# Patient Record
Sex: Female | Born: 1935 | Race: White | Hispanic: No | Smoking: Never smoker
Health system: Southern US, Community
[De-identification: ages and names within clinical notes are randomized; demographics above are authoritative.]

## PROBLEM LIST (undated history)

## (undated) DIAGNOSIS — H409 Unspecified glaucoma: Secondary | ICD-10-CM

## (undated) DIAGNOSIS — C569 Malignant neoplasm of unspecified ovary: Secondary | ICD-10-CM

## (undated) DIAGNOSIS — C801 Malignant (primary) neoplasm, unspecified: Secondary | ICD-10-CM

## (undated) DIAGNOSIS — R112 Nausea with vomiting, unspecified: Secondary | ICD-10-CM

## (undated) DIAGNOSIS — K219 Gastro-esophageal reflux disease without esophagitis: Secondary | ICD-10-CM

## (undated) DIAGNOSIS — I10 Essential (primary) hypertension: Secondary | ICD-10-CM

## (undated) DIAGNOSIS — Z9889 Other specified postprocedural states: Secondary | ICD-10-CM

## (undated) DIAGNOSIS — F419 Anxiety disorder, unspecified: Secondary | ICD-10-CM

## (undated) DIAGNOSIS — E7800 Pure hypercholesterolemia, unspecified: Secondary | ICD-10-CM

## (undated) HISTORY — DX: Gastro-esophageal reflux disease without esophagitis: K21.9

## (undated) HISTORY — DX: Unspecified glaucoma: H40.9

## (undated) HISTORY — DX: Malignant neoplasm of unspecified ovary: C56.9

## (undated) HISTORY — DX: Pure hypercholesterolemia, unspecified: E78.00

## (undated) HISTORY — PX: HAND SURGERY: SHX662

## (undated) HISTORY — PX: REPLACEMENT TOTAL KNEE: SUR1224

## (undated) HISTORY — PX: ABDOMINAL HYSTERECTOMY: SHX81

## (undated) HISTORY — PX: BREAST BIOPSY: SHX20

---

## 2004-10-30 ENCOUNTER — Ambulatory Visit: Payer: Self-pay | Admitting: Internal Medicine

## 2005-09-12 ENCOUNTER — Ambulatory Visit: Payer: Self-pay | Admitting: Internal Medicine

## 2005-10-31 ENCOUNTER — Ambulatory Visit: Payer: Self-pay | Admitting: Internal Medicine

## 2006-05-20 ENCOUNTER — Emergency Department: Payer: Self-pay | Admitting: Emergency Medicine

## 2006-05-20 ENCOUNTER — Other Ambulatory Visit: Payer: Self-pay

## 2006-12-23 ENCOUNTER — Ambulatory Visit: Payer: Self-pay | Admitting: Internal Medicine

## 2007-05-15 ENCOUNTER — Ambulatory Visit: Payer: Self-pay | Admitting: Otolaryngology

## 2008-02-09 ENCOUNTER — Ambulatory Visit: Payer: Self-pay | Admitting: Internal Medicine

## 2008-07-29 ENCOUNTER — Ambulatory Visit: Payer: Self-pay | Admitting: Ophthalmology

## 2008-07-29 ENCOUNTER — Other Ambulatory Visit: Payer: Self-pay

## 2008-08-16 ENCOUNTER — Ambulatory Visit: Payer: Self-pay | Admitting: Ophthalmology

## 2008-10-20 ENCOUNTER — Ambulatory Visit: Payer: Self-pay | Admitting: Internal Medicine

## 2008-11-22 ENCOUNTER — Ambulatory Visit: Payer: Self-pay | Admitting: Ophthalmology

## 2009-02-10 ENCOUNTER — Ambulatory Visit: Payer: Self-pay | Admitting: Internal Medicine

## 2010-02-13 ENCOUNTER — Ambulatory Visit: Payer: Self-pay | Admitting: Internal Medicine

## 2010-04-24 ENCOUNTER — Ambulatory Visit: Payer: Self-pay | Admitting: Orthopedic Surgery

## 2010-05-02 ENCOUNTER — Inpatient Hospital Stay: Payer: Self-pay | Admitting: Orthopedic Surgery

## 2010-05-07 ENCOUNTER — Encounter: Payer: Self-pay | Admitting: Internal Medicine

## 2010-05-19 ENCOUNTER — Encounter: Payer: Self-pay | Admitting: Internal Medicine

## 2010-06-12 ENCOUNTER — Emergency Department: Payer: Self-pay | Admitting: Emergency Medicine

## 2011-02-27 ENCOUNTER — Ambulatory Visit: Payer: Self-pay | Admitting: Internal Medicine

## 2012-04-28 ENCOUNTER — Ambulatory Visit: Payer: Self-pay | Admitting: Internal Medicine

## 2012-09-26 ENCOUNTER — Ambulatory Visit: Payer: Self-pay | Admitting: Internal Medicine

## 2013-05-18 ENCOUNTER — Ambulatory Visit: Payer: Self-pay | Admitting: Internal Medicine

## 2014-06-07 ENCOUNTER — Ambulatory Visit: Payer: Self-pay | Admitting: Internal Medicine

## 2014-08-17 DIAGNOSIS — R221 Localized swelling, mass and lump, neck: Secondary | ICD-10-CM

## 2014-08-17 DIAGNOSIS — R220 Localized swelling, mass and lump, head: Secondary | ICD-10-CM | POA: Insufficient documentation

## 2015-06-20 DIAGNOSIS — C569 Malignant neoplasm of unspecified ovary: Secondary | ICD-10-CM

## 2015-06-20 HISTORY — DX: Malignant neoplasm of unspecified ovary: C56.9

## 2015-07-01 ENCOUNTER — Other Ambulatory Visit: Payer: Self-pay | Admitting: Internal Medicine

## 2015-07-01 DIAGNOSIS — R1030 Lower abdominal pain, unspecified: Secondary | ICD-10-CM

## 2015-07-04 ENCOUNTER — Ambulatory Visit
Admission: RE | Admit: 2015-07-04 | Discharge: 2015-07-04 | Disposition: A | Discharge status: Home / Self Care | Payer: Medicare Other | Source: Ambulatory Visit | Attending: Internal Medicine | Admitting: Internal Medicine

## 2015-07-04 DIAGNOSIS — R1030 Lower abdominal pain, unspecified: Secondary | ICD-10-CM

## 2015-07-04 DIAGNOSIS — N8320 Unspecified ovarian cysts: Secondary | ICD-10-CM | POA: Diagnosis not present

## 2015-07-11 ENCOUNTER — Telehealth: Payer: Self-pay | Admitting: *Deleted

## 2015-07-11 NOTE — Telephone Encounter (Signed)
Patient called regarding "what to expect at her appointment with Dr. Theora Gianotti."  Patient reassured. Explained that this appointment, md will perform an internal pelvic exam and consultation." pt states that she is "going out of town on Thursday for two days and was worried that the doctor would be planning surgery that soon." pt reassured that she can still keep her plans for her trip at this time. Pt grateful for the information and reassurance.

## 2015-07-13 ENCOUNTER — Encounter
Admission: RE | Admit: 2015-07-13 | Discharge: 2015-07-13 | Disposition: A | Discharge status: Home / Self Care | Payer: Medicare Other | Source: Ambulatory Visit | Attending: Obstetrics and Gynecology | Admitting: Obstetrics and Gynecology

## 2015-07-13 ENCOUNTER — Encounter: Payer: Self-pay | Admitting: Obstetrics and Gynecology

## 2015-07-13 ENCOUNTER — Other Ambulatory Visit: Payer: Self-pay

## 2015-07-13 ENCOUNTER — Ambulatory Visit
Admission: RE | Admit: 2015-07-13 | Discharge: 2015-07-13 | Disposition: A | Discharge status: Home / Self Care | Payer: Medicare Other | Source: Ambulatory Visit | Attending: Obstetrics and Gynecology | Admitting: Obstetrics and Gynecology

## 2015-07-13 ENCOUNTER — Inpatient Hospital Stay: Payer: Medicare Other | Attending: Obstetrics and Gynecology | Admitting: Obstetrics and Gynecology

## 2015-07-13 VITALS — BP 118/80 | HR 81 | Temp 98.0°F | Ht 59.0 in | Wt 129.7 lb

## 2015-07-13 DIAGNOSIS — Z01810 Encounter for preprocedural cardiovascular examination: Secondary | ICD-10-CM | POA: Insufficient documentation

## 2015-07-13 DIAGNOSIS — R1909 Other intra-abdominal and pelvic swelling, mass and lump: Secondary | ICD-10-CM | POA: Insufficient documentation

## 2015-07-13 DIAGNOSIS — N839 Noninflammatory disorder of ovary, fallopian tube and broad ligament, unspecified: Secondary | ICD-10-CM | POA: Diagnosis not present

## 2015-07-13 DIAGNOSIS — N838 Other noninflammatory disorders of ovary, fallopian tube and broad ligament: Secondary | ICD-10-CM

## 2015-07-13 DIAGNOSIS — Z79899 Other long term (current) drug therapy: Secondary | ICD-10-CM | POA: Insufficient documentation

## 2015-07-13 DIAGNOSIS — Z01818 Encounter for other preprocedural examination: Secondary | ICD-10-CM

## 2015-07-13 DIAGNOSIS — Z90710 Acquired absence of both cervix and uterus: Secondary | ICD-10-CM | POA: Insufficient documentation

## 2015-07-13 HISTORY — DX: Other specified postprocedural states: Z98.890

## 2015-07-13 HISTORY — DX: Nausea with vomiting, unspecified: R11.2

## 2015-07-13 LAB — COMPREHENSIVE METABOLIC PANEL
ALK PHOS: 63 U/L (ref 38–126)
ALT: 13 U/L — AB (ref 14–54)
ANION GAP: 10 (ref 5–15)
AST: 26 U/L (ref 15–41)
Albumin: 4.5 g/dL (ref 3.5–5.0)
BILIRUBIN TOTAL: 0.9 mg/dL (ref 0.3–1.2)
BUN: 16 mg/dL (ref 6–20)
CALCIUM: 9.8 mg/dL (ref 8.9–10.3)
CO2: 28 mmol/L (ref 22–32)
CREATININE: 0.79 mg/dL (ref 0.44–1.00)
Chloride: 100 mmol/L — ABNORMAL LOW (ref 101–111)
Glucose, Bld: 100 mg/dL — ABNORMAL HIGH (ref 65–99)
Potassium: 4 mmol/L (ref 3.5–5.1)
SODIUM: 138 mmol/L (ref 135–145)
TOTAL PROTEIN: 8.6 g/dL — AB (ref 6.5–8.1)

## 2015-07-13 LAB — CBC
HCT: 39.2 % (ref 35.0–47.0)
HEMOGLOBIN: 12.5 g/dL (ref 12.0–16.0)
MCH: 28 pg (ref 26.0–34.0)
MCHC: 31.9 g/dL — AB (ref 32.0–36.0)
MCV: 87.7 fL (ref 80.0–100.0)
Platelets: 256 10*3/uL (ref 150–440)
RBC: 4.48 MIL/uL (ref 3.80–5.20)
RDW: 13.9 % (ref 11.5–14.5)
WBC: 10.1 10*3/uL (ref 3.6–11.0)

## 2015-07-13 LAB — TYPE AND SCREEN
ABO/RH(D): O POS
ANTIBODY SCREEN: NEGATIVE

## 2015-07-13 NOTE — Progress Notes (Signed)
Gynecologic Oncology Consult Visit   Referring Provider: Dr. Hall Busing  Chief Concern: ovarian cyst  Subjective:  Kathryn Chandler is a 81 y.o. female who is seen in consultation from Dr. Hall Busing for ovarian mass. She presented with abdominal pain.    Ultrasound 07/04/2015  FINDINGS: Uterus:Prior hysterectomy. Right ovary: Not visualized. Left ovary: There is a large septated cystic left adnexal mass measuring 12.8 x 9.6 x 13.9 cm. There is a 15 mm mural nodule. There is Doppler flow in 1 of the septations. There is no pelvic free fluid.  Pertinent positive ROS includes change in stool caliber and form since July. She had a colonoscopy years ago which she says was normal. I was not able to locate the report, but it appears that the procedure may have been done 15 years ago.   06/07/2014 Mammogram IMPRESSION: No mammographic evidence of malignancy. A result letter of this screening mammogram will be mailed directly to the patient.   Problem List: Patient Active Problem List   Diagnosis Date Noted  . Ovarian mass, left 07/13/2015    Past Medical History: Past Medical History  Diagnosis Date  . Glaucoma     Past Surgical History: Past Surgical History  Procedure Laterality Date  . Abdominal hysterectomy    . Replacement total knee      Right  . Breast biopsy    . Hand surgery      Right    Past Gynecologic History:  Menopause: 1976    OB History:  OB History  Gravida Para Term Preterm AB SAB TAB Ectopic Multiple Living  3 1   2 2         # Outcome Date GA Lbr Len/2nd Weight Sex Delivery Anes PTL Lv  3 Para           2 SAB           1 SAB               Family History: History reviewed. No pertinent family history.  Social History: Social History   Social History  . Marital Status: Married    Spouse Name: N/A  . Number of Children: N/A  . Years of Education: N/A   Occupational History  .      Retired   Social History Main Topics  . Smoking status: Not on  file  . Smokeless tobacco: Not on file  . Alcohol Use: Not on file  . Drug Use: Not on file  . Sexual Activity: Not on file   Other Topics Concern  . Not on file   Social History Narrative    Allergies: Allergies  Allergen Reactions  . Clindamycin/Lincomycin Diarrhea  . Codeine Anxiety    Current Medications: Current Outpatient Prescriptions  Medication Sig Dispense Refill  . acetaminophen (TYLENOL) 500 MG tablet Take by mouth.    . Ascorbic Acid (VITAMIN C CR) 500 MG CPCR Take by mouth.    . bismuth subsalicylate (PEPTO BISMOL) 262 MG chewable tablet Chew by mouth.    . fluticasone (FLONASE) 50 MCG/ACT nasal spray Place into the nose.    Marland Kitchen LORazepam (ATIVAN) 0.5 MG tablet Take by mouth.    . LUMIGAN 0.01 % SOLN     . meloxicam (MOBIC) 15 MG tablet Take by mouth.    Marland Kitchen omeprazole (PRILOSEC) 20 MG capsule Take by mouth.    . simvastatin (ZOCOR) 20 MG tablet Take by mouth.     No current facility-administered medications for this visit.  Review of Systems General: Weight loss  HEENT: no complaints  Lungs: no complaints  Cardiac: no complaints  GI: no complaints  GU: no complaints  Musculoskeletal: no complaints  Extremities: no complaints  Skin: no complaints  Neuro: no complaints  Endocrine: no complaints  Psych: no complaints      Objective:  Physical Examination:  BP 118/80 mmHg  Pulse 81  Temp(Src) 98 F (36.7 C) (Oral)  Ht 4\' 11"  (1.499 m)  Wt 129 lb 11.9 oz (58.85 kg)  BMI 26.19 kg/m2   ECOG Performance Status: 0 - Asymptomatic  General appearance: alert, cooperative and appears stated age HEENT:PERRLA, extra ocular movement intact and sclera clear, anicteric Lymph node survey: non-palpable, axillary, inguinal, supraclavicular Cardiovascular: regular rate and rhythm Respiratory: normal air entry, lungs clear to auscultation Breast exam: breasts appear normal, no suspicious masses, no skin or nipple changes or axillary nodes. Abdomen: soft,  protuberant, normal bowel sounds, mass located in the LLQ with extension to the right, no hepatosplenomegaly, no hernias and well healed incision Back: inspection of back is normal Extremities: extremities normal, atraumatic, no cyanosis or edema Neurological exam reveals alert, oriented, normal speech, no focal findings or movement disorder noted.  Pelvic: exam chaperoned by nurse;  Vulva: normal appearing vulva with no masses, tenderness or lesions; Vagina: normal vagina; Adnexa: mass present left side, size 12 cm; Uterus/cervix: surgically absent, vaginal cuff well healed; Rectal: confirmatory. Stool present no evidence of bowel involvement with the mass.   Lab Review Labs ordered   Radiologic Imaging: Reviewed films personally    Assessment:  Kathryn Chandler is a 81 y.o. female diagnosed with symptomatic complext adnexal mass concerning for malignancy. Medical co-morbidities complicating care: prior abdominal surgery.  Plan:   Problem List Items Addressed This Visit      Other   Ovarian mass, left - Primary      We discussed options for management including and I recommend definitive surgical evaluation. I will order her CEA, CA125, and HE4 in addition to her other studies (CXR and EKG).   Risks were discussed in detail. These include infection, anesthesia, bleeding, transfusion, wound separation, medical issues (blood clots, stroke, heart attack, fluid in the lungs, pneumonia, abnormal heart rhythm, death), possible exploratory surgery with larger incision, lymphedema, lymphocyst, allergic reaction.  Suggested return to clinic in  4 weeks.    The patient's diagnosis, an outline of the further diagnostic and laboratory studies which will be required, the recommendation, and alternatives were discussed.  All questions were answered to the patient's satisfaction.  A total of 120 minutes combined were spent with the patient/family today; 40% was spent in education, counseling and  coordination of care for complex adnexal mass.    Gillis Ends, MD    CC:  Larey Days, MD  Albina Billet, MD [address]   [place], [place] [address] [phone]

## 2015-07-13 NOTE — Addendum Note (Signed)
Addended by: Gillis Ends on: 07/13/2015 10:48 AM   Modules accepted: Orders

## 2015-07-13 NOTE — H&P (Signed)
Gynecologic Oncology Consult Visit   Referring Provider: Dr. Hall Busing  Chief Concern: ovarian cyst  Subjective:  Kathryn Chandler is a 81 y.o. female who is seen in consultation from Dr. Hall Busing for ovarian mass. She presented with abdominal pain.    Ultrasound 07/04/2015  FINDINGS: Uterus:Prior hysterectomy. Right ovary: Not visualized. Left ovary: There is a large septated cystic left adnexal mass measuring 12.8 x 9.6 x 13.9 cm. There is a 15 mm mural nodule. There is Doppler flow in 1 of the septations. There is no pelvic free fluid.  Pertinent positive ROS includes change in stool caliber and form since July. She had a colonoscopy years ago which she says was normal. I was not able to locate the report, but it appears that the procedure may have been done 15 years ago.   06/07/2014 Mammogram IMPRESSION: No mammographic evidence of malignancy. A result letter of this screening mammogram will be mailed directly to the patient.   Problem List: Patient Active Problem List   Diagnosis Date Noted  . Ovarian mass, left 07/13/2015    Past Medical History: Past Medical History  Diagnosis Date  . Glaucoma     Past Surgical History: Past Surgical History  Procedure Laterality Date  . Abdominal hysterectomy    . Replacement total knee      Right  . Breast biopsy    . Hand surgery      Right    Past Gynecologic History:  Menopause: 1976    OB History:  OB History  Gravida Para Term Preterm AB SAB TAB Ectopic Multiple Living  3 1   2 2         # Outcome Date GA Lbr Len/2nd Weight Sex Delivery Anes PTL Lv  3 Para           2 SAB           1 SAB               Family History: History reviewed. No pertinent family history.  Social History: Social History   Social History  . Marital Status: Married    Spouse Name: N/A  . Number of Children: N/A  .  Years of Education: N/A   Occupational History  .      Retired   Social History Main Topics  . Smoking status: Not on file  . Smokeless tobacco: Not on file  . Alcohol Use: Not on file  . Drug Use: Not on file  . Sexual Activity: Not on file   Other Topics Concern  . Not on file   Social History Narrative    Allergies: Allergies  Allergen Reactions  . Clindamycin/Lincomycin Diarrhea  . Codeine Anxiety    Current Medications: Current Outpatient Prescriptions  Medication Sig Dispense Refill  . acetaminophen (TYLENOL) 500 MG tablet Take by mouth.    . Ascorbic Acid (VITAMIN C CR) 500 MG CPCR Take by mouth.    . bismuth subsalicylate (PEPTO BISMOL) 262 MG chewable tablet Chew by mouth.    . fluticasone (FLONASE) 50 MCG/ACT nasal spray Place into the nose.    Marland Kitchen LORazepam (ATIVAN) 0.5 MG tablet Take by mouth.    . LUMIGAN 0.01 % SOLN     . meloxicam (MOBIC) 15 MG tablet Take by mouth.    Marland Kitchen omeprazole (PRILOSEC) 20 MG capsule Take by mouth.    . simvastatin (ZOCOR) 20 MG tablet Take by mouth.     No current facility-administered medications for this visit.  Review of Systems General: Weight loss  HEENT: no complaints  Lungs: no complaints  Cardiac: no complaints  GI: no complaints  GU: no complaints  Musculoskeletal: no complaints  Extremities: no complaints  Skin: no complaints  Neuro: no complaints  Endocrine: no complaints  Psych: no complaints      Objective:  Physical Examination:  BP 118/80 mmHg  Pulse 81  Temp(Src) 98 F (36.7 C) (Oral)  Ht 4\' 11"  (1.499 m)  Wt 129 lb 11.9 oz (58.85 kg)  BMI 26.19 kg/m2  ECOG Performance Status: 0 - Asymptomatic  General appearance: alert, cooperative and appears stated age HEENT:PERRLA, extra ocular movement intact and sclera clear, anicteric Lymph node survey: non-palpable, axillary,  inguinal, supraclavicular Cardiovascular: regular rate and rhythm Respiratory: normal air entry, lungs clear to auscultation Breast exam: breasts appear normal, no suspicious masses, no skin or nipple changes or axillary nodes. Abdomen: soft, protuberant, normal bowel sounds, mass located in the LLQ with extension to the right, no hepatosplenomegaly, no hernias and well healed incision Back: inspection of back is normal Extremities: extremities normal, atraumatic, no cyanosis or edema Neurological exam reveals alert, oriented, normal speech, no focal findings or movement disorder noted.  Pelvic: exam chaperoned by nurse; Vulva: normal appearing vulva with no masses, tenderness or lesions; Vagina: normal vagina; Adnexa: mass present left side, size 12 cm; Uterus/cervix: surgically absent, vaginal cuff well healed; Rectal: confirmatory. Stool present no evidence of bowel involvement with the mass.   Lab Review Labs ordered   Radiologic Imaging: Reviewed films personally  Assessment:  Kathryn Chandler is a 81 y.o. female diagnosed with symptomatic complext adnexal mass concerning for malignancy. Medical co-morbidities complicating care: prior abdominal surgery.  Plan:   Problem List Items Addressed This Visit      Other   Ovarian mass, left - Primary      We discussed options for management including and I recommend definitive surgical evaluation. I will order her CEA, CA125, and HE4 in addition to her other studies (CXR and EKG).   Risks were discussed in detail. These include infection, anesthesia, bleeding, transfusion, wound separation, medical issues (blood clots, stroke, heart attack, fluid in the lungs, pneumonia, abnormal heart rhythm, death), possible exploratory surgery with larger incision, lymphedema, lymphocyst, allergic reaction.  Suggested return to clinic in 4 weeks.   The patient's diagnosis, an outline of the further diagnostic and laboratory studies  which will be required, the recommendation, and alternatives were discussed. All questions were answered to the patient's satisfaction.  A total of 120 minutes combined were spent with the patient/family today; 40% was spent in education, counseling and coordination of care for complex adnexal mass.   Gillis Ends, MD    CC:  Larey Days, MD  Albina Billet, MD [address] [place], [place] [address] [phone]

## 2015-07-13 NOTE — Patient Instructions (Signed)
  Your procedure is scheduled on: Wednesday 07/20/2015 Report to Day Surgery. 2ND FLOOR MEDICAL MALL ENTRANCE To find out your arrival time please call [phone] between 1PM - 3PM on Tuesday 07/19/2015.  Remember: Instructions that are not followed completely may result in serious medical risk, up to and including death, or upon the discretion of your surgeon and anesthesiologist your surgery may need to be rescheduled.    __X__ 1. Do not eat food or drink liquids after midnight. No gum chewing or hard candies.     __X__ 2. No Alcohol for 24 hours before or after surgery.   ____ 3. Bring all medications with you on the day of surgery if instructed.    __X__ 4. Notify your doctor if there is any change in your medical condition     (cold, fever, infections).     Do not wear jewelry, make-up, hairpins, clips or nail polish.  Do not wear lotions, powders, or perfumes.   Do not shave 48 hours prior to surgery. Men may shave face and neck.  Do not bring valuables to the hospital.    [facility] is not responsible for any belongings or valuables.               Contacts, dentures or bridgework may not be worn into surgery.  Leave your suitcase in the car. After surgery it may be brought to your room.  For patients admitted to the hospital, discharge time is determined by your                treatment team.   Patients discharged the day of surgery will not be allowed to drive home.   Please read over the following fact sheets that you were given:   Surgical Site Infection Prevention   ____ Take these medicines the morning of surgery with A SIP OF WATER:    1. LORAZEPAM  2. OMEPRAZOLE  3.   4.  5.  6.  ____ Fleet Enema (as directed)   __X__ Use CHG Soap as directed  ____ Use inhalers on the day of surgery  ____ Stop metformin 2 days prior to surgery    ____ Take 1/2 of usual insulin dose the night before surgery and none on the morning of surgery.   ____ Stop  Coumadin/Plavix/aspirin on   __X__ Stop Anti-inflammatories on 07/14/2015   __X__ Stop supplements until after surgery.  VITAMIN C, NO PEPTO BISMOL  ____ Bring C-Pap to the hospital.

## 2015-07-14 ENCOUNTER — Ambulatory Visit: Payer: Medicare (Managed Care) | Admitting: Oncology

## 2015-07-14 ENCOUNTER — Telehealth: Payer: Self-pay | Admitting: *Deleted

## 2015-07-14 LAB — ABO/RH: ABO/RH(D): O POS

## 2015-07-14 NOTE — Telephone Encounter (Signed)
On chart review, RN noticed that patient did not get tumor markers drawn yesterday at preadmit testing per md order. I have left a msg with the patient's son-Kathryn Chandler to seek an alternative phone number for patient. Pt is out of town as she is going to [place], [place] for vacation. Ideally, would like the patient to come back to today for this lab draw, but that is not possible as patient is traveling. At the latest, the patient needs to come on Monday for lab draw.   Dr. Theora Gianotti notified that labs were not drawn as scheduled.

## 2015-07-18 ENCOUNTER — Inpatient Hospital Stay: Payer: Medicare Other

## 2015-07-18 ENCOUNTER — Inpatient Hospital Stay: Payer: Medicare Other | Admitting: Oncology

## 2015-07-18 DIAGNOSIS — N838 Other noninflammatory disorders of ovary, fallopian tube and broad ligament: Secondary | ICD-10-CM

## 2015-07-19 LAB — CA 125: CA 125: 74.2 U/mL — ABNORMAL HIGH (ref 0.0–38.1)

## 2015-07-19 LAB — CEA: CEA: 2.7 ng/mL (ref 0.0–4.7)

## 2015-07-20 ENCOUNTER — Encounter
Admission: AD | Disposition: A | Discharge status: Skilled Nursing Facility | Payer: Self-pay | Source: Ambulatory Visit | Attending: Obstetrics & Gynecology

## 2015-07-20 ENCOUNTER — Ambulatory Visit: Payer: Medicare Other | Admitting: Anesthesiology

## 2015-07-20 ENCOUNTER — Encounter: Payer: Self-pay | Admitting: *Deleted

## 2015-07-20 ENCOUNTER — Inpatient Hospital Stay
Admission: AD | Admit: 2015-07-20 | Discharge: 2015-07-24 | DRG: 738 | Disposition: A | Discharge status: Skilled Nursing Facility | Payer: Medicare Other | Source: Ambulatory Visit | Attending: Obstetrics & Gynecology | Admitting: Obstetrics & Gynecology

## 2015-07-20 DIAGNOSIS — R001 Bradycardia, unspecified: Secondary | ICD-10-CM | POA: Diagnosis not present

## 2015-07-20 DIAGNOSIS — E780 Pure hypercholesterolemia: Secondary | ICD-10-CM | POA: Diagnosis present

## 2015-07-20 DIAGNOSIS — H409 Unspecified glaucoma: Secondary | ICD-10-CM | POA: Diagnosis present

## 2015-07-20 DIAGNOSIS — C562 Malignant neoplasm of left ovary: Principal | ICD-10-CM

## 2015-07-20 DIAGNOSIS — K219 Gastro-esophageal reflux disease without esophagitis: Secondary | ICD-10-CM | POA: Diagnosis present

## 2015-07-20 DIAGNOSIS — C569 Malignant neoplasm of unspecified ovary: Secondary | ICD-10-CM | POA: Diagnosis present

## 2015-07-20 DIAGNOSIS — N736 Female pelvic peritoneal adhesions (postinfective): Secondary | ICD-10-CM | POA: Diagnosis present

## 2015-07-20 DIAGNOSIS — N838 Other noninflammatory disorders of ovary, fallopian tube and broad ligament: Secondary | ICD-10-CM

## 2015-07-20 HISTORY — PX: SALPINGOOPHORECTOMY: SHX82

## 2015-07-20 HISTORY — PX: LAPAROTOMY: SHX154

## 2015-07-20 LAB — CBC
HCT: 35.7 % (ref 35.0–47.0)
HEMOGLOBIN: 11.3 g/dL — AB (ref 12.0–16.0)
MCH: 27.7 pg (ref 26.0–34.0)
MCHC: 31.6 g/dL — ABNORMAL LOW (ref 32.0–36.0)
MCV: 87.8 fL (ref 80.0–100.0)
Platelets: 214 10*3/uL (ref 150–440)
RBC: 4.06 MIL/uL (ref 3.80–5.20)
RDW: 14 % (ref 11.5–14.5)
WBC: 13.9 10*3/uL — ABNORMAL HIGH (ref 3.6–11.0)

## 2015-07-20 LAB — CREATININE, SERUM: CREATININE: 0.69 mg/dL (ref 0.44–1.00)

## 2015-07-20 SURGERY — LAPAROTOMY
Anesthesia: General | Site: Abdomen | Laterality: Right | Wound class: Clean

## 2015-07-20 MED ORDER — CEFAZOLIN SODIUM-DEXTROSE 2-3 GM-% IV SOLR
2.0000 g | INTRAVENOUS | Status: AC
  Administered 2015-07-20: 2 g via INTRAVENOUS

## 2015-07-20 MED ORDER — PANTOPRAZOLE SODIUM 40 MG PO TBEC
40.0000 mg | DELAYED_RELEASE_TABLET | Freq: Every day | ORAL | Status: DC
  Administered 2015-07-21 – 2015-07-23 (×3): 40 mg via ORAL
  Filled 2015-07-20 (×3): qty 1

## 2015-07-20 MED ORDER — NEOSTIGMINE METHYLSULFATE 10 MG/10ML IV SOLN
INTRAVENOUS | Status: DC | PRN
  Administered 2015-07-20: 2.5 mg via INTRAVENOUS

## 2015-07-20 MED ORDER — EPHEDRINE SULFATE 50 MG/ML IJ SOLN
INTRAMUSCULAR | Status: DC | PRN
  Administered 2015-07-20: 10 mg via INTRAVENOUS

## 2015-07-20 MED ORDER — OXYCODONE HCL 5 MG PO TABS: 10.0000 mg | ORAL_TABLET | ORAL | Status: DC | PRN

## 2015-07-20 MED ORDER — ENOXAPARIN SODIUM 40 MG/0.4ML ~~LOC~~ SOLN
40.0000 mg | SUBCUTANEOUS | Status: AC
  Administered 2015-07-20: 40 mg via SUBCUTANEOUS
  Filled 2015-07-20: qty 0.4

## 2015-07-20 MED ORDER — ONDANSETRON HCL 4 MG PO TABS: 4.0000 mg | ORAL_TABLET | Freq: Four times a day (QID) | ORAL | Status: DC | PRN

## 2015-07-20 MED ORDER — LORAZEPAM 0.5 MG PO TABS
0.5000 mg | ORAL_TABLET | Freq: Two times a day (BID) | ORAL | Status: DC
  Administered 2015-07-20 – 2015-07-24 (×8): 0.5 mg via ORAL
  Filled 2015-07-20 (×8): qty 1

## 2015-07-20 MED ORDER — METOCLOPRAMIDE HCL 5 MG/ML IJ SOLN: 10.0000 mg | Freq: Once | INTRAMUSCULAR | Status: DC | PRN

## 2015-07-20 MED ORDER — ROCURONIUM BROMIDE 100 MG/10ML IV SOLN
INTRAVENOUS | Status: DC | PRN
  Administered 2015-07-20: 40 mg via INTRAVENOUS
  Administered 2015-07-20: 10 mg via INTRAVENOUS

## 2015-07-20 MED ORDER — GLYCOPYRROLATE 0.2 MG/ML IJ SOLN
INTRAMUSCULAR | Status: DC | PRN
  Administered 2015-07-20: 0.4 mg via INTRAVENOUS
  Administered 2015-07-20: .4 mg via INTRAVENOUS

## 2015-07-20 MED ORDER — ACETAMINOPHEN 10 MG/ML IV SOLN
INTRAVENOUS | Status: DC | PRN
  Administered 2015-07-20: 1000 mg via INTRAVENOUS

## 2015-07-20 MED ORDER — BUPIVACAINE HCL (PF) 0.5 % IJ SOLN
INTRAMUSCULAR | Status: AC
  Filled 2015-07-20: qty 30

## 2015-07-20 MED ORDER — LACTATED RINGERS IV SOLN
INTRAVENOUS | Status: DC
  Administered 2015-07-20 (×3): via INTRAVENOUS

## 2015-07-20 MED ORDER — METHYLENE BLUE 1 % INJ SOLN
INTRAMUSCULAR | Status: AC
  Filled 2015-07-20: qty 10

## 2015-07-20 MED ORDER — FENTANYL CITRATE (PF) 100 MCG/2ML IJ SOLN
25.0000 ug | INTRAMUSCULAR | Status: DC | PRN
  Administered 2015-07-20 (×4): 25 ug via INTRAVENOUS

## 2015-07-20 MED ORDER — SIMETHICONE 80 MG PO CHEW: 80.0000 mg | CHEWABLE_TABLET | Freq: Four times a day (QID) | ORAL | Status: DC | PRN

## 2015-07-20 MED ORDER — SCOPOLAMINE 1 MG/3DAYS TD PT72: 1.0000 | MEDICATED_PATCH | TRANSDERMAL | Status: DC | PRN

## 2015-07-20 MED ORDER — IBUPROFEN 600 MG PO TABS
600.0000 mg | ORAL_TABLET | Freq: Four times a day (QID) | ORAL | Status: DC
Start: 2015-07-20 — End: 2015-07-24
  Administered 2015-07-20 – 2015-07-24 (×14): 600 mg via ORAL
  Filled 2015-07-20 (×15): qty 1

## 2015-07-20 MED ORDER — HYDROMORPHONE HCL 1 MG/ML IJ SOLN
INTRAMUSCULAR | Status: AC
  Filled 2015-07-20: qty 1

## 2015-07-20 MED ORDER — PROPOFOL 10 MG/ML IV BOLUS
INTRAVENOUS | Status: DC | PRN
  Administered 2015-07-20: 80 mg via INTRAVENOUS

## 2015-07-20 MED ORDER — FENTANYL CITRATE (PF) 100 MCG/2ML IJ SOLN
INTRAMUSCULAR | Status: AC
  Filled 2015-07-20: qty 2

## 2015-07-20 MED ORDER — SODIUM CHLORIDE 0.9 % IV SOLN
INTRAVENOUS | Status: DC
  Administered 2015-07-20: 20:00:00 via INTRAVENOUS

## 2015-07-20 MED ORDER — ACETAMINOPHEN 325 MG PO TABS
650.0000 mg | ORAL_TABLET | Freq: Four times a day (QID) | ORAL | Status: DC
  Administered 2015-07-20 – 2015-07-24 (×11): 650 mg via ORAL
  Filled 2015-07-20 (×10): qty 2

## 2015-07-20 MED ORDER — PHENYLEPHRINE HCL 10 MG/ML IJ SOLN
INTRAMUSCULAR | Status: DC | PRN
  Administered 2015-07-20: 100 ug via INTRAVENOUS

## 2015-07-20 MED ORDER — LIDOCAINE HCL (CARDIAC) 20 MG/ML IV SOLN
INTRAVENOUS | Status: DC | PRN
  Administered 2015-07-20: 60 mg via INTRAVENOUS

## 2015-07-20 MED ORDER — CEFAZOLIN SODIUM-DEXTROSE 2-3 GM-% IV SOLR
INTRAVENOUS | Status: AC
  Filled 2015-07-20: qty 50

## 2015-07-20 MED ORDER — OXYCODONE HCL 5 MG PO TABS: 5.0000 mg | ORAL_TABLET | ORAL | Status: DC | PRN

## 2015-07-20 MED ORDER — TRAMADOL HCL 50 MG PO TABS
50.0000 mg | ORAL_TABLET | Freq: Four times a day (QID) | ORAL | Status: DC
  Administered 2015-07-20 – 2015-07-24 (×15): 50 mg via ORAL
  Filled 2015-07-20 (×15): qty 1

## 2015-07-20 MED ORDER — DEXAMETHASONE SODIUM PHOSPHATE 4 MG/ML IJ SOLN
INTRAMUSCULAR | Status: DC | PRN
  Administered 2015-07-20: 10 mg via INTRAVENOUS

## 2015-07-20 MED ORDER — MENTHOL 3 MG MT LOZG
1.0000 | LOZENGE | OROMUCOSAL | Status: DC | PRN
  Filled 2015-07-20: qty 9

## 2015-07-20 MED ORDER — FENTANYL CITRATE (PF) 100 MCG/2ML IJ SOLN
INTRAMUSCULAR | Status: DC | PRN
  Administered 2015-07-20 (×3): 50 ug via INTRAVENOUS
  Administered 2015-07-20 (×2): 25 ug via INTRAVENOUS

## 2015-07-20 MED ORDER — ACETAMINOPHEN 10 MG/ML IV SOLN
INTRAVENOUS | Status: AC
Start: 2015-07-20 — End: 2015-07-20
  Filled 2015-07-20: qty 100

## 2015-07-20 MED ORDER — ONDANSETRON HCL 4 MG/2ML IJ SOLN
INTRAMUSCULAR | Status: DC | PRN
  Administered 2015-07-20: 4 mg via INTRAVENOUS

## 2015-07-20 MED ORDER — KETAMINE HCL 10 MG/ML IJ SOLN
INTRAMUSCULAR | Status: DC | PRN
  Administered 2015-07-20: 15 mg via INTRAVENOUS
  Administered 2015-07-20: 30 mg via INTRAVENOUS

## 2015-07-20 MED ORDER — HYDROMORPHONE HCL 1 MG/ML IJ SOLN
0.5000 mg | INTRAMUSCULAR | Status: DC | PRN
  Administered 2015-07-20 (×2): 0.5 mg via INTRAVENOUS

## 2015-07-20 MED ORDER — ONDANSETRON HCL 4 MG/2ML IJ SOLN
4.0000 mg | Freq: Four times a day (QID) | INTRAMUSCULAR | Status: DC | PRN
  Administered 2015-07-20: 4 mg via INTRAVENOUS
  Filled 2015-07-20: qty 2

## 2015-07-20 MED ORDER — LATANOPROST 0.005 % OP SOLN
1.0000 [drp] | Freq: Every day | OPHTHALMIC | Status: DC
  Administered 2015-07-20 – 2015-07-23 (×4): 1 [drp] via OPHTHALMIC
  Filled 2015-07-20: qty 2.5

## 2015-07-20 MED ORDER — HYDROMORPHONE HCL 1 MG/ML IJ SOLN: 0.5000 mg | INTRAMUSCULAR | Status: DC | PRN

## 2015-07-20 MED ORDER — ENOXAPARIN SODIUM 40 MG/0.4ML ~~LOC~~ SOLN
40.0000 mg | SUBCUTANEOUS | Status: DC
Start: 2015-07-21 — End: 2015-07-24
  Administered 2015-07-21 – 2015-07-23 (×3): 40 mg via SUBCUTANEOUS
  Filled 2015-07-20 (×3): qty 0.4

## 2015-07-20 SURGICAL SUPPLY — 84 items
BAG URO DRAIN 2000ML W/SPOUT (MISCELLANEOUS) ×6 IMPLANT
BARRIER ADH SEPRAFILM 4 3X5 63 (MISCELLANEOUS) ×6 IMPLANT
BLADE SURG SZ11 CARB STEEL (BLADE) ×6 IMPLANT
BLADE SURG SZ12 CARB STEEL (BLADE) ×12 IMPLANT
CANISTER SUCT 1200ML W/VALVE (MISCELLANEOUS) ×12 IMPLANT
CATH FOLEY 2WAY  5CC 16FR (CATHETERS) ×4
CATH ROBINSON RED A/P 16FR (CATHETERS) IMPLANT
CATH TRAY 16F METER LATEX (MISCELLANEOUS) IMPLANT
CATH URTH 16FR FL 2W BLN LF (CATHETERS) ×8 IMPLANT
CHLORAPREP W/TINT 26ML (MISCELLANEOUS) ×12 IMPLANT
CLEANER CAUTERY TIP 5X5 PAD (MISCELLANEOUS) ×4 IMPLANT
CNTNR SPEC 2.5X3XGRAD LEK (MISCELLANEOUS) ×4
CONT SPEC 4OZ STER OR WHT (MISCELLANEOUS) ×2
CONTAINER SPEC 2.5X3XGRAD LEK (MISCELLANEOUS) ×4 IMPLANT
DRAPE LAPAROTOMY 100X77 ABD (DRAPES) IMPLANT
DRAPE LEGGINS SURG 28X43 STRL (DRAPES) ×6 IMPLANT
DRAPE UNDER BUTTOCK W/FLU (DRAPES) ×12 IMPLANT
DRESSING SURGICEL FIBRLLR 1X2 (HEMOSTASIS) ×4 IMPLANT
DRESSING TELFA 4X3 1S ST N-ADH (GAUZE/BANDAGES/DRESSINGS) IMPLANT
DRSG OPSITE POSTOP 4X14 (GAUZE/BANDAGES/DRESSINGS) ×6 IMPLANT
DRSG SURGICEL FIBRILLAR 1X2 (HEMOSTASIS) ×6
DRSG TEGADERM 2-3/8X2-3/4 SM (GAUZE/BANDAGES/DRESSINGS) IMPLANT
DRSG TELFA 3X8 NADH (GAUZE/BANDAGES/DRESSINGS) ×6 IMPLANT
ELECT BLADE 6 FLAT ULTRCLN (ELECTRODE) ×6 IMPLANT
ELECT BLADE 6.5 EXT (BLADE) ×6 IMPLANT
ELECT CAUTERY BLADE 6.4 (BLADE) ×6 IMPLANT
ENDOPOUCH RETRIEVER 10 (MISCELLANEOUS) IMPLANT
GAUZE SPONGE 4X4 12PLY STRL (GAUZE/BANDAGES/DRESSINGS) ×6 IMPLANT
GAUZE SPONGE NON-WVN 2X2 STRL (MISCELLANEOUS) IMPLANT
GLOVE BIO SURGEON STRL SZ 6.5 (GLOVE) ×10 IMPLANT
GLOVE BIO SURGEONS STRL SZ 6.5 (GLOVE) ×2
GLOVE BIOGEL PI IND STRL 6.5 (GLOVE) ×16 IMPLANT
GLOVE BIOGEL PI INDICATOR 6.5 (GLOVE) ×8
GLOVE INDICATOR 7.0 STRL GRN (GLOVE) ×12 IMPLANT
GLOVE SURG SYN 6.5 ES PF (GLOVE) ×24 IMPLANT
GOWN STRL REUS W/ TWL LRG LVL3 (GOWN DISPOSABLE) ×28 IMPLANT
GOWN STRL REUS W/ TWL XL LVL3 (GOWN DISPOSABLE) ×4 IMPLANT
GOWN STRL REUS W/TWL LRG LVL3 (GOWN DISPOSABLE) ×14
GOWN STRL REUS W/TWL XL LVL3 (GOWN DISPOSABLE) ×2
GRASPER SUT TROCAR 14GX15 (MISCELLANEOUS) IMPLANT
HANDLE YANKAUER SUCT BULB TIP (MISCELLANEOUS) ×6 IMPLANT
IRRIGATION STRYKERFLOW (MISCELLANEOUS) IMPLANT
IRRIGATOR STRYKERFLOW (MISCELLANEOUS)
IV LACTATED RINGERS 1000ML (IV SOLUTION) ×6 IMPLANT
KIT RM TURNOVER CYSTO AR (KITS) ×12 IMPLANT
LABEL OR SOLS (LABEL) ×6 IMPLANT
LIGASURE 5MM LAPAROSCOPIC (INSTRUMENTS) IMPLANT
LIGASURE IMPACT 36 18CM CVD LR (INSTRUMENTS) ×6 IMPLANT
LIQUID BAND (GAUZE/BANDAGES/DRESSINGS) IMPLANT
MANIPULATOR UTERINE ZUMI 4.5 (MISCELLANEOUS) IMPLANT
NEEDLE VERESS 14GA 120MM (NEEDLE) IMPLANT
NS IRRIG 1000ML POUR BTL (IV SOLUTION) ×6 IMPLANT
NS IRRIG 500ML POUR BTL (IV SOLUTION) ×6 IMPLANT
PACK BASIN MAJOR ARMC (MISCELLANEOUS) IMPLANT
PACK GYN LAPAROSCOPIC (MISCELLANEOUS) ×6 IMPLANT
PACK LAP CHOLECYSTECTOMY (MISCELLANEOUS) IMPLANT
PAD CLEANER CAUTERY TIP 5X5 (MISCELLANEOUS) ×2
PAD GROUND ADULT SPLIT (MISCELLANEOUS) ×6 IMPLANT
PAD OB MATERNITY 4.3X12.25 (PERSONAL CARE ITEMS) ×12 IMPLANT
PAD PREP 24X41 OB/GYN DISP (PERSONAL CARE ITEMS) ×6 IMPLANT
PAD TRENDELENBURG OR TABLE (MISCELLANEOUS) ×6 IMPLANT
PENCIL ELECTRO HAND CTR (MISCELLANEOUS) ×6 IMPLANT
SCISSORS METZENBAUM CVD 33 (INSTRUMENTS) IMPLANT
SHEARS HARMONIC ACE PLUS 36CM (ENDOMECHANICALS) IMPLANT
SLEEVE ENDOPATH XCEL 5M (ENDOMECHANICALS) IMPLANT
SPONGE LAP 18X18 5 PK (GAUZE/BANDAGES/DRESSINGS) ×24 IMPLANT
SPONGE VERSALON 2X2 STRL (MISCELLANEOUS)
STAPLER SKIN PROX 35W (STAPLE) IMPLANT
SUT MAXON ABS #0 GS21 30IN (SUTURE) IMPLANT
SUT MNCRL AB 4-0 PS2 18 (SUTURE) IMPLANT
SUT PDS AB 1 TP1 96 (SUTURE) ×6 IMPLANT
SUT VIC AB 0 CT1 27 (SUTURE)
SUT VIC AB 0 CT1 27XCR 8 STRN (SUTURE) IMPLANT
SUT VIC AB 0 CT1 36 (SUTURE) IMPLANT
SUT VIC AB 2-0 UR6 27 (SUTURE) IMPLANT
SUT VIC AB 4-0 PS2 18 (SUTURE) IMPLANT
SUT VICRYL AB 3-0 FS1 BRD 27IN (SUTURE) IMPLANT
SYR BULB IRRIG 60ML STRL (SYRINGE) ×6 IMPLANT
SYRINGE 10CC LL (SYRINGE) ×12 IMPLANT
TROCAR ENDO BLADELESS 11MM (ENDOMECHANICALS) IMPLANT
TROCAR XCEL NON-BLD 5MMX100MML (ENDOMECHANICALS) IMPLANT
TUBING CONNECTING 10 (TUBING) ×5 IMPLANT
TUBING CONNECTING 10' (TUBING) ×1
TUBING INSUFFLATOR HI FLOW (MISCELLANEOUS) ×6 IMPLANT

## 2015-07-20 NOTE — Anesthesia Preprocedure Evaluation (Signed)
Anesthesia Evaluation  Patient identified by MRN, date of birth, ID band Patient awake    Reviewed: Allergy & Precautions, NPO status , Patient's Chart, lab work & pertinent test results  History of Anesthesia Complications (+) PONV  Airway Mallampati: II  TM Distance: >3 FB Neck ROM: Limited    Dental  (+) Teeth Intact   Pulmonary    Pulmonary exam normal       Cardiovascular negative cardio ROS Normal cardiovascular examRhythm:Regular Rate:Normal     Neuro/Psych    GI/Hepatic GERD-  Medicated and Controlled,  Endo/Other    Renal/GU      Musculoskeletal   Abdominal   Peds  Hematology   Anesthesia Other Findings   Reproductive/Obstetrics                             Anesthesia Physical Anesthesia Plan  ASA: III  Anesthesia Plan: General   Post-op Pain Management:    Induction: Intravenous  Airway Management Planned: Oral ETT  Additional Equipment:   Intra-op Plan:   Post-operative Plan: Extubation in OR  Informed Consent: I have reviewed the patients History and Physical, chart, labs and discussed the procedure including the risks, benefits and alternatives for the proposed anesthesia with the patient or authorized representative who has indicated his/her understanding and acceptance.     Plan Discussed with: CRNA  Anesthesia Plan Comments:         Anesthesia Quick Evaluation

## 2015-07-20 NOTE — Anesthesia Postprocedure Evaluation (Signed)
  Anesthesia Post-op Note  Patient: Kathryn Chandler  Procedure(s) Performed: Procedure(s): LAPAROTOMY (N/A) SALPINGO OOPHORECTOMY (Right) LAPAROTOMY with Left Salpingooophrectomy (Left)  Anesthesia type:General  Patient location: PACU  Post pain: Pain level controlled  Post assessment: Post-op Vital signs reviewed, Patient's Cardiovascular Status Stable, Respiratory Function Stable, Patent Airway and No signs of Nausea or vomiting  Post vital signs: Reviewed and stable  Last Vitals:  Filed Vitals:   07/20/15 1215  BP: 109/47  Pulse: 53  Temp: 35.9 C  Resp: 18    Level of consciousness: awake, alert  and patient cooperative  Complications: No apparent anesthesia complications

## 2015-07-20 NOTE — Op Note (Signed)
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   SURGEON: Surgeon(s) and Role: Panel 1:    *  Chelsea C Ward, MD- Primary   ASSISTANT: Deshante Cassell Gaetana Michaelis, MD  ANESTHESIA: General  PROCEDURE: Procedure(s): Exploratory LAPAROTOMY; left SALPINGO OOPHORECTOMY; lysis of pelvic adhesions; and left pelvic sidewall biopsy  ESTIMATED BLOOD LOSS: less than 100 mL  DRAINS: Foley   TOTAL IV FLUIDS: Per anesthesia  SPECIMENS:  Left fallopian tube and ovary, left pelvic sidewall biopsy  COMPLICATIONS: None  DISPOSITION: PACU - hemodynamically stable.  CONDITION: stable  INDICATIONS: The patient is pleasant 81 year old with symptomatic left adnexal mass and elevated CA-125.  FINDINGS: The exam under anesthesia revealed a large 14-16 cm mass that was fixed to the left sidewall and lower left pelvis. There did not seem to be rectal involvement. Decision is made to proceed with laparotomy. The findings at laparotomy included extensive disease with widespread involvement of bilateral diaphragmatic surfaces; an omental cake that extended to the spleen and close to the gastric wall; multiple implants involving a small large bowel and large bowel (she would required 3 bowel resections for optimal disease resection); and the large left ovarian cystic mass involving the rectosigmoid and the mesentery of the bowel as well as adhesions to the superior bladder. In addition, a loop of small bowel was adherent to the right lower quadrant and the right ovary and tube were not visualized. At the termination procedure all the disease is remaining except for at the left ovary and tube as well as the pelvic sidewall biopsy. Attempt at debulking was not made due to the extensive nature of her disease and Fagotti score greater than 8. The left salpingo-oophorectomy  was performed for palliative given the symptomatic large mass and for diagnosis.  Fagotti Score: Massive peritoneal involvement and/or a miliary pattern of distribution forperitoneal carcinomatosis(score 2); wide spread infiltrating carcinomatosis,and/or confluent nodules to the most part of thediaphragmatic surface(score 2); large infiltrating nodules and/or an involvement of the root of themesenterysupposed on the basis of limited movements of the various intestinal segments (score 2); tumor diffusion along theomentumup to the large stomach curvature (score 0);possible large/smallbowelresection (excluding recto-sigmoid\resection) and/or extended carcinomatosis on the ansae (score 2);obvious neoplastic involvement of thegastricwall (score 0); and liversurface lesions larger than 2 cm (score 0). Total score = 8 .    PROCEDURE IN DETAIL: After informed consent was obtained, the patient was taken to the operating room where general endotracheal anesthesia was performed without difficulty. The patient was positioned in the dorsal lithotomy position in [place] and the usual precautions taken with her arms in arm boards bilaterally. She was prepped and draped in normal sterile fashion. Time-out was performed and a Foley catheter was placed into the bladder.  A vertical paramedian incision was made, removing the prior paramedian scar, and carried down to the underlying fascia. The fascia was incised in the midline, and the peritoneum was then entered. After the incision was extended the operative findings noted above were identified and washings performed. The bowel was freed up and placed up into the upper abdomen, packed away and a Bookwalter abdominal retractor was then placed. The ovarian mass was densely adherent to the rectosigmoid and its mesentery. In fact the mass eroded through the most inferior posterior portion of the mesentery. The mass was also adherent to the superior aspect of  the bladder. These adhesions were lysed with careful dissection and required greater than 45 minutes. The left pelvic peritoneum was dissected with electrocautery and the retroperitoneal space was opened providing adequate exposure. The left ureter were identified and preserved. The infundibulopelvic ligaments were skeletonized, divided, then ligated with the Ligasure. Adhesions involving the mass to the left sidewall, bladder, rectosigmoid, the mesentery and pelvic floor were lysed both with cautery as well as blunt dissection with care to avoid the bladder, bowel, left iliac vessels, and ureter. The mass was removed and submitted for intraoperative evaluation. The findings returned consistent with a high-grade serous ovarian cancer.  After the abdomen was irrigated and all pedicles felt to be hemostatic. Fibrillar was placed on the pelvic floor. The laparotomy packs were removed. Seprafilm was placed along the left pelvic sidewall and below the midline portion of the incision. The fascia was then reapproximated in a running mass abdominal wall closure using 1 loop Maxon. During closure of the fascial wall the patient had a bradycardiac event with pulse to 24 (please see anesthesia note). This was felt to be a vasovagal reaction. She responded to the anesthesia team therapy and her pulse increased appropriately.  The subcutaneous space was then irrigated, and the skin was closed with 4-0 Vicryl. Other than the bradycardic event, the patient tolerated the procedure well. Sponge, lap, needle and instrument counts were correct x2 at the end of the procedure.   Antibiotics: Given 1st or 2nd generation cephalosporin, Antibiotics given within 1 hour of the start of the procedure, Antibiotics ordered to be discontinued within 24 hours post procedure   VTE prophylaxis: was ordered perioperatively.  Wound: Clean   I assisted Dr. Leonides Schanz with this procedure.  Gillis Ends, MD

## 2015-07-20 NOTE — Transfer of Care (Signed)
Immediate Anesthesia Transfer of Care Note  Patient: Kathryn Chandler  Procedure(s) Performed: Procedure(s): LAPAROTOMY (N/A) SALPINGO OOPHORECTOMY (Right) LAPAROTOMY with Left Salpingooophrectomy (Left)  Patient Location: PACU  Anesthesia Type:General  Level of Consciousness: awake, alert  and oriented  Airway & Oxygen Therapy: Patient Spontanous Breathing and Patient connected to face mask oxygen  Post-op Assessment: Report given to RN and Post -op Vital signs reviewed and stable  Post vital signs: Reviewed and stable  Last Vitals:  Filed Vitals:   07/20/15 0627  BP: 142/66  Pulse: 81  Temp: 36.8 C  Resp: 18    Complications: No apparent anesthesia complications

## 2015-07-20 NOTE — H&P (Addendum)
H&P Update  Pt was last seen in Dr. Gershon Crane office, and complete history and physical performed.  The surgical history has been reviewed and remains accurate without interval change. The patient was re-examined and patient's physiologic condition has not changed significantly in the last 30 days.  No new pharmacological allergies or types of therapy has been initiated.  Allergies  Allergen Reactions  . Clindamycin/Lincomycin Diarrhea  . Codeine Anxiety   @CURRENTMEDS @ Past Medical History  Diagnosis Date  . Glaucoma   . GERD (gastroesophageal reflux disease)   . Hypercholesteremia   . PONV (postoperative nausea and vomiting)    Past Surgical History  Procedure Laterality Date  . Abdominal hysterectomy    . Replacement total knee      Right  . Breast biopsy    . Hand surgery      Right    BP 142/66 mmHg  Pulse 81  Temp(Src) 98.2 F (36.8 C) (Oral)  Resp 18  Ht 4\' 11"  (1.499 m)  Wt 58.514 kg (129 lb)  BMI 26.04 kg/m2  SpO2 100%  NAD RRR no murmurs CTAB, no wheezing, resps unlabored +BS, soft, NTTP No c/c/e Pelvic exam deferred  The above history was confirmed with the patient. The condition still exists that makes this procedure necessary. Surgical plan includes laparoscopic oophorectomy, with possible lymph node dissection, ovarian cancer staging, as  confirmed on the consent. The treatment plan remains the same, without new options for care.  The patient understands the potential benefits and risks and the consents have been signed and placed on the chart.     Larey Days, MD Attending Obstetrician Gynecologist [facility]

## 2015-07-21 LAB — HUMAN EPIDIDYMIS PROT 4,SERIAL: HE4: 118 pmol/L (ref 0–150)

## 2015-07-21 LAB — SURGICAL PATHOLOGY

## 2015-07-22 LAB — CBC
HCT: 31.4 % — ABNORMAL LOW (ref 35.0–47.0)
Hemoglobin: 10.3 g/dL — ABNORMAL LOW (ref 12.0–16.0)
MCH: 28.8 pg (ref 26.0–34.0)
MCHC: 32.6 g/dL (ref 32.0–36.0)
MCV: 88.2 fL (ref 80.0–100.0)
PLATELETS: 215 10*3/uL (ref 150–440)
RBC: 3.56 MIL/uL — ABNORMAL LOW (ref 3.80–5.20)
RDW: 14.5 % (ref 11.5–14.5)
WBC: 12.9 10*3/uL — ABNORMAL HIGH (ref 3.6–11.0)

## 2015-07-22 LAB — BASIC METABOLIC PANEL
Anion gap: 7 (ref 5–15)
BUN: 18 mg/dL (ref 6–20)
CALCIUM: 8.5 mg/dL — AB (ref 8.9–10.3)
CO2: 28 mmol/L (ref 22–32)
CREATININE: 0.94 mg/dL (ref 0.44–1.00)
Chloride: 102 mmol/L (ref 101–111)
GFR calc Af Amer: >60 mL/min (ref >60)
GFR, EST NON AFRICAN AMERICAN: 54 mL/min — AB (ref >60)
GLUCOSE: 127 mg/dL — AB (ref 65–99)
Potassium: 3.8 mmol/L (ref 3.5–5.1)
SODIUM: 137 mmol/L (ref 135–145)

## 2015-07-22 LAB — MAGNESIUM: Magnesium: 1.8 mg/dL (ref 1.7–2.4)

## 2015-07-22 NOTE — Care Management Important Message (Signed)
Important Message  Patient Details  Name: Kathryn Chandler MRN: [id number] Date of Birth: 01-03-1929   Medicare Important Message Given:  Yes-second notification given    [name] 07/22/2015, 9:51 AM

## 2015-07-22 NOTE — Progress Notes (Signed)
Pt got up to the bathroom with assist..after sitting and voiding she got up too fast and had a period of syncope. got her into a w/c and git her back to bed.Marland Kitchen

## 2015-07-22 NOTE — Progress Notes (Signed)
Obstetric and Gynecology  POD 2 HD 3  Subjective  Patient doing well, no complaints, voiding, tolerating PO intake, tolerating pain with PO meds, ambulating minimally with assistance.  Had an episode overnight of trying to make it to the bathrrom with assistance but great difficulty/pain with movement.  Had to be seated in wheelchair to be returned to her bed. Denies CP, SOB, F/C, N/V/D, or leg pain.   Objective  Objective:  BP 124/66 mmHg  Pulse 88  Temp(Src) 98.4 F (36.9 C) (Oral)  Resp 18  SpO2 98%   General: NAD Cardiovascular: RRR, no murmurs Pulmonary: CTAB, normal respiratory effort Abdomen: Benign. Non-tender, +BS, no guarding. Incision: covered by honeycomb bandage with heavy soak of dark blood.  Removed and cleaned.  Midline incision c/d/i and not bleeding, no areas of induration or erythema.  Spots of ecchymosis along its entirety. Extremities: No erythema or cords, no calf tenderness, with normal peripheral pulses.  Labs: pending   Assessment   81 y.o. 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   Plan   1. Continue inpatient managment 2. Dressing changed 3. Labs not drawn yesterday f/u today's labs 4. Continue routine post op management/recovery.  Encourage OOB, Incentive spirometry, eating meals sitting up (reg diet)   5. Continue PO pain meds. 6. GI/DVT ppx 7. Discharge planning:SNF recommended, Case Management consulted to help facilitate transfer/placement.     ----- Larey Days, MD Attending Obstetrician and Gynecologist [facility]

## 2015-07-22 NOTE — Progress Notes (Signed)
Called to see patient due to leakage of blood from lower portion of incision bandage. Removed tegaderm/telfa.  Skin covered with bright red blood.  Soaked steri-strips removed from lower portion of incision (lower 6").  Area cleaned and only mild oozing noted at area about 2.5" from lowest edge.  Gauze placed over entire lower area and foam tape applied for a pressure dressing over lower 6" of incision.  Did not replace the steri-strips at this time.  Upper area of incision re-covered with telfa and tegaderm. Patient tolerated the procedure well with minimal pain. Will make Dr.Ward aware and she will be rounding on the patient again in the AM.

## 2015-07-22 NOTE — Evaluation (Signed)
Physical Therapy Evaluation Patient Details Name: Kathryn Chandler MRN: [id number] DOB: 1929-09-11 Today's Date: 07/22/2015   History of Present Illness  Pt here with ovarian cancer, POD2 lap sx  Clinical Impression  Pt with considerable pain from surgery 2 days ago, though she shows good effort and determination.  She is able to do mobility and ambulation acts w/o heavy assist but is not at all close to her baseline regarding strength and mobility.  She is very guarded/hesitant with all acts and though she has no loses of balance or major safety issues going home at this time would not likely be in her best interest.    Follow Up Recommendations SNF    Equipment Recommendations       Recommendations for Other Services       Precautions / Restrictions Precautions Precautions:  (mod fall) Restrictions Weight Bearing Restrictions: No      Mobility  Bed Mobility Overal bed mobility: Needs Assistance Bed Mobility: Supine to Sit;Sit to Supine     Supine to sit: Min guard;Min assist Sit to supine: Min guard;Min assist   General bed mobility comments: Pt is slow and hesitant with bed mobility secondary to abdominal soreness/pain, but does show good effort and needs little assist  Transfers Overall transfer level: Modified independent Equipment used: None             General transfer comment: Pt needing some minimal HHA on intially rising up, but after getting herself sorted out she is able to stand w/o assist and with good confidence  Ambulation/Gait Ambulation/Gait assistance: Min assist;Min guard Ambulation Distance (Feet): 60 Feet Assistive device: None       General Gait Details: Pt with very slow, guarded ambulation.  She at times reaches for PT's hand for min assist, but ultimately has no major losses of balance, but is not near her baseline and feels off.   Stairs            Wheelchair Mobility    Modified Rankin (Stroke Patients Only)        Balance                                             Pertinent Vitals/Pain Pain Assessment:  (pt with signficant abdominal pain, not rated)    Home Living Family/patient expects to be discharged to:: Skilled nursing facility Living Arrangements: Alone               Additional Comments: Pt could possibly go to son's home, does not yet feel ready, safe    Prior Function Level of Independence: Independent         Comments: Pt very active and able to do all she needs, driving, running errands, etc     Hand Dominance        Extremity/Trunk Assessment   Upper Extremity Assessment: Overall WFL for tasks assessed (some abdominal pain with resisting exercises)           Lower Extremity Assessment: Overall WFL for tasks assessed (again some minimal pain with resisted acts)         Communication   Communication: No difficulties  Cognition Arousal/Alertness: Awake/alert Behavior During Therapy: WFL for tasks assessed/performed Overall Cognitive Status: Within Functional Limits for tasks assessed  General Comments      Exercises        Assessment/Plan    PT Assessment    PT Diagnosis Generalized weakness;Difficulty walking   PT Problem List    PT Treatment Interventions     PT Goals (Current goals can be found in the Care Plan section) Acute Rehab PT Goals Patient Stated Goal: "I want to get back to my normal  PT Goal Formulation: With patient Time For Goal Achievement: 08/05/15 Potential to Achieve Goals: Good    Frequency     Barriers to discharge        Co-evaluation               End of Session Equipment Utilized During Treatment: Gait belt Activity Tolerance: Patient limited by pain Patient left: with bed alarm set           Time: 6803-2122 PT Time Calculation (min) (ACUTE ONLY): 18 min   Charges:   PT Evaluation $Initial PT Evaluation Tier I: 1 Procedure     PT G Codes:        Wayne Both, PT, DPT [phone]  Kreg Shropshire 07/22/2015, 3:32 PM

## 2015-07-22 NOTE — Discharge Summary (Signed)
Gynecology Physician Postoperative Discharge Summary  Patient ID: Kathryn Chandler MRN: [id number] DOB/AGE: 03-29-1929 81 y.o.  Admit Date: 07/20/2015 Discharge Date: 07/24/2015  Preoperative Diagnoses: Ovarian mass with septations and nodularity  Procedures: Procedure(s) (LRB): LAPAROTOMY (N/A) SALPINGO OOPHORECTOMY (Right) LAPAROTOMY with Left Salpingooophrectomy (Left)  CBC Latest Ref Rng 07/22/2015 07/20/2015 07/13/2015  WBC 3.6 - 11.0 K/uL 12.9(H) 13.9(H) 10.1  Hemoglobin 12.0 - 16.0 g/dL 10.3(L) 11.3(L) 12.5  Hematocrit 35.0 - 47.0 % 31.4(L) 35.7 39.2  Platelets 150 - 440 K/uL 215 214 256    Hospital Course:  Kathryn Chandler is a 81 y.o. G3P0020  admitted for scheduled surgery.  She underwent the procedures as mentioned above, her operation was uncomplicated. For further details about surgery, please refer to the operative report. Patient had a postoperative course complicated by oozing of her incision. By time of discharge on POD#4, her pain was controlled on oral pain medications; she was ambulating with assistance, voiding without difficulty, tolerating regular diet and passing flatus. She was deemed stable for discharge to home.   Discharge Exam: Blood pressure 150/62, pulse 75, temperature 97.6 F (36.4 C), temperature source Oral, resp. rate 18, SpO2 99 %. General appearance: alert and no distress  Resp: clear to auscultation bilaterally, normal respiratory effort Cardio: regular rate and rhythm  GI: soft, non-tender; bowel sounds normal; no masses, no organomegaly.  Incision: C/D/I, no erythema, no drainage noted.  Pressure dressing applied and left intact, no bleeding noted on any dressings.    Extremities: extremities normal, atraumatic, no cyanosis or edema and Homans sign is negative, no sign of DVT  Discharged Condition: Stable  Disposition: To SNF for rehab.      Discharge Instructions    Diet - low sodium heart healthy    Complete by:  As directed      Discharge  patient    Complete by:  As directed   To [facility]     Driving Restrictions    Complete by:  As directed   Do not drive for 2 weeks, or until you are confident you will be able to slam on the breaks Do not drive on narcotics     Increase activity slowly    Complete by:  As directed      Lifting restrictions    Complete by:  As directed   Do not lift anything over 10 pounds     Remove dressing in 24 hours    Complete by:  As directed   Keep steris in place until they start to peel off themselves            Medication List    TAKE these medications        acetaminophen 500 MG tablet  Commonly known as:  TYLENOL  Take 500 mg by mouth every 6 (six) hours as needed.     bismuth subsalicylate 798 MG chewable tablet  Commonly known as:  PEPTO BISMOL  Chew by mouth as needed.     enoxaparin 40 MG/0.4ML injection  Commonly known as:  LOVENOX  Inject 0.4 mLs (40 mg total) into the skin daily.     fluticasone 50 MCG/ACT nasal spray  Commonly known as:  FLONASE  Place 2 sprays into both nostrils daily as needed.     ibuprofen 600 MG tablet  Commonly known as:  ADVIL,MOTRIN  Take 1 tablet (600 mg total) by mouth every 6 (six) hours.     LORazepam 0.5 MG tablet  Commonly known  as:  ATIVAN  Take by mouth 2 (two) times daily.     LUMIGAN 0.01 % Soln  Generic drug:  bimatoprost  Place 1 drop into both eyes at bedtime.     meloxicam 15 MG tablet  Commonly known as:  MOBIC  Take 15 mg by mouth daily.     menthol-cetylpyridinium 3 MG lozenge  Commonly known as:  CEPACOL  Take 1 lozenge (3 mg total) by mouth every 2 (two) hours as needed for sore throat.     omeprazole 20 MG capsule  Commonly known as:  PRILOSEC  Take 20 mg by mouth daily.     ondansetron 4 MG tablet  Commonly known as:  ZOFRAN  Take 1 tablet (4 mg total) by mouth every 6 (six) hours as needed for nausea.     oxyCODONE 5 MG immediate release tablet  Commonly known as:  Oxy IR/ROXICODONE   Take 1 tablet (5 mg total) by mouth every 4 (four) hours as needed for moderate pain.     simvastatin 20 MG tablet  Commonly known as:  ZOCOR  Take 20 mg by mouth at bedtime.     traMADol 50 MG tablet  Commonly known as:  ULTRAM  Take 1 tablet (50 mg total) by mouth every 6 (six) hours.     Vitamin C CR 500 MG Cpcr  Take 1 capsule by mouth daily.       Follow-up Information    Follow up with Gillis Ends, MD In 2 weeks.   Specialty:  Obstetrics and Gynecology   Why:  for post op check   Contact information:   [facility] [place] [address] [phone]       Follow up with Forest Gleason, MD In 2 weeks.   Specialty:  Oncology   Why:  for initial discussion of treatment options   Contact information:   [place] [address] [phone]       Follow up with Zarif Rathje, Honor Loh, MD In 2 weeks.   Specialty:  Obstetrics and Gynecology   Why:  As needed, If symptoms worsen, For wound re-check   Contact information:   [place] [address] [phone]       Signed:  Lilyann Gravelle, Honor Loh Attending [facility]

## 2015-07-22 NOTE — Progress Notes (Signed)
Obstetric and Gynecology  POD 1 HD 2  Subjective  Patient doing well, no complaints, tolerating PO intake, tolerating pain with PO meds, ambulating minimally with assistance.  Foley has been removed but patient has not yet voided.   Denies CP, SOB, F/C, N/V/D, or leg pain.   Objective  Objective:  VS: 98.2 (Tm 98.5) 78  16  127/58  97% RA  General: NAD Cardiovascular: RRR, no murmurs Pulmonary: CTAB, normal respiratory effort Abdomen: Benign. Non-tender, +BS, no guarding. Incision: covered by honeycomb bandage with spots of dark coagulated blood Extremities: No erythema or cords, no calf tenderness, with normal peripheral pulses.  Labs: pending   Assessment   81 y.o. 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   Plan   1. Continue inpatient managment 2.  F/u void 3. F/U labs 4. Continue routine post op management/recovery.  Encourage OOB, Incentive spirometry, eating meals sitting up (reg diet)   5. Continue PO pain meds. 6. D/C IVF 7. Discharge planning: patient is wondering if she needs to go to a SNF for a short time after hospitalization.  Will have PT evaluate and give recs for placement if needed.  Patient prefers [facility] if so.   ----- Larey Days, MD Attending Obstetrician and Gynecologist [facility]

## 2015-07-23 MED ORDER — MAGNESIUM OXIDE 400 (241.3 MG) MG PO TABS
400.0000 mg | ORAL_TABLET | Freq: Every day | ORAL | Status: DC
  Administered 2015-07-23: 400 mg via ORAL
  Filled 2015-07-23 (×2): qty 1

## 2015-07-23 MED ORDER — SILVER NITRATE-POT NITRATE 75-25 % EX MISC
1.0000 "application " | Freq: Once | CUTANEOUS | Status: AC
  Administered 2015-07-23: 1 via TOPICAL
  Filled 2015-07-23: qty 1

## 2015-07-23 NOTE — Care Management Note (Signed)
Case Management Note  Patient Details  Name: Kathryn Chandler MRN: [id number] Date of Birth: February 04, 1929  Subjective/Objective:   Case management consult was put in in error per MD is requesting SNF placement. CSW Blima Rich is aware and is following [name] for SNF placement.                  Action/Plan:   Expected Discharge Date:                  Expected Discharge Plan:     In-House Referral:     Discharge planning Services     Post Acute Care Choice:    Choice offered to:     DME Arranged:    DME Agency:     HH Arranged:    [place] Agency:     Status of Service:     Medicare Important Message Given:  Yes-second notification given Date Medicare IM Given:    Medicare IM give by:    Date Additional Medicare IM Given:    Additional Medicare Important Message give by:     If discussed at [place] of Stay Meetings, dates discussed:    Additional Comments:  Enzo Treu A, RN 07/23/2015, 3:52 PM

## 2015-07-23 NOTE — Plan of Care (Signed)
Problem: Discharge Progression Outcomes Goal: Other Discharge Outcomes/Goals Outcome: Progressing VSS. No bleeding noted from abd incision during the shift. Silver nitrate used and dressing changed by Dr Leonides Schanz. Dressing dry and intact. Mild pain post dressing changed. Pain managed with scheduled pain meds. Pt declined further action. No c/o n/v. Passing flatus. No BM yet.

## 2015-07-23 NOTE — Clinical Social Work Note (Signed)
Clinical Social Work Assessment  Patient Details  Name: Kathryn Chandler MRN: [id number] Date of Birth: 1929-06-10  Date of referral:  07/23/15               Reason for consult:  Facility Placement                Permission sought to share information with:  Chartered certified accountant granted to share information::  Yes, Verbal Permission Granted  Name::      [facility]  Agency::   [facility]   Relationship::     Contact Information:     Housing/Transportation Living arrangements for the past 2 months:  [place] of Information:  Patient Patient Interpreter Needed:  None Criminal Activity/Legal Involvement Pertinent to Current Situation/Hospitalization:  No - Comment as needed Significant Relationships:  Adult Children Lives with:  Self Do you feel safe going back to the place where you live?  Yes Need for family participation in patient care:  Yes (Comment)  Care giving concerns: Patient lives alone in [place].    Social Worker assessment / plan: [place] representative (CSW) received verbal SNF consult from Chief Strategy Officer. MD put in SNF consult under case management. PT has evaluated patient and is recommending SNF. CSW met with patient to discuss D/C plan. Patient was laying in the bed and reported that she was not feeling well. Patient was alert and oriented. Patient reported that she lives alone in [place] and her son Kathryn Chandler [phone] is her primary support. Kathryn Chandler lives in [place]. Per patient after she goes to rehab she is going to stay with her son Kathryn Chandler in [place]. CSW explained rehab process. Patient reported that she has been to rehab before at [facility] after a knee surgery. Patient is agreeable to SNF search in [facility].   CSW presented bed offers. Patient chose [facility]. CSW contacted [facility] and made him aware of above. Per Broadus John they can accept patient on Sunday. CSW contacted patient's son Kathryn Chandler and made  him aware of above. Son is in agreement with plan. Patient will D/C to [facility] tomorrow 07/24/15. MD and RN are aware of above.    Employment status:  Retired Nurse, adult PT Recommendations:  [place] / Referral to community resources:  [place]  Patient/Family's Response to care: Patient and son are agreeable to going to [facility].   Patient/Family's Understanding of and Emotional Response to Diagnosis, Current Treatment, and Prognosis: Patient thanked CSW for visit and assisting with placement.   Emotional Assessment Appearance:  Appears stated age Attitude/Demeanor/Rapport:    Affect (typically observed):  Accepting, Adaptable, Pleasant Orientation:  Oriented to Self, Oriented to Place, Oriented to  Time, Oriented to Situation Alcohol / Substance use:  Not Applicable Psych involvement (Current and /or in the community):  No (Comment)  Discharge Needs  Concerns to be addressed:  Discharge Planning Concerns Readmission within the last 30 days:  No Current discharge risk:  Dependent with Mobility Barriers to Discharge:  Continued Medical Work up   Loralyn Freshwater, LCSW 07/23/2015, 5:07 PM

## 2015-07-23 NOTE — Clinical Social Work Placement (Signed)
   CLINICAL SOCIAL WORK PLACEMENT  NOTE  Date:  07/23/2015  Patient Details  Name: TYSHA GRISMORE MRN: [id number] Date of Birth: 04/01/1929  Clinical Social Work is seeking post-discharge placement for this patient at the [place] level of care (*CSW will initial, date and re-position this form in  chart as items are completed):  Yes   Patient/family provided with [place] Work Department's list of facilities offering this level of care within the geographic area requested by the patient (or if unable, by the patient's family).  Yes   Patient/family informed of their freedom to choose among providers that offer the needed level of care, that participate in Medicare, Medicaid or managed care program needed by the patient, have an available bed and are willing to accept the patient.  Yes   Patient/family informed of [facility]'s ownership interest in [facility] and [facility], as well as of the fact that they are under no obligation to receive care at these facilities.  PASRR submitted to EDS on       PASRR number received on       Existing PASRR number confirmed on 07/23/15     FL2 transmitted to all facilities in geographic area requested by pt/family on 07/23/15     FL2 transmitted to all facilities within larger geographic area on       Patient informed that his/her managed care company has contracts with or will negotiate with certain facilities, including the following:        Yes   Patient/family informed of bed offers received.  Patient chooses bed at  ([facility] )     Physician recommends and patient chooses bed at      Patient to be transferred to   on  .  Patient to be transferred to facility by       Patient family notified on   of transfer.  Name of family member notified:        PHYSICIAN Please sign FL2     Additional Comment:    _______________________________________________ Loralyn Freshwater, LCSW 07/23/2015,  5:07 PM

## 2015-07-23 NOTE — Progress Notes (Signed)
Obstetric and Gynecology  POD 3  Subjective  Patient doing well, no complaints, tolerating PO intake, tolerating pain with PO meds, ambulating with assistance but without difficulty, voiding spontaneously.   Had more bleeding through dressing overnight and s/p pressure dressing application  Denies CP, SOB, F/C, N/V/D, or leg pain.   Objective  Objective:   Filed Vitals:   07/22/15 1954 07/22/15 2333 07/23/15 0343 07/23/15 0726  BP: 134/63 121/63 128/69 131/71  Pulse: 78 92 92 90  Temp: 98.2 F (36.8 C) 98.2 F (36.8 C) 97.3 F (36.3 C) 98.3 F (36.8 C)  TempSrc: Oral Oral Oral Oral  Resp: 18 18 18 18   Height:      Weight:      SpO2: 98% 93% 96% 96%      General: NAD Cardiovascular: RRR, no murmurs Pulmonary: CTAB, normal respiratory effort Abdomen: Benign. Non-tender, +BS, no guarding.  Incision: superior aspect of dressing c/d/i, inferior aspect had been removed and replaced with moderate pressure dressing.  Soaked with blood.  Removed, incision area cleaned.  No evidence of specific site of bleed.  Sterile instruments gathered, incision opened with scalpel and traction, irrigated with sterile saline, small oozy area identified and treated with silver nitrate.  No evidence of further bleeding.  Absorbable suture stapled in areas to reapproximate the skin, steris places for non-sutured areas.  Sterile dressing placed on top and covered in tegaderm; pressure dressing applie Extremities: No erythema or cords, no calf tenderness, with normal peripheral pulses.  Labs: Results for orders placed or performed during the hospital encounter of 07/20/15 (from the past 24 hour(s))  CBC     Status: Abnormal   Collection Time: 07/22/15  8:06 PM  Result Value Ref Range   WBC 12.9 (H) 3.6 - 11.0 K/uL   RBC 3.56 (L) 3.80 - 5.20 MIL/uL   Hemoglobin 10.3 (L) 12.0 - 16.0 g/dL   HCT 31.4 (L) 35.0 - 47.0 %   MCV 88.2 80.0 - 100.0 fL   MCH 28.8 26.0 - 34.0 pg   MCHC 32.6 32.0 - 36.0 g/dL    RDW 14.5 11.5 - 14.5 %   Platelets 215 150 - 440 K/uL  Basic metabolic panel     Status: Abnormal   Collection Time: 07/22/15  8:06 PM  Result Value Ref Range   Sodium 137 135 - 145 mmol/L   Potassium 3.8 3.5 - 5.1 mmol/L   Chloride 102 101 - 111 mmol/L   CO2 28 22 - 32 mmol/L   Glucose, Bld 127 (H) 65 - 99 mg/dL   BUN 18 6 - 20 mg/dL   Creatinine, Ser 0.94 0.44 - 1.00 mg/dL   Calcium 8.5 (L) 8.9 - 10.3 mg/dL   GFR calc non Af Amer 54 (L) >60 mL/min   GFR calc Af Amer >60 >60 mL/min   Anion gap 7 5 - 15  Magnesium     Status: None   Collection Time: 07/22/15  8:06 PM  Result Value Ref Range   Magnesium 1.8 1.7 - 2.4 mg/dL       Assessment   81 y.o. 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  Plan   1. Doing well, continue routine post op cares 2. Incision: maintain pressure dressing and please report any further bleeding to me, will start Bactrim x 5 days for coverage since incision opened. 3. Cancer: will await recovery and then offer her chemotherapy  with Dr. Jeb Levering, to be coordinated with Dr. Theora Gianotti.  Possible neoadjuvant with interim debulking; discussed that many options will be offered, with survival benefit reviewed.  Ultimately to treat or not to treat will be patient's decision. 4. Dispo:  PT agrees with SNF. Had consulted case management for placement however they do not do this, SW does.  Spoke to SW and they will see her today for discharge planning.  Continue inpatient until placement secured.  ----- Larey Days, MD Attending Obstetrician and Gynecologist [facility]

## 2015-07-24 MED ORDER — TRAMADOL HCL 50 MG PO TABS: 50.0000 mg | ORAL_TABLET | Freq: Four times a day (QID) | ORAL | Status: DC

## 2015-07-24 MED ORDER — IBUPROFEN 600 MG PO TABS: 600.0000 mg | ORAL_TABLET | Freq: Four times a day (QID) | ORAL | Status: DC

## 2015-07-24 MED ORDER — OXYCODONE HCL 5 MG PO TABS: 5.0000 mg | ORAL_TABLET | ORAL | Status: DC | PRN

## 2015-07-24 MED ORDER — ENOXAPARIN SODIUM 40 MG/0.4ML ~~LOC~~ SOLN: 40.0000 mg | SUBCUTANEOUS | Status: DC

## 2015-07-24 MED ORDER — MENTHOL 3 MG MT LOZG: 1.0000 | LOZENGE | OROMUCOSAL | Status: DC | PRN

## 2015-07-24 MED ORDER — ONDANSETRON HCL 4 MG PO TABS: 4.0000 mg | ORAL_TABLET | Freq: Four times a day (QID) | ORAL | Status: DC | PRN

## 2015-07-24 NOTE — Progress Notes (Signed)
EMS here for non emergent transport to [facility].  Report given to EMS with packet of paperwork for facility.  Rx included in packet information.  Pt discharged to facility via EMS.  Condition stable.  Abd dressing intact without bleeding noted.

## 2015-07-24 NOTE — Progress Notes (Signed)
Called report to nurse at [facility].

## 2015-07-24 NOTE — Clinical Social Work Placement (Signed)
   CLINICAL SOCIAL WORK PLACEMENT  NOTE  Date:  07/24/2015  Patient Details  Name: Kathryn Chandler MRN: [id number] Date of Birth: Jun 26, 1929  Clinical Social Work is seeking post-discharge placement for this patient at the [place] level of care (*CSW will initial, date and re-position this form in  chart as items are completed):  Yes   Patient/family provided with [place] Work Department's list of facilities offering this level of care within the geographic area requested by the patient (or if unable, by the patient's family).  Yes   Patient/family informed of their freedom to choose among providers that offer the needed level of care, that participate in Medicare, Medicaid or managed care program needed by the patient, have an available bed and are willing to accept the patient.  Yes   Patient/family informed of [facility]'s ownership interest in [facility] and [facility], as well as of the fact that they are under no obligation to receive care at these facilities.  PASRR submitted to EDS on       PASRR number received on       Existing PASRR number confirmed on 07/23/15     FL2 transmitted to all facilities in geographic area requested by pt/family on 07/23/15     FL2 transmitted to all facilities within larger geographic area on       Patient informed that his/her managed care company has contracts with or will negotiate with certain facilities, including the following:        Yes   Patient/family informed of bed offers received.  Patient chooses bed at  ([facility] )     Physician recommends and patient chooses bed at      Patient to be transferred to  ([facility] ) on 07/24/15.  Patient to be transferred to facility by  [facility] )     Patient family notified on 07/24/15 of transfer.  Name of family member notified:   ([name] is aware of above. )     PHYSICIAN       Additional Comment:     _______________________________________________ Loralyn Freshwater, LCSW 07/24/2015, 1:00 PM

## 2015-07-24 NOTE — Progress Notes (Signed)
Patient is medically stable for D/C to [facility] today. Per [facility] liaison patient is going to room 141. RN called report and arrange EMS for transport. Clinical Education officer, museum (CSW) prepared D/C packet and faxed D/C Summary to [facility]. Patient is aware of above. CSW contacted patient's [name] and made him aware of above. Please reconsult if future social work needs arise. CSW signing off.   Blima Rich, [place] [phone]

## 2015-07-24 NOTE — Discharge Instructions (Signed)
You are being discharged in a stable condition.  The incision will heal and bandages will need to be removed tomorrow.  The steri strips will remain in place until they are falling off.  You are being discharged to a [facility] for rehabilitation after your surgery, they will help you regain your strength.

## 2015-08-03 ENCOUNTER — Encounter: Payer: Self-pay | Admitting: Oncology

## 2015-08-03 ENCOUNTER — Ambulatory Visit: Payer: Medicare Other

## 2015-08-03 ENCOUNTER — Encounter: Payer: Self-pay | Admitting: Obstetrics and Gynecology

## 2015-08-03 ENCOUNTER — Inpatient Hospital Stay (HOSPITAL_BASED_OUTPATIENT_CLINIC_OR_DEPARTMENT_OTHER): Payer: Medicare Other | Admitting: Oncology

## 2015-08-03 ENCOUNTER — Inpatient Hospital Stay: Payer: Medicare Other | Attending: Obstetrics and Gynecology | Admitting: Obstetrics and Gynecology

## 2015-08-03 VITALS — BP 137/80 | HR 88 | Temp 98.3°F | Wt 124.0 lb

## 2015-08-03 VITALS — BP 137/80 | HR 88 | Temp 98.3°F | Wt 124.1 lb

## 2015-08-03 DIAGNOSIS — R531 Weakness: Secondary | ICD-10-CM | POA: Insufficient documentation

## 2015-08-03 DIAGNOSIS — C786 Secondary malignant neoplasm of retroperitoneum and peritoneum: Secondary | ICD-10-CM | POA: Insufficient documentation

## 2015-08-03 DIAGNOSIS — R21 Rash and other nonspecific skin eruption: Secondary | ICD-10-CM | POA: Insufficient documentation

## 2015-08-03 DIAGNOSIS — Z79899 Other long term (current) drug therapy: Secondary | ICD-10-CM | POA: Insufficient documentation

## 2015-08-03 DIAGNOSIS — F419 Anxiety disorder, unspecified: Secondary | ICD-10-CM | POA: Insufficient documentation

## 2015-08-03 DIAGNOSIS — R5383 Other fatigue: Secondary | ICD-10-CM | POA: Insufficient documentation

## 2015-08-03 DIAGNOSIS — H269 Unspecified cataract: Secondary | ICD-10-CM | POA: Insufficient documentation

## 2015-08-03 DIAGNOSIS — Z780 Asymptomatic menopausal state: Secondary | ICD-10-CM | POA: Diagnosis not present

## 2015-08-03 DIAGNOSIS — Z90722 Acquired absence of ovaries, bilateral: Secondary | ICD-10-CM | POA: Insufficient documentation

## 2015-08-03 DIAGNOSIS — Z5111 Encounter for antineoplastic chemotherapy: Secondary | ICD-10-CM | POA: Insufficient documentation

## 2015-08-03 DIAGNOSIS — Z791 Long term (current) use of non-steroidal anti-inflammatories (NSAID): Secondary | ICD-10-CM | POA: Insufficient documentation

## 2015-08-03 DIAGNOSIS — E7800 Pure hypercholesterolemia, unspecified: Secondary | ICD-10-CM | POA: Insufficient documentation

## 2015-08-03 DIAGNOSIS — C439 Malignant melanoma of skin, unspecified: Secondary | ICD-10-CM | POA: Insufficient documentation

## 2015-08-03 DIAGNOSIS — B019 Varicella without complication: Secondary | ICD-10-CM | POA: Insufficient documentation

## 2015-08-03 DIAGNOSIS — Y92531 Health care provider office as the place of occurrence of the external cause: Secondary | ICD-10-CM | POA: Insufficient documentation

## 2015-08-03 DIAGNOSIS — Z7902 Long term (current) use of antithrombotics/antiplatelets: Secondary | ICD-10-CM | POA: Insufficient documentation

## 2015-08-03 DIAGNOSIS — C569 Malignant neoplasm of unspecified ovary: Secondary | ICD-10-CM | POA: Diagnosis not present

## 2015-08-03 DIAGNOSIS — I959 Hypotension, unspecified: Secondary | ICD-10-CM | POA: Insufficient documentation

## 2015-08-03 DIAGNOSIS — T7840XA Allergy, unspecified, initial encounter: Secondary | ICD-10-CM | POA: Insufficient documentation

## 2015-08-03 DIAGNOSIS — N63 Unspecified lump in unspecified breast: Secondary | ICD-10-CM | POA: Insufficient documentation

## 2015-08-03 DIAGNOSIS — T451X5A Adverse effect of antineoplastic and immunosuppressive drugs, initial encounter: Secondary | ICD-10-CM | POA: Insufficient documentation

## 2015-08-03 DIAGNOSIS — K219 Gastro-esophageal reflux disease without esophagitis: Secondary | ICD-10-CM

## 2015-08-03 DIAGNOSIS — N926 Irregular menstruation, unspecified: Secondary | ICD-10-CM | POA: Insufficient documentation

## 2015-08-03 DIAGNOSIS — R000 Tachycardia, unspecified: Secondary | ICD-10-CM | POA: Insufficient documentation

## 2015-08-03 DIAGNOSIS — I10 Essential (primary) hypertension: Secondary | ICD-10-CM | POA: Insufficient documentation

## 2015-08-03 DIAGNOSIS — R110 Nausea: Secondary | ICD-10-CM | POA: Insufficient documentation

## 2015-08-03 DIAGNOSIS — R0602 Shortness of breath: Secondary | ICD-10-CM | POA: Insufficient documentation

## 2015-08-03 LAB — CBC WITH DIFFERENTIAL/PLATELET
BASOS PCT: 1 %
Basophils Absolute: 0.1 10*3/uL (ref 0–0.1)
EOS PCT: 1 %
Eosinophils Absolute: 0.1 10*3/uL (ref 0–0.7)
HEMATOCRIT: 35 % (ref 35.0–47.0)
Hemoglobin: 11.3 g/dL — ABNORMAL LOW (ref 12.0–16.0)
LYMPHS PCT: 18 %
Lymphs Abs: 2.2 10*3/uL (ref 1.0–3.6)
MCH: 28.2 pg (ref 26.0–34.0)
MCHC: 32.4 g/dL (ref 32.0–36.0)
MCV: 87.2 fL (ref 80.0–100.0)
MONO ABS: 1 10*3/uL — AB (ref 0.2–0.9)
MONOS PCT: 8 %
NEUTROS ABS: 9.2 10*3/uL — AB (ref 1.4–6.5)
Neutrophils Relative %: 72 %
Platelets: 434 10*3/uL (ref 150–440)
RBC: 4.01 MIL/uL (ref 3.80–5.20)
RDW: 14.4 % (ref 11.5–14.5)
WBC: 12.7 10*3/uL — ABNORMAL HIGH (ref 3.6–11.0)

## 2015-08-03 LAB — COMPREHENSIVE METABOLIC PANEL
ALT: 12 U/L — ABNORMAL LOW (ref 14–54)
ANION GAP: 10 (ref 5–15)
AST: 24 U/L (ref 15–41)
Albumin: 3.7 g/dL (ref 3.5–5.0)
Alkaline Phosphatase: 69 U/L (ref 38–126)
BILIRUBIN TOTAL: 0.8 mg/dL (ref 0.3–1.2)
BUN: 8 mg/dL (ref 6–20)
CO2: 28 mmol/L (ref 22–32)
Calcium: 9 mg/dL (ref 8.9–10.3)
Chloride: 99 mmol/L — ABNORMAL LOW (ref 101–111)
Creatinine, Ser: 0.69 mg/dL (ref 0.44–1.00)
GFR calc Af Amer: >60 mL/min (ref >60)
Glucose, Bld: 105 mg/dL — ABNORMAL HIGH (ref 65–99)
POTASSIUM: 4.2 mmol/L (ref 3.5–5.1)
Sodium: 137 mmol/L (ref 135–145)
TOTAL PROTEIN: 7.4 g/dL (ref 6.5–8.1)

## 2015-08-03 LAB — MAGNESIUM: MAGNESIUM: 1.8 mg/dL (ref 1.7–2.4)

## 2015-08-03 MED ORDER — DEXAMETHASONE 4 MG PO TABS: ORAL_TABLET | ORAL | Status: DC

## 2015-08-03 MED ORDER — ONDANSETRON HCL 4 MG PO TABS: 4.0000 mg | ORAL_TABLET | Freq: Four times a day (QID) | ORAL | Status: DC | PRN

## 2015-08-03 NOTE — Progress Notes (Signed)
Dressing change to lower abdomen surgical incision. Wound bright red with, edges not approximate, scant amount of gray drainage on old dressing. Cleansed wound with normal saline repacked with moist gauze covered with absorbant dressing. Instructed son on wound care.

## 2015-08-03 NOTE — Progress Notes (Signed)
Gynecologic Oncology Interval Visit   Referring Provider: Dr. Hall Busing  Chief Concern: postoperative visit for newly diagnosed stage IIIC ovarian cancer.   Subjective:  Kathryn Chandler is a 81 y.o. female who is seen initially in consultation from Dr. Hall Busing for symptomatic ovarian mass. She underwent laparotomy and LSO with resection of pelvic mass on 07/20/2015 and has stage IIIC ovarian cancer. Her disease was not amendable to debulking to no gross residual. She would have required several bowel resections, diagphragm stripping, and liver mobilization and due to peritoneal carcinomatosis would not have been debulked to R0.   FINAL PATHOLOGY DIAGNOSIS:  A. LEFT OVARY; OOPHORECTOMY:  - HIGH-GRADE SEROUS CARCINOMA.  - FALLOPIAN TUBE NOT IDENTIFIED.   She presents today for her postoperative evaluation. She also saw Dr. Oliva Bustard for consideration of chemotherapy. Postoperatively she was noted to have oozing from the wound. Her Lovenox was discontinued and the wound was opened. She's been taking care of the wound with daily dressing changes. She is scheduled to restart Lovenox today.  Lab Results  Component Value Date   CA125 74.2* 07/18/2015    HE4 = 118  Gynecologic Oncology History   Please see prior notes for complete detail. Ultrasound 07/04/2015  FINDINGS: Uterus:Prior hysterectomy. Right ovary: Not visualized. Left ovary: There is a large septated cystic left adnexal mass measuring 12.8 x 9.6 x 13.9 cm. There is a 15 mm mural nodule. There is Doppler flow in 1 of the septations. There is no pelvic free fluid.  Pertinent positive ROS includes change in stool caliber and form since July. She had a colonoscopy years ago which she says was normal. I was not able to locate the report, but it appears that the procedure may have been done 15 years ago.   06/07/2014 Mammogram IMPRESSION: No mammographic evidence of malignancy. A result letter of this screening mammogram will be mailed directly to  the patient.  Genetic testing has already been ordered by Dr. Oliva Bustard.   Problem List: Patient Active Problem List   Diagnosis Date Noted  . Acid reflux 08/03/2015  . Bilateral cataracts 08/03/2015  . Chicken pox 08/03/2015  . Allergic state 08/03/2015  . Breast lump 08/03/2015  . Hypercholesteremia 08/03/2015  . Malignant melanoma 08/03/2015  . Abnormal menses 08/03/2015  . Ovarian cancer 07/20/2015  . Head or neck swelling, mass, or lump 08/17/2014    Past Medical History: Past Medical History  Diagnosis Date  . Glaucoma   . GERD (gastroesophageal reflux disease)   . Hypercholesteremia   . PONV (postoperative nausea and vomiting)     Past Surgical History: Past Surgical History  Procedure Laterality Date  . Abdominal hysterectomy    . Replacement total knee      Right  . Breast biopsy    . Hand surgery      Right  . Laparotomy Left 07/20/2015    Procedure: LAPAROTOMY with Left Salpingooophrectomy;  Surgeon: Honor Loh Ward, MD;  Location: [facility] ORS;  Service: Gynecology;  Laterality: Left;  . Laparotomy N/A 07/20/2015    Procedure: LAPAROTOMY;  Surgeon: Gillis Ends, MD;  Location: [facility] ORS;  Service: Gynecology;  Laterality: N/A;  . Salpingoophorectomy Right 07/20/2015    Procedure: SALPINGO OOPHORECTOMY;  Surgeon: Gillis Ends, MD;  Location: [facility] ORS;  Service: Gynecology;  Laterality: Right;    Past Gynecologic History:  Menopause: 1976    OB History:  OB History  Gravida Para Term Preterm AB SAB TAB Ectopic Multiple Living  3 1  2 2        # Outcome Date GA Lbr Len/2nd Weight Sex Delivery Anes PTL Lv  3 Para           2 SAB           1 SAB               Family History: No family history on file.  Social History: Social History   Social History  . Marital Status: Married    Spouse Name: N/A  . Number of Children: N/A  . Years of Education: N/A   Occupational History  .      Retired   Social History Main Topics  .  Smoking status: Never Smoker   . Smokeless tobacco: Never Used  . Alcohol Use: No  . Drug Use: No  . Sexual Activity: Not on file   Other Topics Concern  . Not on file   Social History Narrative    Allergies: Allergies  Allergen Reactions  . Clindamycin Hcl Diarrhea    antibiotic colitis  . Clindamycin/Lincomycin Diarrhea  . Codeine Anxiety    Current Medications: Current Outpatient Prescriptions  Medication Sig Dispense Refill  . acetaminophen (TYLENOL) 500 MG tablet Take 500 mg by mouth every 6 (six) hours as needed.     . Ascorbic Acid (VITAMIN C CR) 500 MG CPCR Take 1 capsule by mouth daily.     Marland Kitchen bismuth subsalicylate (PEPTO BISMOL) 262 MG chewable tablet Chew by mouth as needed.     Marland Kitchen dexamethasone (DECADRON) 4 MG tablet Take 1 tab by mouth twice a day the day before chemotherapy. 20 tablet 0  . enoxaparin (LOVENOX) 40 MG/0.4ML injection Inject 0.4 mLs (40 mg total) into the skin daily. 30 Syringe 2  . fluticasone (FLONASE) 50 MCG/ACT nasal spray Place 2 sprays into both nostrils daily as needed.     Marland Kitchen ibuprofen (ADVIL,MOTRIN) 600 MG tablet Take 1 tablet (600 mg total) by mouth every 6 (six) hours. 30 tablet 0  . LORazepam (ATIVAN) 0.5 MG tablet Take by mouth 2 (two) times daily.     Marland Kitchen LUMIGAN 0.01 % SOLN Place 1 drop into both eyes at bedtime.     . meloxicam (MOBIC) 15 MG tablet Take 15 mg by mouth daily.     Marland Kitchen menthol-cetylpyridinium (CEPACOL) 3 MG lozenge Take 1 lozenge (3 mg total) by mouth every 2 (two) hours as needed for sore throat. 100 tablet 12  . omeprazole (PRILOSEC) 20 MG capsule Take 20 mg by mouth daily.     . ondansetron (ZOFRAN) 4 MG tablet Take 1 tablet (4 mg total) by mouth every 6 (six) hours as needed for nausea. 30 tablet 3  . oxyCODONE (OXY IR/ROXICODONE) 5 MG immediate release tablet Take 1 tablet (5 mg total) by mouth every 4 (four) hours as needed for moderate pain. 30 tablet 0  . simvastatin (ZOCOR) 20 MG tablet Take 20 mg by mouth at bedtime.      . traMADol (ULTRAM) 50 MG tablet Take 1 tablet (50 mg total) by mouth every 6 (six) hours. 65 tablet 0   No current facility-administered medications for this visit.    Review of Systems General: Continues to recover from surgery  HEENT: no complaints  Lungs: no complaints  Cardiac: no complaints  GI: able to tolerate oral intake  GU: no complaints  Musculoskeletal: no complaints  Extremities: no complaints  Skin: wound is healing. Wound care daily.   Neuro: She is  feeling depressed and anxious about her diagnosis. She is having difficulty with coping.   Endocrine: no complaints  Psych: no complaints      Objective:  Physical Examination:  There were no vitals taken for this visit.   ECOG Performance Status: 0 - Asymptomatic  General appearance: alert, cooperative and appears stated age HEENT:PERRLA, extra ocular movement intact and sclera clear, anicteric  Abdomen: soft, nontender and nondistended. No palpable mass or ascites, no hernias and well healed incision except lower portion. Healthy granulation tissue present. Wound irrigated NS 200 cc and dressed sterilely.  Neurological exam reveals alert, oriented, normal speech, no focal findings or movement disorder noted.  Pelvic: deferred      Assessment:  Kathryn Chandler is a 81 y.o. 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 Fully attempt at [facility] debulking not performed given extent of disease and peritoneal carcinomatosis. Wound separation, healing well.  Medical co-morbidities complicating care: prior abdominal surgery, coping issues.  Plan:   Problem List Items Addressed This Visit      Genitourinary   Ovarian cancer - Primary      We discussed options for management. I recommended neoadjuvant chemotherapy with consideration of interval debulking. I agree with the use of paclitaxel and carboplatin as per standard of care. She may benefit from the addition of bevacizumab in terms of  reducing tumor burden. If she does get bevacizumab this will have to be delivered on cycle 2 and 3. I would withhold bevacizumab until her wound has completely healed. After cycle 3 of recommended I recommend obtaining a CT scan to assess disease burden and response. If she has evidence of response we can consider surgery after cycle 4. If she has progressive disease then I do not think surgery will be beneficial.  The patient seems fairly depressed regarding her diagnosis. I'm concerned about her coping with her diagnosis. I did offer counseling services but she declined at this time.  Genetic testing has already been ordered by Dr. Oliva Bustard.  I also agree with restarting her Lovenox therapy. She is at high risk for VTE.   We will continue to follow closely. I would like to see her back after cycle 3 of chemotherapy and her CT scan has been obtained.   Gillis Ends, MD    CC:  Larey Days, MD  Albina Billet, MD [address]   [place], [place] [address] [phone]

## 2015-08-03 NOTE — Progress Notes (Signed)
[place] @ [facility] [phone]  Fax:([phone]) [place]: 14-Sep-1929  MR#: [id number]  JYN#:[id number]  Patient Care Team: Albina Billet, MD as PCP - General (Internal Medicine) [facility] Gaetana Michaelis, MD as Referring Physician (Obstetrics and Gynecology) Maceo Pro, MD as Referring Physician (Obstetrics and Gynecology)  CHIEF COMPLAINT: No chief complaint on file.  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 VISIT DIAGNOSIS:  Ovarian cancer   ICD-9-CM ICD-10-CM   1. Ovarian cancer, unspecified laterality 183.0 C56.9 CBC with Differential     Comprehensive metabolic panel     Magnesium     CA 125     ondansetron (ZOFRAN) 4 MG tablet     dexamethasone (DECADRON) 4 MG tablet     DISCONTINUED: ondansetron (ZOFRAN) 4 MG tablet     DISCONTINUED: dexamethasone (DECADRON) 4 MG tablet      No history exists.    Oncology Flowsheet 07/21/2015 07/21/2015 07/22/2015 07/22/2015 07/23/2015 07/23/2015 07/24/2015  dexamethasone (DECADRON) IJ - - - - - - -  enoxaparin (LOVENOX) [place] 40 mg   40 mg   40 mg   -  LORazepam (ATIVAN) PO 0.5 mg 0.5 mg 0.5 mg 0.5 mg 0.5 mg 0.5 mg 0.5 mg  ondansetron (ZOFRAN) IV - - - - - - -    INTERVAL HISTORY: 81 year old lady who had developed abdominal distention had poor appetite loss 15 pounds of weight was evaluated by her primary care physician is CT scan was abnormal patient was referred to GYN oncologist patient underwent explorative laparotomy and extensive disease was found.  Suboptimal debulking was done and patient is here for possibility of adjuvant chemotherapy. Abdominal wound is still healing. Patient was in nursing home getting rehabilitation from which gradually improved Here for further follow-up and treatment consideration  REVIEW OF SYSTEMS:   Gen. status: Patient is feeling weak and tired.  Limited  ambulation.  Performance status is 1 HEENT: No headache no dizziness Lungs: No cough or shortness of breath Cardiac: No chest pain Abdomen: Discomfort in lower abdominal area.  Abdominal wound is still healing particularly lower part is still draining. Appetite is still poor.  No nausea.  No vomiting.  No diarrhea.  No rectal bleeding. GU: No dysuria hematuria Skin: No rash Neurological system no headache no dizziness no other neurological symptoms No significant pain  As per HPI. Otherwise, a complete review of systems is negatve.  PAST MEDICAL HISTORY: Past Medical History  Diagnosis Date  . Glaucoma   . GERD (gastroesophageal reflux disease)   . Hypercholesteremia   . PONV (postoperative nausea and vomiting)     PAST SURGICAL HISTORY: Past Surgical History  Procedure Laterality Date  . Abdominal hysterectomy    . Replacement total knee      Right  . Breast biopsy    . Hand surgery      Right  . Laparotomy Left 07/20/2015    Procedure: LAPAROTOMY with Left Salpingooophrectomy;  Surgeon: Honor Loh Ward, MD;  Location: [facility] ORS;  Service: Gynecology;  Laterality: Left;  . Laparotomy N/A 07/20/2015    Procedure: LAPAROTOMY;  Surgeon: Gillis Ends, MD;  Location: [facility] ORS;  Service: Gynecology;  Laterality: N/A;  . Salpingoophorectomy Right 07/20/2015    Procedure: SALPINGO OOPHORECTOMY;  Surgeon: Gillis Ends, MD;  Location: [facility] ORS;  Service: Gynecology;  Laterality: Right;    FAMILY HISTORY History  reviewed. No pertinent family history. There is no significant family history of breast cancer, ovarian cancer, colon cancer  GYNECOLOGIC HISTORY:  No LMP recorded. Patient has had a hysterectomy.     ADVANCED DIRECTIVES:  Patient does have advance healthcare directive, Patient   does not desire to make any changes  HEALTH MAINTENANCE: Social History  Substance Use Topics  . Smoking status: Never Smoker   . Smokeless tobacco: Never Used  . Alcohol  Use: No      Allergies  Allergen Reactions  . Clindamycin Hcl Diarrhea    antibiotic colitis  . Clindamycin/Lincomycin Diarrhea  . Codeine Anxiety    Current Outpatient Prescriptions  Medication Sig Dispense Refill  . acetaminophen (TYLENOL) 500 MG tablet Take 500 mg by mouth every 6 (six) hours as needed.     . Ascorbic Acid (VITAMIN C CR) 500 MG CPCR Take 1 capsule by mouth daily.     Marland Kitchen bismuth subsalicylate (PEPTO BISMOL) 262 MG chewable tablet Chew by mouth as needed.     . fluticasone (FLONASE) 50 MCG/ACT nasal spray Place 2 sprays into both nostrils daily as needed.     Marland Kitchen ibuprofen (ADVIL,MOTRIN) 600 MG tablet Take 1 tablet (600 mg total) by mouth every 6 (six) hours. 30 tablet 0  . LORazepam (ATIVAN) 0.5 MG tablet Take by mouth 2 (two) times daily.     Marland Kitchen LUMIGAN 0.01 % SOLN Place 1 drop into both eyes at bedtime.     . meloxicam (MOBIC) 15 MG tablet Take 15 mg by mouth daily.     Marland Kitchen menthol-cetylpyridinium (CEPACOL) 3 MG lozenge Take 1 lozenge (3 mg total) by mouth every 2 (two) hours as needed for sore throat. 100 tablet 12  . omeprazole (PRILOSEC) 20 MG capsule Take 20 mg by mouth daily.     . ondansetron (ZOFRAN) 4 MG tablet Take 1 tablet (4 mg total) by mouth every 6 (six) hours as needed for nausea. 30 tablet 3  . oxyCODONE (OXY IR/ROXICODONE) 5 MG immediate release tablet Take 1 tablet (5 mg total) by mouth every 4 (four) hours as needed for moderate pain. 30 tablet 0  . simvastatin (ZOCOR) 20 MG tablet Take 20 mg by mouth at bedtime.     . traMADol (ULTRAM) 50 MG tablet Take 1 tablet (50 mg total) by mouth every 6 (six) hours. 65 tablet 0  . dexamethasone (DECADRON) 4 MG tablet Take 1 tab by mouth twice a day the day before chemotherapy. 20 tablet 0  . enoxaparin (LOVENOX) 40 MG/0.4ML injection Inject 0.4 mLs (40 mg total) into the skin daily. 30 Syringe 2   No current facility-administered medications for this visit.    OBJECTIVE: PHYSICAL EXAM: Gen. status: Patient  is alert oriented not any acute distress.. Lymphatic system: Supraclavicular, cervical, axillary, inguinal lymph nodes are not palpable Cardiac exam revealed the PMI to be normally situated and sized. The rhythm was regular and no extrasystoles were noted during several minutes of auscultation. The first and second heart sounds were normal and physiologic splitting of the second heart sound was noted. There were no murmurs, rubs, clicks, or gallops. Examination of the chest was unremarkable. There were no bony deformities, no asymmetry, and no other abnormalities. Abdomen: Soft no ascites no palpable masses.  Abdominal wound: Lower abdominal wound is still healing. Examination of the skin revealed no evidence of significant rashes, suspicious appearing nevi or other concerning lesions. Neurologically, the patient was awake, alert, and oriented to person, place and time.  There were no obvious focal neurologic abnormalities.  Lower extremity no swelling Musculoskeletal system within normal limit   Filed Vitals:   08/03/15 0949  BP: 137/80  Pulse: 88  Temp: 98.3 F (36.8 C)     Body mass index is 25.06 kg/(m^2).    ECOG FS:1 - Symptomatic but completely ambulatory  LAB RESULTS:  Appointment on 08/03/2015  Component Date Value Ref Range Status  . WBC 08/03/2015 12.7* 3.6 - 11.0 K/uL Final  . RBC 08/03/2015 4.01  3.80 - 5.20 MIL/uL Final  . Hemoglobin 08/03/2015 11.3* 12.0 - 16.0 g/dL Final  . HCT 08/03/2015 35.0  35.0 - 47.0 % Final  . MCV 08/03/2015 87.2  80.0 - 100.0 fL Final  . MCH 08/03/2015 28.2  26.0 - 34.0 pg Final  . MCHC 08/03/2015 32.4  32.0 - 36.0 g/dL Final  . RDW 08/03/2015 14.4  11.5 - 14.5 % Final  . Platelets 08/03/2015 434  150 - 440 K/uL Final  . Neutrophils Relative % 08/03/2015 72   Final  . Neutro Abs 08/03/2015 9.2* 1.4 - 6.5 K/uL Final  . Lymphocytes Relative 08/03/2015 18   Final  . Lymphs Abs 08/03/2015 2.2  1.0 - 3.6 K/uL Final  . Monocytes Relative 08/03/2015  8   Final  . Monocytes Absolute 08/03/2015 1.0* 0.2 - 0.9 K/uL Final  . Eosinophils Relative 08/03/2015 1   Final  . Eosinophils Absolute 08/03/2015 0.1  0 - 0.7 K/uL Final  . Basophils Relative 08/03/2015 1   Final  . Basophils Absolute 08/03/2015 0.1  0 - 0.1 K/uL Final  . Sodium 08/03/2015 137  135 - 145 mmol/L Final  . Potassium 08/03/2015 4.2  3.5 - 5.1 mmol/L Final  . Chloride 08/03/2015 99* 101 - 111 mmol/L Final  . CO2 08/03/2015 28  22 - 32 mmol/L Final  . Glucose, Bld 08/03/2015 105* 65 - 99 mg/dL Final  . BUN 08/03/2015 8  6 - 20 mg/dL Final  . Creatinine, Ser 08/03/2015 0.69  0.44 - 1.00 mg/dL Final  . Calcium 08/03/2015 9.0  8.9 - 10.3 mg/dL Final  . Total Protein 08/03/2015 7.4  6.5 - 8.1 g/dL Final  . Albumin 08/03/2015 3.7  3.5 - 5.0 g/dL Final  . AST 08/03/2015 24  15 - 41 U/L Final  . ALT 08/03/2015 12* 14 - 54 U/L Final  . Alkaline Phosphatase 08/03/2015 69  38 - 126 U/L Final  . Total Bilirubin 08/03/2015 0.8  0.3 - 1.2 mg/dL Final  . GFR calc non Af Amer 08/03/2015 >60  >60 mL/min Final  . GFR calc Af Amer 08/03/2015 >60  >60 mL/min Final   Comment: (NOTE) The eGFR has been calculated using the CKD EPI equation. This calculation has not been validated in all clinical situations. eGFR's persistently <60 mL/min signify possible Chronic Kidney Disease.   . Anion gap 08/03/2015 10  5 - 15 Final  . Magnesium 08/03/2015 1.8  1.7 - 2.4 mg/dL Final     STUDIES: Dg Chest 2 View  07/13/2015   CLINICAL DATA:  Preoperative exam prior to pelvic mass removal, history of hypercholesterolemia.  EXAM: CHEST  2 VIEW  COMPARISON:  PA and lateral chest x-ray of September 26, 2012  FINDINGS: The lungs are well-expanded. There is no focal infiltrate. The interstitial markings are coarse and slightly more conspicuous than on the previous study. There is stable apical pleural thickening bilaterally. The heart and pulmonary vascularity are normal. There is calcification in the wall of  the aortic arch. There is mild tortuosity of the descending thoracic aorta. The bony thorax exhibits no acute abnormality.  IMPRESSION: There is no active cardiopulmonary disease.   Electronically Signed   By: David  Martinique M.D.   On: 07/13/2015 13:44    ASSESSMENT:  Cancer of ovary status post suboptimal debulking surgery because of extensive disease stage III Neoadjuvant chemotherapy with carboplatinum Taxol Patient will receive second cycle second and cycle third a Avastin therapy.  After cycle 4 Patient will get a CT scan and consider exploratory laparotomy for interval  debulking surgery. 2.  Will  have to await for the wound healing 3.  Patient was given Lovenox DVT prophylaxis by Dr. Theora Gianotti for next few days 4.  Proceed with port placement Possible chemotherapy in 10 days if her wound has healed , Patient will attend chemotherapy class Intent of chemotherapy is palliation All the side effects of chemotherapy including myelosuppression, alopecia, nausea vomiting fatigue weakness.  Secondary infection, and   peripheral neuropathy .  Has been discussed in details. Informal consent has been obtained and will be documented by nurses in the chart All lab data has been reviewed Preop CA 125,  Ref Range 2wk ago    CA 125 0.0 - 38.1 U/mL 74.2 (H)          Patient expressed understanding and was in agreement with this plan. She also understands that She can call clinic at any time with any questions, concerns, or complaints.    No matching staging information was found for the patient.  Forest Gleason, MD   08/03/2015 4:57 PM

## 2015-08-03 NOTE — Progress Notes (Signed)
Cecille Rubin, RN assisted provider with wound care.

## 2015-08-04 LAB — CA 125: CA 125: 84.1 U/mL — AB (ref 0.0–38.1)

## 2015-08-08 ENCOUNTER — Other Ambulatory Visit: Payer: Self-pay | Admitting: Vascular Surgery

## 2015-08-09 ENCOUNTER — Other Ambulatory Visit: Payer: Medicare Other

## 2015-08-09 NOTE — Patient Instructions (Signed)
Paclitaxel injection What is this medicine? PACLITAXEL (PAK li TAX el) is a chemotherapy drug. It targets fast dividing cells, like cancer cells, and causes these cells to die. This medicine is used to treat ovarian cancer, breast cancer, and other cancers. This medicine may be used for other purposes; ask your health care provider or pharmacist if you have questions. COMMON BRAND NAME(S): Onxol, Taxol What should I tell my health care provider before I take this medicine? They need to know if you have any of these conditions: -blood disorders -irregular heartbeat -infection (especially a virus infection such as chickenpox, cold sores, or herpes) -liver disease -previous or ongoing radiation therapy -an unusual or allergic reaction to paclitaxel, alcohol, polyoxyethylated castor oil, other chemotherapy agents, other medicines, foods, dyes, or preservatives -pregnant or trying to get pregnant -breast-feeding How should I use this medicine? This drug is given as an infusion into a vein. It is administered in a hospital or clinic by a specially trained health care professional. Talk to your pediatrician regarding the use of this medicine in children. Special care may be needed. Overdosage: If you think you have taken too much of this medicine contact a poison control center or emergency room at once. NOTE: This medicine is only for you. Do not share this medicine with others. What if I miss a dose? It is important not to miss your dose. Call your doctor or health care professional if you are unable to keep an appointment. What may interact with this medicine? Do not take this medicine with any of the following medications: -disulfiram -metronidazole This medicine may also interact with the following medications: -cyclosporine -diazepam -ketoconazole -medicines to increase blood counts like filgrastim, pegfilgrastim, sargramostim -other chemotherapy drugs like cisplatin, doxorubicin,  epirubicin, etoposide, teniposide, vincristine -quinidine -testosterone -vaccines -verapamil Talk to your doctor or health care professional before taking any of these medicines: -acetaminophen -aspirin -ibuprofen -ketoprofen -naproxen This list may not describe all possible interactions. Give your health care provider a list of all the medicines, herbs, non-prescription drugs, or dietary supplements you use. Also tell them if you smoke, drink alcohol, or use illegal drugs. Some items may interact with your medicine. What should I watch for while using this medicine? Your condition will be monitored carefully while you are receiving this medicine. You will need important blood work done while you are taking this medicine. This drug may make you feel generally unwell. This is not uncommon, as chemotherapy can affect healthy cells as well as cancer cells. Report any side effects. Continue your course of treatment even though you feel ill unless your doctor tells you to stop. In some cases, you may be given additional medicines to help with side effects. Follow all directions for their use. Call your doctor or health care professional for advice if you get a fever, chills or sore throat, or other symptoms of a cold or flu. Do not treat yourself. This drug decreases your body's ability to fight infections. Try to avoid being around people who are sick. This medicine may increase your risk to bruise or bleed. Call your doctor or health care professional if you notice any unusual bleeding. Be careful brushing and flossing your teeth or using a toothpick because you may get an infection or bleed more easily. If you have any dental work done, tell your dentist you are receiving this medicine. Avoid taking products that contain aspirin, acetaminophen, ibuprofen, naproxen, or ketoprofen unless instructed by your doctor. These medicines may hide a fever.   Do not become pregnant while taking this medicine.  Women should inform their doctor if they wish to become pregnant or think they might be pregnant. There is a potential for serious side effects to an unborn child. Talk to your health care professional or pharmacist for more information. Do not breast-feed an infant while taking this medicine. Men are advised not to father a child while receiving this medicine. What side effects may I notice from receiving this medicine? Side effects that you should report to your doctor or health care professional as soon as possible: -allergic reactions like skin rash, itching or hives, swelling of the face, lips, or tongue -low blood counts - This drug may decrease the number of white blood cells, red blood cells and platelets. You may be at increased risk for infections and bleeding. -signs of infection - fever or chills, cough, sore throat, pain or difficulty passing urine -signs of decreased platelets or bleeding - bruising, pinpoint red spots on the skin, black, tarry stools, nosebleeds -signs of decreased red blood cells - unusually weak or tired, fainting spells, lightheadedness -breathing problems -chest pain -high or low blood pressure -mouth sores -nausea and vomiting -pain, swelling, redness or irritation at the injection site -pain, tingling, numbness in the hands or feet -slow or irregular heartbeat -swelling of the ankle, feet, hands Side effects that usually do not require medical attention (report to your doctor or health care professional if they continue or are bothersome): -bone pain -complete hair loss including hair on your head, underarms, pubic hair, eyebrows, and eyelashes -changes in the color of fingernails -diarrhea -loosening of the fingernails -loss of appetite -muscle or joint pain -red flush to skin -sweating This list may not describe all possible side effects. Call your doctor for medical advice about side effects. You may report side effects to FDA at  1-800-FDA-1088. Where should I keep my medicine? This drug is given in a hospital or clinic and will not be stored at home. NOTE: This sheet is a summary. It may not cover all possible information. If you have questions about this medicine, talk to your doctor, pharmacist, or health care provider.  2015, Elsevier/Gold Standard. (2012-12-29 16:41:21)  Carboplatin injection What is this medicine? CARBOPLATIN (KAR boe pla tin) is a chemotherapy drug. It targets fast dividing cells, like cancer cells, and causes these cells to die. This medicine is used to treat ovarian cancer and many other cancers. This medicine may be used for other purposes; ask your health care provider or pharmacist if you have questions. COMMON BRAND NAME(S): Paraplatin What should I tell my health care provider before I take this medicine? They need to know if you have any of these conditions: -blood disorders -hearing problems -kidney disease -recent or ongoing radiation therapy -an unusual or allergic reaction to carboplatin, cisplatin, other chemotherapy, other medicines, foods, dyes, or preservatives -pregnant or trying to get pregnant -breast-feeding How should I use this medicine? This drug is usually given as an infusion into a vein. It is administered in a hospital or clinic by a specially trained health care professional. Talk to your pediatrician regarding the use of this medicine in children. Special care may be needed. Overdosage: If you think you have taken too much of this medicine contact a poison control center or emergency room at once. NOTE: This medicine is only for you. Do not share this medicine with others. What if I miss a dose? It is important not to miss a dose. Call your   doctor or health care professional if you are unable to keep an appointment. What may interact with this medicine? -medicines for seizures -medicines to increase blood counts like filgrastim, pegfilgrastim,  sargramostim -some antibiotics like amikacin, gentamicin, neomycin, streptomycin, tobramycin -vaccines Talk to your doctor or health care professional before taking any of these medicines: -acetaminophen -aspirin -ibuprofen -ketoprofen -naproxen This list may not describe all possible interactions. Give your health care provider a list of all the medicines, herbs, non-prescription drugs, or dietary supplements you use. Also tell them if you smoke, drink alcohol, or use illegal drugs. Some items may interact with your medicine. What should I watch for while using this medicine? Your condition will be monitored carefully while you are receiving this medicine. You will need important blood work done while you are taking this medicine. This drug may make you feel generally unwell. This is not uncommon, as chemotherapy can affect healthy cells as well as cancer cells. Report any side effects. Continue your course of treatment even though you feel ill unless your doctor tells you to stop. In some cases, you may be given additional medicines to help with side effects. Follow all directions for their use. Call your doctor or health care professional for advice if you get a fever, chills or sore throat, or other symptoms of a cold or flu. Do not treat yourself. This drug decreases your body's ability to fight infections. Try to avoid being around people who are sick. This medicine may increase your risk to bruise or bleed. Call your doctor or health care professional if you notice any unusual bleeding. Be careful brushing and flossing your teeth or using a toothpick because you may get an infection or bleed more easily. If you have any dental work done, tell your dentist you are receiving this medicine. Avoid taking products that contain aspirin, acetaminophen, ibuprofen, naproxen, or ketoprofen unless instructed by your doctor. These medicines may hide a fever. Do not become pregnant while taking this  medicine. Women should inform their doctor if they wish to become pregnant or think they might be pregnant. There is a potential for serious side effects to an unborn child. Talk to your health care professional or pharmacist for more information. Do not breast-feed an infant while taking this medicine. What side effects may I notice from receiving this medicine? Side effects that you should report to your doctor or health care professional as soon as possible: -allergic reactions like skin rash, itching or hives, swelling of the face, lips, or tongue -signs of infection - fever or chills, cough, sore throat, pain or difficulty passing urine -signs of decreased platelets or bleeding - bruising, pinpoint red spots on the skin, black, tarry stools, nosebleeds -signs of decreased red blood cells - unusually weak or tired, fainting spells, lightheadedness -breathing problems -changes in hearing -changes in vision -chest pain -high blood pressure -low blood counts - This drug may decrease the number of white blood cells, red blood cells and platelets. You may be at increased risk for infections and bleeding. -nausea and vomiting -pain, swelling, redness or irritation at the injection site -pain, tingling, numbness in the hands or feet -problems with balance, talking, walking -trouble passing urine or change in the amount of urine Side effects that usually do not require medical attention (report to your doctor or health care professional if they continue or are bothersome): -hair loss -loss of appetite -metallic taste in the mouth or changes in taste This list may not describe all   possible side effects. Call your doctor for medical advice about side effects. You may report side effects to FDA at 1-800-FDA-1088. Where should I keep my medicine? This drug is given in a hospital or clinic and will not be stored at home. NOTE: This sheet is a summary. It may not cover all possible information. If you  have questions about this medicine, talk to your doctor, pharmacist, or health care provider.  2015, Elsevier/Gold Standard. (2008-02-10 14:38:05)  

## 2015-08-10 ENCOUNTER — Encounter: Payer: Self-pay | Admitting: *Deleted

## 2015-08-10 ENCOUNTER — Encounter
Admission: RE | Disposition: A | Discharge status: Home / Self Care | Payer: Self-pay | Source: Ambulatory Visit | Attending: Vascular Surgery

## 2015-08-10 ENCOUNTER — Ambulatory Visit
Admission: RE | Admit: 2015-08-10 | Discharge: 2015-08-10 | Disposition: A | Discharge status: Home / Self Care | Payer: Medicare Other | Source: Ambulatory Visit | Attending: Vascular Surgery | Admitting: Vascular Surgery

## 2015-08-10 DIAGNOSIS — Z79899 Other long term (current) drug therapy: Secondary | ICD-10-CM | POA: Insufficient documentation

## 2015-08-10 DIAGNOSIS — C569 Malignant neoplasm of unspecified ovary: Secondary | ICD-10-CM | POA: Diagnosis not present

## 2015-08-10 DIAGNOSIS — K219 Gastro-esophageal reflux disease without esophagitis: Secondary | ICD-10-CM | POA: Diagnosis not present

## 2015-08-10 DIAGNOSIS — E780 Pure hypercholesterolemia: Secondary | ICD-10-CM | POA: Insufficient documentation

## 2015-08-10 DIAGNOSIS — H409 Unspecified glaucoma: Secondary | ICD-10-CM | POA: Diagnosis not present

## 2015-08-10 HISTORY — DX: Malignant (primary) neoplasm, unspecified: C80.1

## 2015-08-10 HISTORY — PX: PERIPHERAL VASCULAR CATHETERIZATION: SHX172C

## 2015-08-10 HISTORY — DX: Anxiety disorder, unspecified: F41.9

## 2015-08-10 HISTORY — DX: Essential (primary) hypertension: I10

## 2015-08-10 SURGERY — PORTA CATH INSERTION
Anesthesia: Moderate Sedation

## 2015-08-10 MED ORDER — HEPARIN (PORCINE) IN NACL 2-0.9 UNIT/ML-% IJ SOLN
INTRAMUSCULAR | Status: AC
  Filled 2015-08-10: qty 500

## 2015-08-10 MED ORDER — FENTANYL CITRATE (PF) 100 MCG/2ML IJ SOLN
INTRAMUSCULAR | Status: DC | PRN
  Administered 2015-08-10: 50 ug via INTRAVENOUS

## 2015-08-10 MED ORDER — CEFAZOLIN SODIUM 1-5 GM-% IV SOLN
INTRAVENOUS | Status: AC
  Filled 2015-08-10: qty 50

## 2015-08-10 MED ORDER — SODIUM CHLORIDE 0.9 % IR SOLN
Freq: Once | Status: AC
  Administered 2015-08-10: 14:00:00
  Filled 2015-08-10: qty 2

## 2015-08-10 MED ORDER — FENTANYL CITRATE (PF) 100 MCG/2ML IJ SOLN
INTRAMUSCULAR | Status: AC
  Filled 2015-08-10: qty 2

## 2015-08-10 MED ORDER — CEFUROXIME SODIUM 1.5 G IJ SOLR
1.5000 g | INTRAMUSCULAR | Status: AC
  Administered 2015-08-10: 1.5 g via INTRAVENOUS

## 2015-08-10 MED ORDER — MIDAZOLAM HCL 5 MG/5ML IJ SOLN
INTRAMUSCULAR | Status: AC
Start: 2015-08-10 — End: 2015-08-10
  Filled 2015-08-10: qty 5

## 2015-08-10 MED ORDER — LIDOCAINE-EPINEPHRINE (PF) 1 %-1:200000 IJ SOLN
INTRAMUSCULAR | Status: AC
  Filled 2015-08-10: qty 30

## 2015-08-10 MED ORDER — DEXTROSE 5 % IV SOLN
INTRAVENOUS | Status: AC
  Filled 2015-08-10: qty 1.5

## 2015-08-10 MED ORDER — IOHEXOL 300 MG/ML  SOLN
INTRAMUSCULAR | Status: DC | PRN
  Administered 2015-08-10: 9 mL via INTRAVENOUS

## 2015-08-10 MED ORDER — LIDOCAINE-EPINEPHRINE (PF) 1 %-1:200000 IJ SOLN
INTRAMUSCULAR | Status: DC | PRN
  Administered 2015-08-10: 20 mL via INTRADERMAL

## 2015-08-10 MED ORDER — MIDAZOLAM HCL 2 MG/2ML IJ SOLN
INTRAMUSCULAR | Status: DC | PRN
  Administered 2015-08-10: 2 mg via INTRAVENOUS

## 2015-08-10 MED ORDER — SODIUM CHLORIDE 0.9 % IV SOLN
INTRAVENOUS | Status: DC
  Administered 2015-08-10 (×2): via INTRAVENOUS

## 2015-08-10 MED ORDER — CHLORHEXIDINE GLUCONATE CLOTH 2 % EX PADS: 6.0000 | MEDICATED_PAD | Freq: Once | CUTANEOUS | Status: DC

## 2015-08-10 SURGICAL SUPPLY — 12 items
DERMABOND ADVANCED (GAUZE/BANDAGES/DRESSINGS) ×2
DERMABOND ADVANCED .7 DNX12 (GAUZE/BANDAGES/DRESSINGS) ×1 IMPLANT
GLIDEWIRE STIFF .35X180X3 HYDR (WIRE) ×3 IMPLANT
PACK ANGIOGRAPHY (CUSTOM PROCEDURE TRAY) ×3 IMPLANT
PAD GROUND ADULT SPLIT (MISCELLANEOUS) ×3 IMPLANT
PENCIL ELECTRO HAND CTR (MISCELLANEOUS) ×3 IMPLANT
PORTACATH POWER 8F (Port) ×3 IMPLANT
PREP CHG 10.5 TEAL (MISCELLANEOUS) ×3 IMPLANT
SUT MNCRL AB 4-0 PS2 18 (SUTURE) ×3 IMPLANT
SUT PROLENE 0 CT 1 30 (SUTURE) ×3 IMPLANT
SUTURE VIC 3-0 (SUTURE) ×3 IMPLANT
TOWEL OR 17X26 4PK STRL BLUE (TOWEL DISPOSABLE) ×3 IMPLANT

## 2015-08-10 NOTE — H&P (Signed)
  [facility] History & Physical Update  The patient was interviewed and re-examined.  The patient's previous History and Physical has been reviewed and is unchanged.  There is no change in the plan of care. We plan to proceed with the scheduled procedure.  DEW,JASON, MD  08/10/2015, 1:13 PM

## 2015-08-10 NOTE — Discharge Instructions (Signed)
Tunneled Catheter Insertion, Care After °Refer to this sheet in the next few weeks. These instructions provide you with information on caring for yourself after your procedure. Your caregiver may also give you more specific instructions. Your treatment has been planned according to current medical practices, but problems sometimes occur. Call your caregiver if you have any problems or questions after your procedure.  °HOME CARE INSTRUCTIONS °· Rest at home the day of the procedure. You will likely be able to return to normal activities the following day. °· Follow your caregiver's specific instructions for the type of device that you have. °· Only take over-the-counter or prescription medicines as directed by your caregiver. °· Keep the insertion site of the catheter clean and dry at all times. °¨ Change the bandages (dressings) over the catheter site as directed by your caregiver. °¨ Wash the area around the catheter site during each dressing change. Sponge bathe the area using a germ-killing (antiseptic) solution as directed by your caregiver. °¨ Look for redness or swelling at the insertion site during each dressing change. °· Apply an antibiotic ointment as directed by your caregiver. °· Flush your catheter as directed to keep it from becoming clogged. °· Always wash your hands thoroughly before changing dressings or flushing the catheter. °· Do not let air enter the catheter. °¨ Never open the cap at the catheter tip. °¨ Always make sure there is no air in the syringe or in the tubing for infusions.    °· Do not lift anything heavy. °· Do not drive until your caregiver approves. °· Do not shower or bathe until your caregiver approves. When you shower or bathe, place a piece of plastic wrap over the catheter site. Do not allow the catheter site or the dressing to get wet. If taking a bath, do not allow the catheter to get submerged in the water. °If the catheter was inserted through an arm vein:  °· Avoid  wearing tight clothes or jewelry on the arm that has the catheter.   °· Do not sleep with your head on the arm that has the catheter.   °· Do not allow use of a blood pressure cuff on the arm that has the catheter.   °· Do not let anyone draw blood from the arm that has the catheter, except through the catheter itself. °SEEK MEDICAL CARE IF: °· You have bleeding at the insertion site of the catheter.   °· You feel weak or nauseous.   °· Your catheter is not working properly.   °· You have redness, pain, swelling, and warmth at the insertion site.   °· You notice fluid draining from the insertion site.   °SEEK IMMEDIATE MEDICAL CARE IF: °· Your catheter breaks or has a hole in it.   °· Your catheter comes loose or gets pulled completely out. If this happens, hold firm pressure over the area with your hand or a clean cloth.   °· You have a fever. °· You have chills.   °· Your catheter becomes totally blocked.   °· You have swelling in your arm, shoulder, neck, or face.   °· You have bleeding from the insertion site that does not stop.   °· You develop chest pain or have trouble breathing.   °· You feel dizzy or faint.   °MAKE SURE YOU: °· Understand these instructions. °· Will watch your condition. °· Will get help right away if you are not doing well or get worse. °Document Released: 10/22/2012 Document Revised: 07/08/2013 Document Reviewed: 10/22/2012 °ExitCare® Patient Information ©2015 [employer]. This information is not intended to replace advice   given to you by your health care provider. Make sure you discuss any questions you have with your health care provider.

## 2015-08-10 NOTE — Op Note (Signed)
      [facility]       Operative Note  Date: 08/10/2015  Preoperative diagnosis:  1. Ovarian cancer  Postoperative diagnosis:  Same as above  Procedures: #1. Ultrasound guidance for vascular access to the right internal jugular vein. #2. Needle venogram of the jugular vein and SVC #2. Fluoroscopic guidance for placement of catheter. #3. Placement of CT compatible Port-A-Cath, right internal jugular vein.  Surgeon: Leotis Pain, MD.   Anesthesia: Local with moderate conscious sedation.  Fluoroscopy time: 2 minutes  Contrast used: 5 cc  Estimated blood loss: 25 cc  Indication for the procedure:  The patient has ovarian cancer and is scheduled for chemotherapy.  The patient needs a Port-A-Cath for durable venous access, chemotherapy, lab draws, and CT scans. We are asked to place this. Risks and benefits were discussed and informed consent was obtained.  Description of procedure: The patient was brought to the vascular and interventional radiology suite. The right neck chest and shoulder were sterilely prepped and draped, and a sterile surgical field was created. Ultrasound was used to help visualize a patent right internal jugular vein. This was then accessed under direct ultrasound guidance without difficulty with the Seldinger needle and a permanent image was recorded. A J-wire was placed but would not pass despite multiple attempts.  I then performed a venogram through the needle to evaluate the jugular vein and SVC.  The jugular vein was significantly tortuous but had no high grade stenosis.  The SVC was widely patent. After skin nick and dilatation, the peel-away sheath was then placed over the wire. I then anesthetized an area under the clavicle approximately 1-2 fingerbreadths. A transverse incision was created and an inferior pocket was created with electrocautery and blunt dissection. The port was then brought onto the field, placed into the pocket and secured to  the chest wall with 2 Prolene sutures. The catheter was connected to the port and tunneled from the subclavicular incision to the access site. Fluoroscopic guidance was then used to cut the catheter to an appropriate length. The catheter was then placed through the peel-away sheath and the peel-away sheath was removed. The catheter tip was parked in excellent location under fluorocoscopic guidance in the mid SVC. The pocket was then irrigated with antibiotic impregnated saline and the wound was closed with a running 3-0 Vicryl and a 4-0 Monocryl. The access incision was closed with a single 4-0 Monocryl. The Huber needle was used to withdraw blood and flush the port with heparinized saline. Dermabond was then placed as a dressing. The patient tolerated the procedure well and was taken to the recovery room in stable condition.   DEW,JASON 08/10/2015 2:35 PM

## 2015-08-11 ENCOUNTER — Inpatient Hospital Stay: Payer: Medicare Other

## 2015-08-11 ENCOUNTER — Encounter: Payer: Self-pay | Admitting: Vascular Surgery

## 2015-08-11 ENCOUNTER — Telehealth: Payer: Self-pay | Admitting: *Deleted

## 2015-08-11 DIAGNOSIS — C569 Malignant neoplasm of unspecified ovary: Secondary | ICD-10-CM

## 2015-08-11 MED ORDER — LIDOCAINE-PRILOCAINE 2.5-2.5 % EX CREA: 1.0000 "application " | TOPICAL_CREAM | CUTANEOUS | Status: DC | PRN

## 2015-08-11 NOTE — Telephone Encounter (Signed)
-----   Message from Clent Jacks, RN sent at 08/11/2015  3:57 PM EDT ----- Needs EMLA cream sent in. She will pick it up tomm. Starts chemo Monday she said

## 2015-08-11 NOTE — Telephone Encounter (Signed)
Prescription for emla cream escribed to [facility].

## 2015-08-15 ENCOUNTER — Encounter: Payer: Self-pay | Admitting: Oncology

## 2015-08-15 ENCOUNTER — Inpatient Hospital Stay (HOSPITAL_BASED_OUTPATIENT_CLINIC_OR_DEPARTMENT_OTHER): Payer: Medicare Other

## 2015-08-15 ENCOUNTER — Ambulatory Visit: Payer: Medicare Other | Admitting: Oncology

## 2015-08-15 ENCOUNTER — Other Ambulatory Visit: Payer: Self-pay | Admitting: Oncology

## 2015-08-15 ENCOUNTER — Inpatient Hospital Stay (HOSPITAL_BASED_OUTPATIENT_CLINIC_OR_DEPARTMENT_OTHER): Payer: Medicare Other | Admitting: Oncology

## 2015-08-15 ENCOUNTER — Inpatient Hospital Stay: Payer: Medicare Other

## 2015-08-15 VITALS — BP 144/82 | HR 80 | Temp 96.4°F | Wt 123.1 lb

## 2015-08-15 VITALS — BP 126/80 | HR 83 | Temp 97.1°F | Resp 18

## 2015-08-15 DIAGNOSIS — C786 Secondary malignant neoplasm of retroperitoneum and peritoneum: Secondary | ICD-10-CM

## 2015-08-15 DIAGNOSIS — C569 Malignant neoplasm of unspecified ovary: Secondary | ICD-10-CM

## 2015-08-15 DIAGNOSIS — T451X5A Adverse effect of antineoplastic and immunosuppressive drugs, initial encounter: Secondary | ICD-10-CM

## 2015-08-15 DIAGNOSIS — Y92531 Health care provider office as the place of occurrence of the external cause: Secondary | ICD-10-CM

## 2015-08-15 DIAGNOSIS — R5383 Other fatigue: Secondary | ICD-10-CM

## 2015-08-15 DIAGNOSIS — R21 Rash and other nonspecific skin eruption: Secondary | ICD-10-CM

## 2015-08-15 DIAGNOSIS — R531 Weakness: Secondary | ICD-10-CM

## 2015-08-15 DIAGNOSIS — E780 Pure hypercholesterolemia: Secondary | ICD-10-CM

## 2015-08-15 DIAGNOSIS — R110 Nausea: Secondary | ICD-10-CM

## 2015-08-15 DIAGNOSIS — Z780 Asymptomatic menopausal state: Secondary | ICD-10-CM

## 2015-08-15 DIAGNOSIS — K219 Gastro-esophageal reflux disease without esophagitis: Secondary | ICD-10-CM

## 2015-08-15 DIAGNOSIS — R000 Tachycardia, unspecified: Secondary | ICD-10-CM

## 2015-08-15 DIAGNOSIS — F419 Anxiety disorder, unspecified: Secondary | ICD-10-CM

## 2015-08-15 DIAGNOSIS — Z5111 Encounter for antineoplastic chemotherapy: Secondary | ICD-10-CM | POA: Diagnosis not present

## 2015-08-15 DIAGNOSIS — I959 Hypotension, unspecified: Secondary | ICD-10-CM

## 2015-08-15 DIAGNOSIS — Z90722 Acquired absence of ovaries, bilateral: Secondary | ICD-10-CM

## 2015-08-15 DIAGNOSIS — Z7902 Long term (current) use of antithrombotics/antiplatelets: Secondary | ICD-10-CM

## 2015-08-15 DIAGNOSIS — Z79899 Other long term (current) drug therapy: Secondary | ICD-10-CM

## 2015-08-15 DIAGNOSIS — I10 Essential (primary) hypertension: Secondary | ICD-10-CM

## 2015-08-15 DIAGNOSIS — R0602 Shortness of breath: Secondary | ICD-10-CM

## 2015-08-15 DIAGNOSIS — Z791 Long term (current) use of non-steroidal anti-inflammatories (NSAID): Secondary | ICD-10-CM

## 2015-08-15 MED ORDER — SODIUM CHLORIDE 0.9 % IV SOLN
Freq: Once | INTRAVENOUS | Status: AC
  Administered 2015-08-15: 12:00:00 via INTRAVENOUS
  Filled 2015-08-15: qty 1000

## 2015-08-15 MED ORDER — SODIUM CHLORIDE 0.9 % IV SOLN
375.0000 mg | Freq: Once | INTRAVENOUS | Status: DC
  Filled 2015-08-15: qty 38

## 2015-08-15 MED ORDER — SODIUM CHLORIDE 0.9 % IJ SOLN
10.0000 mL | INTRAMUSCULAR | Status: DC | PRN
  Filled 2015-08-15: qty 10

## 2015-08-15 MED ORDER — PACLITAXEL CHEMO INJECTION 300 MG/50ML
80.0000 mg/m2 | Freq: Once | INTRAVENOUS | Status: AC
  Administered 2015-08-15: 120 mg via INTRAVENOUS
  Filled 2015-08-15: qty 20

## 2015-08-15 MED ORDER — DIPHENHYDRAMINE HCL 50 MG/ML IJ SOLN
50.0000 mg | Freq: Once | INTRAMUSCULAR | Status: AC
  Administered 2015-08-15: 50 mg via INTRAVENOUS
  Filled 2015-08-15: qty 1

## 2015-08-15 MED ORDER — SODIUM CHLORIDE 0.9 % IV SOLN
Freq: Once | INTRAVENOUS | Status: AC
  Administered 2015-08-15: 14:00:00 via INTRAVENOUS
  Filled 2015-08-15: qty 2

## 2015-08-15 MED ORDER — HEPARIN SOD (PORK) LOCK FLUSH 100 UNIT/ML IV SOLN
INTRAVENOUS | Status: AC
  Filled 2015-08-15: qty 5

## 2015-08-15 MED ORDER — SODIUM CHLORIDE 0.9 % IV SOLN
Freq: Once | INTRAVENOUS | Status: AC
  Administered 2015-08-15: 12:00:00 via INTRAVENOUS
  Filled 2015-08-15: qty 5

## 2015-08-15 MED ORDER — PALONOSETRON HCL INJECTION 0.25 MG/5ML
0.2500 mg | Freq: Once | INTRAVENOUS | Status: AC
  Administered 2015-08-15: 0.25 mg via INTRAVENOUS
  Filled 2015-08-15: qty 5

## 2015-08-15 MED ORDER — FAMOTIDINE IN NACL 20-0.9 MG/50ML-% IV SOLN
20.0000 mg | Freq: Once | INTRAVENOUS | Status: AC
  Administered 2015-08-15: 20 mg via INTRAVENOUS
  Filled 2015-08-15: qty 50

## 2015-08-15 MED ORDER — HEPARIN SOD (PORK) LOCK FLUSH 100 UNIT/ML IV SOLN
500.0000 [IU] | Freq: Once | INTRAVENOUS | Status: AC
  Administered 2015-08-15: 500 [IU] via INTRAVENOUS

## 2015-08-15 NOTE — Progress Notes (Signed)
[place] [facility] [phone]  Fax:([phone]) [place]: 1929/03/23  MR#: [id number]  IEP#:[id number]  Patient Care Team: Albina Billet, MD as PCP - General (Internal Medicine) [facility] Gaetana Michaelis, MD as Referring Physician (Obstetrics and Gynecology) Maceo Pro, MD as Referring Physician (Obstetrics and Gynecology)  CHIEF COMPLAINT: (oncology history)  Carcinoma of ovary status post exploratory laparotomy and biopsy Extensive disease stage IIIc T IIIcNX M0 tumor Suboptimal debulking (September, 2016) . BRCA mutation was negative August 15, 2015 Patient to start neoadjuvant chemotherapy with carboplatin and Taxol Patient developed acute reaction to Taxol chemotherapy (August 15, 2015)  VISIT DIAGNOSIS:  Ovarian cancer   ICD-9-CM ICD-10-CM   1. Ovarian cancer, unspecified laterality 183.0 C56.9 CBC with Differential      No history exists.    Oncology Flowsheet 07/21/2015 07/22/2015 07/22/2015 07/23/2015 07/23/2015 07/24/2015 08/15/2015  Day, Cycle - - - - - - Day 1, 1  dexamethasone (DECADRON) IJ - - - - - - -  dexamethasone (DECADRON) IV - - - - - - [ 12 mg ]  enoxaparin (LOVENOX) [place]   40 mg   40 mg   - -  fosaprepitant (EMEND) IV - - - - - - [ 150 mg ]  LORazepam (ATIVAN) PO 0.5 mg 0.5 mg 0.5 mg 0.5 mg 0.5 mg 0.5 mg -  ondansetron (ZOFRAN) IV - - - - - - [ 4 mg ]  PACLitaxel (TAXOL) IV - - - - - - 80 mg/m2  palonosetron (ALOXI) IV - - - - - - 0.25 mg    INTERVAL HISTORY: \81 year old lady with a history of ovarian cancer with suboptimal debulking being started on adjuvant chemotherapy.  Abdominal wound is gradually healing.  No nausea no vomiting poor appetite abdominal pain. Patient had a port placement Patient had attended chemotherapy class.patient had number of questions Number of questions  REVIEW OF SYSTEMS:   Gen. Status is improving.  Abdominal wound is gradually healing.  No nausea no vomiting.  Patient and  number of questions regarding chemotherapy had a port placement causing some discomfort at the area of port placement. All other systems have been reviewed.  (12 system) As per HPI. Otherwise, a complete review of systems is negatve.  PAST MEDICAL HISTORY: Past Medical History  Diagnosis Date  . Glaucoma   . GERD (gastroesophageal reflux disease)   . Hypercholesteremia   . PONV (postoperative nausea and vomiting)   . Hypertension   . Anxiety   . Cancer     PAST SURGICAL HISTORY: Past Surgical History  Procedure Laterality Date  . Abdominal hysterectomy    . Replacement total knee      Right  . Breast biopsy    . Hand surgery      Right  . Laparotomy Left 07/20/2015    Procedure: LAPAROTOMY with Left Salpingooophrectomy;  Surgeon: Honor Loh Ward, MD;  Location: [facility] OR[facility];  Service: Gynecology;  Laterality: Left;  . Laparotomy N/A 07/20/2015    Procedure: LAPAROTOMY;  Surgeon: Gillis Ends, MD;  Location: [facility] OR[facility];  Service: Gynecology;  Laterality: N/A;  . Salpingoophorectomy Right 07/20/2015    Procedure: SALPINGO OOPHORECTOMY;  Surgeon: Gillis Ends, MD;  Location: [facility];  Service: Gynecology;  Laterality: Right;  . Peripheral vascular catheterization N/A 08/10/2015    Procedure: Glori Luis Cath Insertion;  Surgeon: Algernon Huxley, MD;  Location: [facility];  Service: Cardiovascular;  Laterality: N/A;  FAMILY HISTORY No family history on file. There is no significant family history of breast cancer, ovarian cancer, colon cancer  GYNECOLOGIC HISTORY:  No LMP recorded. Patient has had a hysterectomy.     ADVANCED DIRECTIVES:  Patient does have advance healthcare directive, Patient   does not desire to make any changes  HEALTH MAINTENANCE: Social History  Substance Use Topics  . Smoking status: Never Smoker   . Smokeless tobacco: Never Used  . Alcohol Use: No      Allergies  Allergen Reactions  . Clindamycin Hcl Diarrhea    antibiotic  colitis  . Clindamycin/Lincomycin Diarrhea  . Codeine Anxiety    Current Outpatient Prescriptions  Medication Sig Dispense Refill  . acetaminophen (TYLENOL) 500 MG tablet Take 500 mg by mouth every 6 (six) hours as needed.     . Ascorbic Acid (VITAMIN C CR) 500 MG CPCR Take 1 capsule by mouth daily.     Marland Kitchen bismuth subsalicylate (PEPTO BISMOL) 262 MG chewable tablet Chew by mouth as needed.     Marland Kitchen dexamethasone (DECADRON) 4 MG tablet Take 1 tab by mouth twice a day the day before chemotherapy. 20 tablet 0  . enoxaparin (LOVENOX) 40 MG/0.4ML injection Inject 0.4 mLs (40 mg total) into the skin daily. 30 Syringe 2  . fluticasone (FLONASE) 50 MCG/ACT nasal spray Place 2 sprays into both nostrils daily as needed.     Marland Kitchen ibuprofen (ADVIL,MOTRIN) 600 MG tablet Take 1 tablet (600 mg total) by mouth every 6 (six) hours. 30 tablet 0  . lidocaine-prilocaine (EMLA) cream Apply 1 application topically as needed. 30 g 3  . LORazepam (ATIVAN) 0.5 MG tablet Take by mouth 2 (two) times daily.     Marland Kitchen LUMIGAN 0.01 % SOLN Place 1 drop into both eyes at bedtime.     . meloxicam (MOBIC) 15 MG tablet Take 15 mg by mouth daily.     Marland Kitchen menthol-cetylpyridinium (CEPACOL) 3 MG lozenge Take 1 lozenge (3 mg total) by mouth every 2 (two) hours as needed for sore throat. 100 tablet 12  . omeprazole (PRILOSEC) 20 MG capsule Take 20 mg by mouth daily.     . ondansetron (ZOFRAN) 4 MG tablet Take 1 tablet (4 mg total) by mouth every 6 (six) hours as needed for nausea. 30 tablet 3  . oxyCODONE (OXY IR/ROXICODONE) 5 MG immediate release tablet Take 1 tablet (5 mg total) by mouth every 4 (four) hours as needed for moderate pain. 30 tablet 0  . simvastatin (ZOCOR) 20 MG tablet Take 20 mg by mouth at bedtime.     . traMADol (ULTRAM) 50 MG tablet Take 1 tablet (50 mg total) by mouth every 6 (six) hours. 65 tablet 0   No current facility-administered medications for this visit.   Facility-Administered Medications Ordered in Other Visits   Medication Dose Route Frequency Provider Last Rate Last Dose  . CARBOplatin (PARAPLATIN) 380 mg in sodium chloride 0.9 % 100 mL chemo infusion  380 mg Intravenous Once Forest Gleason, MD   380 mg at 08/15/15 1540  . sodium chloride 0.9 % injection 10 mL  10 mL Intravenous PRN Forest Gleason, MD        OBJECTIVE: PHYSICAL EXAM: Gen. status: Patient is alert oriented not any acute distress.. Lymphatic system: Supraclavicular, cervical, axillary, inguinal lymph nodes are not palpable Cardiac exam revealed the PMI to be normally situated and sized. The rhythm was regular and no extrasystoles were noted during several minutes of auscultation. The first and second  heart sounds were normal and physiologic splitting of the second heart sound was noted. There were no murmurs, rubs, clicks, or gallops. Examination of the chest was unremarkable. There were no bony deformities, no asymmetry, and no other abnormalities. Abdomen: Soft no ascites no palpable masses.  Abdominal wound: Lower abdominal wound is still healing. Examination of the skin revealed no evidence of significant rashes, suspicious appearing nevi or other concerning lesions. Neurologically, the patient was awake, alert, and oriented to person, place and time. There were no obvious focal neurologic abnormalities.  Lower extremity no swelling Musculoskeletal system within normal limit   Filed Vitals:   08/15/15 0951  BP: 144/82  Pulse: 80  Temp: 96.4 F (35.8 C)     Body mass index is 24.85 kg/(m^2).    ECOG FS:1 - Symptomatic but completely ambulatory  LAB RESULTS:  No visits with results within 5 Day(s) from this visit. Latest known visit with results is:  Appointment on 08/03/2015  Component Date Value Ref Range Status  . WBC 08/03/2015 12.7* 3.6 - 11.0 K/uL Final  . RBC 08/03/2015 4.01  3.80 - 5.20 MIL/uL Final  . Hemoglobin 08/03/2015 11.3* 12.0 - 16.0 g/dL Final  . HCT 08/03/2015 35.0  35.0 - 47.0 % Final  . MCV 08/03/2015  87.2  80.0 - 100.0 fL Final  . MCH 08/03/2015 28.2  26.0 - 34.0 pg Final  . MCHC 08/03/2015 32.4  32.0 - 36.0 g/dL Final  . RDW 08/03/2015 14.4  11.5 - 14.5 % Final  . Platelets 08/03/2015 434  150 - 440 K/uL Final  . Neutrophils Relative % 08/03/2015 72   Final  . Neutro Abs 08/03/2015 9.2* 1.4 - 6.5 K/uL Final  . Lymphocytes Relative 08/03/2015 18   Final  . Lymphs Abs 08/03/2015 2.2  1.0 - 3.6 K/uL Final  . Monocytes Relative 08/03/2015 8   Final  . Monocytes Absolute 08/03/2015 1.0* 0.2 - 0.9 K/uL Final  . Eosinophils Relative 08/03/2015 1   Final  . Eosinophils Absolute 08/03/2015 0.1  0 - 0.7 K/uL Final  . Basophils Relative 08/03/2015 1   Final  . Basophils Absolute 08/03/2015 0.1  0 - 0.1 K/uL Final  . Sodium 08/03/2015 137  135 - 145 mmol/L Final  . Potassium 08/03/2015 4.2  3.5 - 5.1 mmol/L Final  . Chloride 08/03/2015 99* 101 - 111 mmol/L Final  . CO2 08/03/2015 28  22 - 32 mmol/L Final  . Glucose, Bld 08/03/2015 105* 65 - 99 mg/dL Final  . BUN 08/03/2015 8  6 - 20 mg/dL Final  . Creatinine, Ser 08/03/2015 0.69  0.44 - 1.00 mg/dL Final  . Calcium 08/03/2015 9.0  8.9 - 10.3 mg/dL Final  . Total Protein 08/03/2015 7.4  6.5 - 8.1 g/dL Final  . Albumin 08/03/2015 3.7  3.5 - 5.0 g/dL Final  . AST 08/03/2015 24  15 - 41 U/L Final  . ALT 08/03/2015 12* 14 - 54 U/L Final  . Alkaline Phosphatase 08/03/2015 69  38 - 126 U/L Final  . Total Bilirubin 08/03/2015 0.8  0.3 - 1.2 mg/dL Final  . GFR calc non Af Amer 08/03/2015 >60  >60 mL/min Final  . GFR calc Af Amer 08/03/2015 >60  >60 mL/min Final   Comment: (NOTE) The eGFR has been calculated using the CKD EPI equation. This calculation has not been validated in all clinical situations. eGFR's persistently <60 mL/min signify possible Chronic Kidney Disease.   . Anion gap 08/03/2015 10  5 - 15  Final  . Magnesium 08/03/2015 1.8  1.7 - 2.4 mg/dL Final  . CA 125 08/03/2015 84.1* 0.0 - 38.1 U/mL Final   Comment: (NOTE) Roche ECLIA  methodology Performed At: [facility] [address] [place], [place] [id number] Lindon Romp MD FO:[id number]      STUDIES: No results found.  ASSESSMENT:  Cancer of ovary status post suboptimal debulking surgery because of extensive disease stage III Neoadjuvant chemotherapy with carboplatinum Taxol Patient will receive second cycle second and cycle third a Avastin therapy.  After cycle 4 Patient will get a CT scan and consider exploratory laparotomy for interval  debulking surgery. 2.  Will  have to await for the wound healingfor starting Avastin therapy  Patient was explained all the side effects of chemotherapy.  And informed consent has been obtained Recent had a port placement and site looks within normal limits Patient will receive carboplatinum on day 1 with AUC of 6 and Taxol weekly with 80 mg/m All the questions that C had regarding chemotherapy has been answered Total duration of visit was30 minutes.  50% or more time was spent in counseling patient and family regarding prognosis and options of treatment and available resources     Patient expressed understanding and was in agreement with this plan. She also understands that She can call clinic at any time with any questions, concerns, or complaints.    No matching staging information was found for the patient.  Forest Gleason, MD   08/15/2015 8:13 PM

## 2015-08-15 NOTE — Progress Notes (Signed)
12:35 p.m.: Patient complains of feeling "hot", face is flush/red, and nauseated.  Paclitaxel stopped, VS taken (see flowsheet), called Dr. Oliva Bustard.  Dr. Oliva Bustard ordered Solucortef 100 mg and 12.5 Benadryl IV, administered, will be by to see patient.  After additional dose of 12.5 Benadryl patient's breathing/pulse rate slowed and patient became lethargic, not verbally responsive with continued breathes and pulse rate.  Retrieved crash cart, called code, Dr. Oliva Bustard came to assess patient.  Increased fluids,

## 2015-08-15 NOTE — Progress Notes (Signed)
I was called in the infusion center at patient did develop a rash and shortness of breath after Taxol was started.  Patient had received Decadron night before. Taxol was put on hold and patient received Solu-Cortef and Benadryl After a while CODE BLUE was called this patient became hypotensive lethargic.  And difficult to response My examination revealed patient to be somewhat lethargic.  Blood pressure was 60 systolic.  Tachycardia.  Not hypoxic with oxygen saturation was still 92% at room air We started IV fluid.  Oxygen. Monitor patient very carefully gradually blood percent improved.  Patient became more allergic.  Oxygen saturation improved. After a while reevaluation patient was complaining of nausea so IV Zofran was given Patient was reexamined was gradually getting stable blood pressure was improving IV fluid was discontinued.  Patient ambulated well without significant problem Patient's son was present and overall reaction to Taxol has been discussed with him. Patient was then sent home with advice to start possibility of Abraxane chemotherapy.  Total time in face to face in the infusion center was more than 50 minutes

## 2015-08-15 NOTE — Progress Notes (Signed)
   08/15/15 1230  Clinical Encounter Type  Visited With Patient  Visit Type Code  Referral From Nurse  Consult/Referral To Chaplain  Spiritual Encounters  Spiritual Needs Emotional  Responded to [employer] as the chaplain for the cancer center.  Pt was awake and responsive when I arrived.  Nursing staff told me they felt pt had a reaction to the chemotherapy she was receiving.  Pt was grateful for my visit and thanked me for coming.  [place] [phone]

## 2015-08-15 NOTE — Progress Notes (Signed)
Patient does have living will. Never smoked. 

## 2015-08-16 ENCOUNTER — Ambulatory Visit: Payer: Medicare Other

## 2015-08-16 MED ORDER — HYDROCORTISONE NA SUCCINATE PF 100 MG IJ SOLR
100.0000 mg | Freq: Once | INTRAMUSCULAR | Status: AC
  Administered 2015-08-15: 100 mg via INTRAVENOUS

## 2015-08-16 MED ORDER — DIPHENHYDRAMINE HCL 50 MG/ML IJ SOLN
12.5000 mg | Freq: Once | INTRAMUSCULAR | Status: AC
  Administered 2015-08-15: 12.5 mg via INTRAVENOUS

## 2015-08-16 MED ORDER — DIPHENHYDRAMINE HCL 50 MG/ML IJ SOLN: 12.5000 mg | Freq: Once | INTRAMUSCULAR | Status: AC

## 2015-08-16 MED ORDER — METHYLPREDNISOLONE SODIUM SUCC 125 MG IJ SOLR: 100.0000 mg | Freq: Once | INTRAMUSCULAR | Status: AC

## 2015-08-16 NOTE — Addendum Note (Signed)
Addended by: Daleen Bo on: 08/16/2015 05:37 PM   Modules accepted: Orders

## 2015-08-19 ENCOUNTER — Telehealth: Payer: Self-pay | Admitting: *Deleted

## 2015-08-19 ENCOUNTER — Other Ambulatory Visit: Payer: Self-pay | Admitting: Family Medicine

## 2015-08-19 NOTE — Telephone Encounter (Signed)
Concerned regarding her health right now, she has had diarrhea and is extremely weak. She has had 2 nose bleeds in past 12 hours. She was unable to get Chemo Monday due to reaction with Taxol. She is supposed to get chemo Monday, but he is unsure if she will be able to given her condition right now

## 2015-08-19 NOTE — Telephone Encounter (Signed)
If she is that bad, she should go to ER, otherwise we will see her Monday as planned per L Herring, AGNP-C. I spoke with [name] regarding this and he said he will keep this in mind.

## 2015-08-22 ENCOUNTER — Telehealth: Payer: Self-pay

## 2015-08-22 ENCOUNTER — Encounter: Payer: Self-pay | Admitting: Oncology

## 2015-08-22 ENCOUNTER — Inpatient Hospital Stay: Payer: Medicare Other | Attending: Oncology

## 2015-08-22 ENCOUNTER — Inpatient Hospital Stay (HOSPITAL_BASED_OUTPATIENT_CLINIC_OR_DEPARTMENT_OTHER): Payer: Medicare Other | Admitting: Oncology

## 2015-08-22 ENCOUNTER — Inpatient Hospital Stay: Payer: Medicare Other

## 2015-08-22 VITALS — BP 147/80 | HR 85 | Temp 96.2°F | Wt 123.4 lb

## 2015-08-22 DIAGNOSIS — E7800 Pure hypercholesterolemia, unspecified: Secondary | ICD-10-CM | POA: Insufficient documentation

## 2015-08-22 DIAGNOSIS — Z79899 Other long term (current) drug therapy: Secondary | ICD-10-CM | POA: Diagnosis not present

## 2015-08-22 DIAGNOSIS — R197 Diarrhea, unspecified: Secondary | ICD-10-CM | POA: Insufficient documentation

## 2015-08-22 DIAGNOSIS — C569 Malignant neoplasm of unspecified ovary: Secondary | ICD-10-CM

## 2015-08-22 DIAGNOSIS — K219 Gastro-esophageal reflux disease without esophagitis: Secondary | ICD-10-CM

## 2015-08-22 DIAGNOSIS — Z5111 Encounter for antineoplastic chemotherapy: Secondary | ICD-10-CM | POA: Insufficient documentation

## 2015-08-22 DIAGNOSIS — F419 Anxiety disorder, unspecified: Secondary | ICD-10-CM | POA: Insufficient documentation

## 2015-08-22 DIAGNOSIS — I10 Essential (primary) hypertension: Secondary | ICD-10-CM | POA: Insufficient documentation

## 2015-08-22 DIAGNOSIS — R531 Weakness: Secondary | ICD-10-CM | POA: Insufficient documentation

## 2015-08-22 DIAGNOSIS — Z23 Encounter for immunization: Secondary | ICD-10-CM | POA: Insufficient documentation

## 2015-08-22 LAB — CBC WITH DIFFERENTIAL/PLATELET
Basophils Absolute: 0.2 10*3/uL — ABNORMAL HIGH (ref 0–0.1)
Basophils Relative: 1 %
Eosinophils Absolute: 0.1 10*3/uL (ref 0–0.7)
Eosinophils Relative: 1 %
HEMATOCRIT: 34.1 % — AB (ref 35.0–47.0)
HEMOGLOBIN: 11 g/dL — AB (ref 12.0–16.0)
LYMPHS ABS: 2.8 10*3/uL (ref 1.0–3.6)
LYMPHS PCT: 21 %
MCH: 28 pg (ref 26.0–34.0)
MCHC: 32.3 g/dL (ref 32.0–36.0)
MCV: 86.4 fL (ref 80.0–100.0)
MONO ABS: 1.6 10*3/uL — AB (ref 0.2–0.9)
MONOS PCT: 12 %
NEUTROS ABS: 8.3 10*3/uL — AB (ref 1.4–6.5)
Neutrophils Relative %: 65 %
Platelets: 267 10*3/uL (ref 150–440)
RBC: 3.94 MIL/uL (ref 3.80–5.20)
RDW: 13.9 % (ref 11.5–14.5)
WBC: 13 10*3/uL — ABNORMAL HIGH (ref 3.6–11.0)

## 2015-08-22 LAB — BASIC METABOLIC PANEL
Anion gap: 7 (ref 5–15)
BUN: 11 mg/dL (ref 6–20)
CALCIUM: 8.2 mg/dL — AB (ref 8.9–10.3)
CHLORIDE: 95 mmol/L — AB (ref 101–111)
CO2: 28 mmol/L (ref 22–32)
CREATININE: 0.74 mg/dL (ref 0.44–1.00)
GFR calc Af Amer: >60 mL/min (ref >60)
GFR calc non Af Amer: >60 mL/min (ref >60)
GLUCOSE: 109 mg/dL — AB (ref 65–99)
Potassium: 4.3 mmol/L (ref 3.5–5.1)
Sodium: 130 mmol/L — ABNORMAL LOW (ref 135–145)

## 2015-08-22 MED ORDER — PALONOSETRON HCL INJECTION 0.25 MG/5ML
0.2500 mg | Freq: Once | INTRAVENOUS | Status: AC
  Administered 2015-08-22: 0.25 mg via INTRAVENOUS
  Filled 2015-08-22: qty 5

## 2015-08-22 MED ORDER — SODIUM CHLORIDE 0.9 % IV SOLN
370.0000 mg | Freq: Once | INTRAVENOUS | Status: AC
  Administered 2015-08-22: 370 mg via INTRAVENOUS
  Filled 2015-08-22: qty 37

## 2015-08-22 MED ORDER — PACLITAXEL PROTEIN-BOUND CHEMO INJECTION 100 MG
80.0000 mg/m2 | Freq: Once | INTRAVENOUS | Status: AC
  Administered 2015-08-22: 125 mg via INTRAVENOUS
  Filled 2015-08-22: qty 25

## 2015-08-22 MED ORDER — HEPARIN SOD (PORK) LOCK FLUSH 100 UNIT/ML IV SOLN
500.0000 [IU] | Freq: Once | INTRAVENOUS | Status: AC | PRN
  Administered 2015-08-22: 500 [IU]
  Filled 2015-08-22: qty 5

## 2015-08-22 MED ORDER — SODIUM CHLORIDE 0.9 % IV SOLN
Freq: Once | INTRAVENOUS | Status: AC
  Administered 2015-08-22: 11:00:00 via INTRAVENOUS
  Filled 2015-08-22: qty 5

## 2015-08-22 MED ORDER — SODIUM CHLORIDE 0.9 % IJ SOLN
10.0000 mL | INTRAMUSCULAR | Status: DC | PRN
  Administered 2015-08-22 (×2): 10 mL
  Filled 2015-08-22: qty 10

## 2015-08-22 MED ORDER — SODIUM CHLORIDE 0.9 % IV SOLN
Freq: Once | INTRAVENOUS | Status: AC
  Administered 2015-08-22: 11:00:00 via INTRAVENOUS
  Filled 2015-08-22: qty 1000

## 2015-08-22 NOTE — Progress Notes (Signed)
Patient does have living will.  Never smoked.   Patient c/o decreased appetite.  Has had some diarrhea.  Formed stool yesterday.  A little nausea.  Patient has two staples at incision site they would like to have removed. Placed yellow bracelet on patient.  Educated her to wear it to all appointments even lab only appts.

## 2015-08-22 NOTE — Progress Notes (Signed)
[place] @ [facility] [phone]) [phone]  Fax:(336) [place] OB: [birth year]  MR#: [id number]  UUE#:[id number]  Patient Care Team: Albina Billet, MD as PCP - General (Internal Medicine) [facility] Gaetana Michaelis, MD as Referring Physician (Obstetrics and Gynecology) Maceo Pro, MD as Referring Physician (Obstetrics and Gynecology)  CHIEF COMPLAINT: (oncology history)  Carcinoma of ovary status post exploratory laparotomy and biopsy Extensive disease stage IIIc T IIIcNX M0 tumor Suboptimal debulking (September, 2016) . BRCA mutation was negative August 22, 2015 Patient is here for ongoing evaluation and initiation of first cycle of chemotherapy.  Patient could not receive last chemotherapy because of Taxol reaction.  Now Abraxane has been substituted.  No chills.  No fever.  Appetite has been stable.  During in interim.  Patient had episodes of diarrhea most likely secondary to MiraLAX which was given for constipation.  Abdominal wound is healing.  Was evaluated by primary care gynecologist and according to patient was some kind of plastic stitches were placed. No abdominal pain.  Feeling week tired.  Family desires to get physiotherapy evaluation and home health nurses coming out helping. VISIT DIAGNOSIS:  Ovarian cancer   ICD-9-CM ICD-10-CM   1. Ovarian cancer, unspecified laterality ([place]) 183.0 C56.9 Comprehensive metabolic panel     Basic metabolic panel      No history exists.    Oncology Flowsheet 07/21/2015 07/22/2015 07/22/2015 07/23/2015 07/23/2015 07/24/2015 08/15/2015  Day, Cycle - - - - - - Day 1, 1  dexamethasone (DECADRON) IJ - - - - - - -  dexamethasone (DECADRON) IV - - - - - - [ 12 mg ]  enoxaparin (LOVENOX) [place]   40 mg   40 mg   - -  fosaprepitant (EMEND) IV - - - - - - [ 150 mg ]  LORazepam (ATIVAN) PO 0.5 mg 0.5 mg 0.5 mg 0.5 mg 0.5 mg 0.5 mg -  ondansetron (ZOFRAN) IV - - - - - - [ 4 mg ]  PACLitaxel (TAXOL) IV - - - - - - 80  mg/m2  palonosetron (ALOXI) IV - - - - - - 0.25 mg    INTERVAL HISTORY: \81 year old lady with a history of ovarian cancer with suboptimal debulking being started on adjuvant chemotherapy.  Abdominal wound is gradually healing.  No nausea no vomiting poor appetite abdominal pain. Patient had a port placement Patient had attended chemotherapy class.patient had number of questions Number of questions  REVIEW OF SYSTEMS:   Gen. Status is improving.  Abdominal wound is gradually healing.  No nausea no vomiting.  Patient and number of questions regarding chemotherapy had a port placement causing some discomfort at the area of port placement. All other systems have been reviewed.  (12 system) As per HPI. Otherwise, a complete review of systems is negatve.  PAST MEDICAL HISTORY: Past Medical History  Diagnosis Date  . Glaucoma   . GERD (gastroesophageal reflux disease)   . Hypercholesteremia   . PONV (postoperative nausea and vomiting)   . Hypertension   . Anxiety   . Cancer     PAST SURGICAL HISTORY: Past Surgical History  Procedure Laterality Date  . Abdominal hysterectomy    . Replacement total knee      Right  . Breast biopsy    . Hand surgery      Right  . Laparotomy Left 07/20/2015    Procedure: LAPAROTOMY with Left Salpingooophrectomy;  Surgeon: Honor Loh Ward, MD;  Location[facility]  ORS;  Service: Gynecology;  Laterality: Left;  . Laparotomy N/A 07/20/2015    Procedure: LAPAROTOMY;  Surgeon: Gillis Ends, MD;  Location: [facility] ORS;  Service: Gynecology;  Laterality: N/A;  . Salpingoophorectomy Right 07/20/2015    Procedure: SALPINGO OOPHORECTOMY;  Surgeon: Gillis Ends, MD;  Location: [facility] ORS;  Service: Gynecology;  Laterality: Right;  . Peripheral vascular catheterization N/A 08/10/2015    Procedure: Glori Luis Cath Insertion;  Surgeon: Algernon Huxley, MD;  Location: [place] CV LAB;  Service: Cardiovascular;  Laterality: N/A;    FAMILY HISTORY No family  history on file. There is no significant family history of breast cancer, ovarian cancer, colon cancer  GYNECOLOGIC HISTORY:  No LMP recorded. Patient has had a hysterectomy.     ADVANCED DIRECTIVES:  Patient does have advance healthcare directive, Patient   does not desire to make any changes  HEALTH MAINTENANCE: Social History  Substance Use Topics  . Smoking status: Never Smoker   . Smokeless tobacco: Never Used  . Alcohol Use: No      Allergies  Allergen Reactions  . Clindamycin Hcl Diarrhea    antibiotic colitis  . Clindamycin/Lincomycin Diarrhea  . Codeine Anxiety    Current Outpatient Prescriptions  Medication Sig Dispense Refill  . acetaminophen (TYLENOL) 500 MG tablet Take 500 mg by mouth every 6 (six) hours as needed.     . Ascorbic Acid (VITAMIN C CR) 500 MG CPCR Take 1 capsule by mouth daily.     Marland Kitchen bismuth subsalicylate (PEPTO BISMOL) 262 MG chewable tablet Chew by mouth as needed.     Marland Kitchen dexamethasone (DECADRON) 4 MG tablet Take 1 tab by mouth twice a day the day before chemotherapy. 20 tablet 0  . enoxaparin (LOVENOX) 40 MG/0.4ML injection Inject 0.4 mLs (40 mg total) into the skin daily. 30 Syringe 2  . fluticasone (FLONASE) 50 MCG/ACT nasal spray Place 2 sprays into both nostrils daily as needed.     Marland Kitchen ibuprofen (ADVIL,MOTRIN) 600 MG tablet Take 1 tablet (600 mg total) by mouth every 6 (six) hours. 30 tablet 0  . lidocaine-prilocaine (EMLA) cream Apply 1 application topically as needed. 30 g 3  . LORazepam (ATIVAN) 0.5 MG tablet Take by mouth 2 (two) times daily.     Marland Kitchen LUMIGAN 0.01 % SOLN Place 1 drop into both eyes at bedtime.     . meloxicam (MOBIC) 15 MG tablet Take 15 mg by mouth daily.     Marland Kitchen menthol-cetylpyridinium (CEPACOL) 3 MG lozenge Take 1 lozenge (3 mg total) by mouth every 2 (two) hours as needed for sore throat. 100 tablet 12  . omeprazole (PRILOSEC) 20 MG capsule Take 20 mg by mouth daily.     . ondansetron (ZOFRAN) 4 MG tablet Take 1 tablet (4 mg  total) by mouth every 6 (six) hours as needed for nausea. 30 tablet 3  . oxyCODONE (OXY IR/ROXICODONE) 5 MG immediate release tablet Take 1 tablet (5 mg total) by mouth every 4 (four) hours as needed for moderate pain. 30 tablet 0  . simvastatin (ZOCOR) 20 MG tablet Take 20 mg by mouth at bedtime.     . traMADol (ULTRAM) 50 MG tablet Take 1 tablet (50 mg total) by mouth every 6 (six) hours. 65 tablet 0   No current facility-administered medications for this visit.   Facility-Administered Medications Ordered in Other Visits  Medication Dose Route Frequency Provider Last Rate Last Dose  . diphenhydrAMINE (BENADRYL) injection 12.5 mg  12.5 mg Intravenous Once  Forest Gleason, MD      . heparin lock flush 100 unit/mL  500 Units Intracatheter Once PRN Forest Gleason, MD      . methylPREDNISolone sodium succinate (SOLU-MEDROL) 125 mg/2 mL injection 100 mg  100 mg Intravenous Once Forest Gleason, MD      . sodium chloride 0.9 % injection 10 mL  10 mL Intracatheter PRN Forest Gleason, MD   10 mL at 08/22/15 0906    OBJECTIVE: PHYSICAL EXAM: Gen. status: Patient is alert oriented not any acute distress.. Lymphatic system: Supraclavicular, cervical, axillary, inguinal lymph nodes are not palpable Cardiac exam revealed the PMI to be normally situated and sized. The rhythm was regular and no extrasystoles were noted during several minutes of auscultation. The first and second heart sounds were normal and physiologic splitting of the second heart sound was noted. There were no murmurs, rubs, clicks, or gallops. Examination of the chest was unremarkable. There were no bony deformities, no asymmetry, and no other abnormalities. Abdomen: Soft no ascites no palpable masses.  Abdominal wound: Lower abdominal wound is still healing. Examination of the skin revealed no evidence of significant rashes, suspicious appearing nevi or other concerning lesions. Neurologically, the patient was awake, alert, and oriented to  person, place and time. There were no obvious focal neurologic abnormalities.  Lower extremity no swelling Musculoskeletal system within normal limit   Filed Vitals:   08/22/15 0950  BP: 147/80  Pulse: 85  Temp: 96.2 F (35.7 C)     Body mass index is 24.91 kg/(m^2).    ECOG FS:1 - Symptomatic but completely ambulatory  LAB RESULTS:  Infusion on 08/22/2015  Component Date Value Ref Range Status  . WBC 08/22/2015 13.0* 3.6 - 11.0 K/uL Final   A-LINE DRAW  . RBC 08/22/2015 3.94  3.80 - 5.20 MIL/uL Final  . Hemoglobin 08/22/2015 11.0* 12.0 - 16.0 g/dL Final  . HCT 08/22/2015 34.1* 35.0 - 47.0 % Final  . MCV 08/22/2015 86.4  80.0 - 100.0 fL Final  . MCH 08/22/2015 28.0  26.0 - 34.0 pg Final  . MCHC 08/22/2015 32.3  32.0 - 36.0 g/dL Final  . RDW 08/22/2015 13.9  11.5 - 14.5 % Final  . Platelets 08/22/2015 267  150 - 440 K/uL Final  . Neutrophils Relative % 08/22/2015 65   Final  . Neutro Abs 08/22/2015 8.3* 1.4 - 6.5 K/uL Final  . Lymphocytes Relative 08/22/2015 21   Final  . Lymphs Abs 08/22/2015 2.8  1.0 - 3.6 K/uL Final  . Monocytes Relative 08/22/2015 12   Final  . Monocytes Absolute 08/22/2015 1.6* 0.2 - 0.9 K/uL Final  . Eosinophils Relative 08/22/2015 1   Final  . Eosinophils Absolute 08/22/2015 0.1  0 - 0.7 K/uL Final  . Basophils Relative 08/22/2015 1   Final  . Basophils Absolute 08/22/2015 0.2* 0 - 0.1 K/uL Final  . Sodium 08/22/2015 130* 135 - 145 mmol/L Final  . Potassium 08/22/2015 4.3  3.5 - 5.1 mmol/L Final  . Chloride 08/22/2015 95* 101 - 111 mmol/L Final  . CO2 08/22/2015 28  22 - 32 mmol/L Final  . Glucose, Bld 08/22/2015 109* 65 - 99 mg/dL Final  . BUN 08/22/2015 11  6 - 20 mg/dL Final  . Creatinine, Ser 08/22/2015 0.74  0.44 - 1.00 mg/dL Final  . Calcium 08/22/2015 8.2* 8.9 - 10.3 mg/dL Final  . GFR calc non Af Amer 08/22/2015 >60  >60 mL/min Final  . GFR calc Af Amer 08/22/2015 >60  >60  mL/min Final   Comment: (NOTE) The eGFR has been calculated using  the CKD EPI equation. This calculation has not been validated in all clinical situations. eGFR's persistently <60 mL/min signify possible Chronic Kidney Disease.   . Anion gap 08/22/2015 7  5 - 15 Final     STUDIES: No results found.  ASSESSMENT:  Cancer of ovary status post suboptimal debulking surgery because of extensive disease stage III Neoadjuvant chemotherapy with carboplatinum Taxol Patient will receive second cycle second and cycle third a Avastin therapy.  After cycle 4 Patient will get a CT scan and consider exploratory laparotomy for interval  debulking surgery. 2.  Will  have to await for the wound healingfor starting Avastin therapy Plan and recommendation Proceed with day 1 chemotherapy with carboplatinum and Abraxane.  All lab data has been reviewed. Patient will receive day 8 chemotherapy if CBC is within acceptable range Abdominal wound is healing. Diarrhea has improved patient has not lost any weight Patient has been referred to home health for further evaluation and physiotherapy consideration     Patient expressed understanding and was in agreement with this plan. She also understands that She can call clinic at any time with any questions, concerns, or complaints.    No matching staging information was found for the patient.  Forest Gleason, MD   08/22/2015 10:58 AM

## 2015-08-22 NOTE — Telephone Encounter (Signed)
  Oncology Nurse Navigator Documentation    Navigator Encounter Type: Telephone (08/22/15 1000)                      Time Spent with Patient: 30 (08/22/15 1000)  Received call from [facility] Friday 9/30 regarding increased weakness with his mom. He is concerned regarding care in the future. At present she is staying at his home since her surgery. With her decline he would like to start home care and look into options for placement in the future. He feels she may be to weak for treatment. She also had a nosebleed. He has notified triage regarding this. Life path initiated.

## 2015-08-29 ENCOUNTER — Inpatient Hospital Stay: Payer: Medicare Other

## 2015-08-29 VITALS — BP 121/77 | HR 93 | Temp 97.6°F | Resp 18

## 2015-08-29 DIAGNOSIS — C569 Malignant neoplasm of unspecified ovary: Secondary | ICD-10-CM

## 2015-08-29 LAB — CBC WITH DIFFERENTIAL/PLATELET
BASOS PCT: 1 %
Basophils Absolute: 0.1 10*3/uL (ref 0–0.1)
EOS ABS: 0.1 10*3/uL (ref 0–0.7)
EOS PCT: 2 %
HCT: 32.1 % — ABNORMAL LOW (ref 35.0–47.0)
Hemoglobin: 10.7 g/dL — ABNORMAL LOW (ref 12.0–16.0)
LYMPHS ABS: 1.7 10*3/uL (ref 1.0–3.6)
Lymphocytes Relative: 22 %
MCH: 28.5 pg (ref 26.0–34.0)
MCHC: 33.3 g/dL (ref 32.0–36.0)
MCV: 85.5 fL (ref 80.0–100.0)
MONOS PCT: 10 %
Monocytes Absolute: 0.7 10*3/uL (ref 0.2–0.9)
Neutro Abs: 4.9 10*3/uL (ref 1.4–6.5)
Neutrophils Relative %: 65 %
PLATELETS: 203 10*3/uL (ref 150–440)
RBC: 3.76 MIL/uL — ABNORMAL LOW (ref 3.80–5.20)
RDW: 14.3 % (ref 11.5–14.5)
WBC: 7.5 10*3/uL (ref 3.6–11.0)

## 2015-08-29 MED ORDER — SODIUM CHLORIDE 0.9 % IV SOLN
Freq: Once | INTRAVENOUS | Status: AC
Start: 2015-08-29 — End: 2015-08-29
  Administered 2015-08-29: 15:00:00 via INTRAVENOUS
  Filled 2015-08-29: qty 4

## 2015-08-29 MED ORDER — PACLITAXEL PROTEIN-BOUND CHEMO INJECTION 100 MG
80.0000 mg/m2 | Freq: Once | INTRAVENOUS | Status: AC
  Administered 2015-08-29: 125 mg via INTRAVENOUS
  Filled 2015-08-29: qty 25

## 2015-08-29 MED ORDER — SODIUM CHLORIDE 0.9 % IJ SOLN
10.0000 mL | INTRAMUSCULAR | Status: DC | PRN
  Administered 2015-08-29: 10 mL via INTRAVENOUS
  Filled 2015-08-29: qty 10

## 2015-08-29 MED ORDER — SODIUM CHLORIDE 0.9 % IV SOLN
Freq: Once | INTRAVENOUS | Status: AC
  Administered 2015-08-29: 15:00:00 via INTRAVENOUS
  Filled 2015-08-29: qty 1000

## 2015-08-29 MED ORDER — HEPARIN SOD (PORK) LOCK FLUSH 100 UNIT/ML IV SOLN
500.0000 [IU] | Freq: Once | INTRAVENOUS | Status: AC
  Administered 2015-08-29: 500 [IU] via INTRAVENOUS
  Filled 2015-08-29: qty 5

## 2015-09-01 DIAGNOSIS — C569 Malignant neoplasm of unspecified ovary: Secondary | ICD-10-CM | POA: Diagnosis not present

## 2015-09-05 ENCOUNTER — Inpatient Hospital Stay (HOSPITAL_BASED_OUTPATIENT_CLINIC_OR_DEPARTMENT_OTHER): Payer: Medicare Other | Admitting: Oncology

## 2015-09-05 ENCOUNTER — Encounter: Payer: Self-pay | Admitting: Oncology

## 2015-09-05 ENCOUNTER — Inpatient Hospital Stay: Payer: Medicare Other

## 2015-09-05 VITALS — BP 138/77 | HR 103 | Temp 98.3°F | Wt 121.9 lb

## 2015-09-05 DIAGNOSIS — R531 Weakness: Secondary | ICD-10-CM | POA: Diagnosis not present

## 2015-09-05 DIAGNOSIS — N838 Other noninflammatory disorders of ovary, fallopian tube and broad ligament: Secondary | ICD-10-CM

## 2015-09-05 DIAGNOSIS — C569 Malignant neoplasm of unspecified ovary: Secondary | ICD-10-CM | POA: Diagnosis not present

## 2015-09-05 DIAGNOSIS — F419 Anxiety disorder, unspecified: Secondary | ICD-10-CM

## 2015-09-05 DIAGNOSIS — E7800 Pure hypercholesterolemia, unspecified: Secondary | ICD-10-CM

## 2015-09-05 DIAGNOSIS — R197 Diarrhea, unspecified: Secondary | ICD-10-CM

## 2015-09-05 DIAGNOSIS — Z79899 Other long term (current) drug therapy: Secondary | ICD-10-CM | POA: Diagnosis not present

## 2015-09-05 DIAGNOSIS — K219 Gastro-esophageal reflux disease without esophagitis: Secondary | ICD-10-CM

## 2015-09-05 DIAGNOSIS — I10 Essential (primary) hypertension: Secondary | ICD-10-CM

## 2015-09-05 LAB — CBC WITH DIFFERENTIAL/PLATELET
BASOS ABS: 0 10*3/uL (ref 0–0.1)
Basophils Relative: 1 %
Eosinophils Absolute: 0 10*3/uL (ref 0–0.7)
Eosinophils Relative: 1 %
HEMATOCRIT: 31.7 % — AB (ref 35.0–47.0)
Hemoglobin: 10.5 g/dL — ABNORMAL LOW (ref 12.0–16.0)
LYMPHS ABS: 2.7 10*3/uL (ref 1.0–3.6)
Lymphocytes Relative: 51 %
MCH: 28.6 pg (ref 26.0–34.0)
MCHC: 33 g/dL (ref 32.0–36.0)
MCV: 86.6 fL (ref 80.0–100.0)
MONO ABS: 0.7 10*3/uL (ref 0.2–0.9)
Monocytes Relative: 14 %
NEUTROS ABS: 1.7 10*3/uL (ref 1.4–6.5)
NEUTROS PCT: 33 %
Platelets: 196 10*3/uL (ref 150–440)
RBC: 3.67 MIL/uL — AB (ref 3.80–5.20)
RDW: 14 % (ref 11.5–14.5)
WBC: 5.2 10*3/uL (ref 3.6–11.0)

## 2015-09-05 LAB — COMPREHENSIVE METABOLIC PANEL
ALT: 17 U/L (ref 14–54)
AST: 26 U/L (ref 15–41)
Albumin: 3.9 g/dL (ref 3.5–5.0)
Alkaline Phosphatase: 71 U/L (ref 38–126)
Anion gap: 8 (ref 5–15)
BILIRUBIN TOTAL: 0.3 mg/dL (ref 0.3–1.2)
BUN: 14 mg/dL (ref 6–20)
CO2: 27 mmol/L (ref 22–32)
CREATININE: 0.68 mg/dL (ref 0.44–1.00)
Calcium: 8.1 mg/dL — ABNORMAL LOW (ref 8.9–10.3)
Chloride: 99 mmol/L — ABNORMAL LOW (ref 101–111)
GFR calc Af Amer: >60 mL/min (ref >60)
Glucose, Bld: 127 mg/dL — ABNORMAL HIGH (ref 65–99)
Potassium: 3.7 mmol/L (ref 3.5–5.1)
Sodium: 134 mmol/L — ABNORMAL LOW (ref 135–145)
TOTAL PROTEIN: 7 g/dL (ref 6.5–8.1)

## 2015-09-05 MED ORDER — PACLITAXEL PROTEIN-BOUND CHEMO INJECTION 100 MG
80.0000 mg/m2 | Freq: Once | INTRAVENOUS | Status: AC
  Administered 2015-09-05: 125 mg via INTRAVENOUS
  Filled 2015-09-05: qty 25

## 2015-09-05 MED ORDER — INFLUENZA VAC SPLIT QUAD 0.5 ML IM SUSY
0.5000 mL | PREFILLED_SYRINGE | Freq: Once | INTRAMUSCULAR | Status: AC
  Administered 2015-09-05: 0.5 mL via INTRAMUSCULAR
  Filled 2015-09-05: qty 0.5

## 2015-09-05 MED ORDER — SODIUM CHLORIDE 0.9 % IV SOLN
Freq: Once | INTRAVENOUS | Status: AC
  Administered 2015-09-05: 15:00:00 via INTRAVENOUS
  Filled 2015-09-05: qty 4

## 2015-09-05 MED ORDER — SODIUM CHLORIDE 0.9 % IJ SOLN
10.0000 mL | Freq: Once | INTRAMUSCULAR | Status: AC
  Administered 2015-09-05: 10 mL via INTRAVENOUS
  Filled 2015-09-05: qty 10

## 2015-09-05 MED ORDER — SODIUM CHLORIDE 0.9 % IV SOLN
Freq: Once | INTRAVENOUS | Status: AC
  Administered 2015-09-05: 15:00:00 via INTRAVENOUS
  Filled 2015-09-05: qty 1000

## 2015-09-05 MED ORDER — HEPARIN SOD (PORK) LOCK FLUSH 100 UNIT/ML IV SOLN
500.0000 [IU] | Freq: Once | INTRAVENOUS | Status: AC
  Administered 2015-09-05: 500 [IU] via INTRAVENOUS
  Filled 2015-09-05: qty 5

## 2015-09-05 NOTE — Progress Notes (Signed)
Patient has been having nose bleeds.  Also asking if she can get flu shot.

## 2015-09-05 NOTE — Progress Notes (Signed)
[place] @ [facility] [phone]) [phone]  Fax:(336) [place] OB: 1929-10-31  MR#: [id number]  OMV#:[id number]  Patient Care Team: Albina Billet, MD as PCP - General (Internal Medicine) [facility] Gaetana Michaelis, MD as Referring Physician (Obstetrics and Gynecology) Maceo Pro, MD as Referring Physician (Obstetrics and Gynecology)  CHIEF COMPLAINT: (oncology history)  Carcinoma of ovary status post exploratory laparotomy and biopsy Extensive disease stage IIIc T IIIcNX M0 tumor Suboptimal debulking (September, 2016) . BRCA mutation was negative Patient is here for continuation of day 15 Abraxane chemotherapy.  Tolerating treatment very well.  Abdominal wound is healed.  Appetite is improving.  No nausea.  No vomiting.  No diarrhea.  Abdominal wound is healing.  Was evaluated by primary care gynecologist and according to patient was some kind of plastic stitches were placed. No abdominal pain.  Feeling week tired.  Family desires to get physiotherapy evaluation and home health nurses coming out helping. VISIT DIAGNOSIS:  Ovarian cancer No diagnosis found.    No history exists.    Oncology Flowsheet 07/22/2015 07/23/2015 07/23/2015 07/24/2015 08/15/2015 08/22/2015 08/29/2015  Day, Cycle - - - - Day 1, 1 Day 1, Cycle 1 Day 8, Cycle 1  CARBOplatin (PARAPLATIN) IV - - - - - 370 mg -  dexamethasone (DECADRON) IJ - - - - - - -  dexamethasone (DECADRON) IV - - - - [ 12 mg ] [ 12 mg ] [ 20 mg ]  enoxaparin (LOVENOX) [place]   40 mg   - - - -  fosaprepitant (EMEND) IV - - - - [ 150 mg ] [ 150 mg ] -  LORazepam (ATIVAN) PO 0.5 mg 0.5 mg 0.5 mg 0.5 mg - - -  ondansetron (ZOFRAN) IV - - - - [ 4 mg ] - [ 8 mg ]  PACLitaxel (TAXOL) IV - - - - 80 mg/m2 - -  PACLitaxel-protein bound (ABRAXANE) IV - - - - - 80 mg/m2 80 mg/m2  palonosetron (ALOXI) IV - - - - 0.25 mg 0.25 mg -    Review of system  general status: Patient is feeling weak and tired.  No change in a  performance status.  No chills.  No fever. HEENT:  No evidence of stomatitis Lungs: No cough or shortness of breath Cardiac: No chest pain or paroxysmal nocturnal dyspnea GI: No nausea no vomiting no diarrhea no abdominal pain Abdominal wound is healing well. Skin: No rash Lower extremity no swelling Neurological system: No tingling.  No numbness.  No other focal signs Musculoskeletal system no bony pains  PAST MEDICAL HISTORY: Past Medical History  Diagnosis Date  . Glaucoma   . GERD (gastroesophageal reflux disease)   . Hypercholesteremia   . PONV (postoperative nausea and vomiting)   . Hypertension   . Anxiety   . Cancer [facility])     PAST SURGICAL HISTORY: Past Surgical History  Procedure Laterality Date  . Abdominal hysterectomy    . Replacement total knee      Right  . Breast biopsy    . Hand surgery      Right  . Laparotomy Left 07/20/2015    Procedure: LAPAROTOMY with Left Salpingooophrectomy;  Surgeon: Honor Loh Ward, MD;  Location: [facility] ORS;  Service: Gynecology;  Laterality: Left;  . Laparotomy N/A 07/20/2015    Procedure: LAPAROTOMY;  Surgeon: Gillis Ends, MD;  Location: [facility] ORS;  Service: Gynecology;  Laterality: N/A;  . Salpingoophorectomy Right 07/20/2015  Procedure: SALPINGO OOPHORECTOMY;  Surgeon: Gillis Ends, MD;  Location: [facility] ORS;  Service: Gynecology;  Laterality: Right;  . Peripheral vascular catheterization N/A 08/10/2015    Procedure: Glori Luis Cath Insertion;  Surgeon: Algernon Huxley, MD;  Location: [place] CV LAB;  Service: Cardiovascular;  Laterality: N/A;    FAMILY HISTORY No family history on file. There is no significant family history of breast cancer, ovarian cancer, colon cancer  GYNECOLOGIC HISTORY:  No LMP recorded. Patient has had a hysterectomy.     ADVANCED DIRECTIVES:  Patient does have advance healthcare directive, Patient   does not desire to make any changes  HEALTH MAINTENANCE: Social History  Substance  Use Topics  . Smoking status: Never Smoker   . Smokeless tobacco: Never Used  . Alcohol Use: No      Allergies  Allergen Reactions  . Clindamycin Hcl Diarrhea    antibiotic colitis  . Clindamycin/Lincomycin Diarrhea  . Codeine Anxiety    Current Outpatient Prescriptions  Medication Sig Dispense Refill  . acetaminophen (TYLENOL) 500 MG tablet Take 500 mg by mouth every 6 (six) hours as needed.     . Ascorbic Acid (VITAMIN C CR) 500 MG CPCR Take 1 capsule by mouth daily.     Marland Kitchen bismuth subsalicylate (PEPTO BISMOL) 262 MG chewable tablet Chew by mouth as needed.     Marland Kitchen dexamethasone (DECADRON) 4 MG tablet Take 1 tab by mouth twice a day the day before chemotherapy. 20 tablet 0  . enoxaparin (LOVENOX) 40 MG/0.4ML injection Inject 0.4 mLs (40 mg total) into the skin daily. 30 Syringe 2  . fluticasone (FLONASE) 50 MCG/ACT nasal spray Place 2 sprays into both nostrils daily as needed.     Marland Kitchen ibuprofen (ADVIL,MOTRIN) 600 MG tablet Take 1 tablet (600 mg total) by mouth every 6 (six) hours. 30 tablet 0  . lidocaine-prilocaine (EMLA) cream Apply 1 application topically as needed. 30 g 3  . LORazepam (ATIVAN) 0.5 MG tablet Take by mouth 2 (two) times daily.     Marland Kitchen LUMIGAN 0.01 % SOLN Place 1 drop into both eyes at bedtime.     . meloxicam (MOBIC) 15 MG tablet Take 15 mg by mouth daily.     Marland Kitchen menthol-cetylpyridinium (CEPACOL) 3 MG lozenge Take 1 lozenge (3 mg total) by mouth every 2 (two) hours as needed for sore throat. 100 tablet 12  . omeprazole (PRILOSEC) 20 MG capsule Take 20 mg by mouth daily.     . ondansetron (ZOFRAN) 4 MG tablet Take 1 tablet (4 mg total) by mouth every 6 (six) hours as needed for nausea. 30 tablet 3  . oxyCODONE (OXY IR/ROXICODONE) 5 MG immediate release tablet Take 1 tablet (5 mg total) by mouth every 4 (four) hours as needed for moderate pain. 30 tablet 0  . simvastatin (ZOCOR) 20 MG tablet Take 20 mg by mouth at bedtime.     . traMADol (ULTRAM) 50 MG tablet Take 1  tablet (50 mg total) by mouth every 6 (six) hours. 65 tablet 0   No current facility-administered medications for this visit.   Facility-Administered Medications Ordered in Other Visits  Medication Dose Route Frequency Provider Last Rate Last Dose  . diphenhydrAMINE (BENADRYL) injection 12.5 mg  12.5 mg Intravenous Once Forest Gleason, MD      . heparin lock flush 100 unit/mL  500 Units Intravenous Once Forest Gleason, MD      . Influenza vac split quadrivalent PF (FLUARIX) injection 0.5 mL  0.5 mL Intramuscular  Once Forest Gleason, MD      . methylPREDNISolone sodium succinate (SOLU-MEDROL) 125 mg/2 mL injection 100 mg  100 mg Intravenous Once Forest Gleason, MD        OBJECTIVE: PHYSICAL EXAM: Gen. status: Patient is alert oriented not any acute distress.. Lymphatic system: Supraclavicular, cervical, axillary, inguinal lymph nodes are not palpable Cardiac exam revealed the PMI to be normally situated and sized. The rhythm was regular and no extrasystoles were noted during several minutes of auscultation. The first and second heart sounds were normal and physiologic splitting of the second heart sound was noted. There were no murmurs, rubs, clicks, or gallops. Examination of the chest was unremarkable. There were no bony deformities, no asymmetry, and no other abnormalities. Abdomen: Soft no ascites no palpable masses.  Abdominal wound has healed. Examination of the skin revealed no evidence of significant rashes, suspicious appearing nevi or other concerning lesions. Neurologically, the patient was awake, alert, and oriented to person, place and time. There were no obvious focal neurologic abnormalities.  Lower extremity no swelling Musculoskeletal system within normal limit   Filed Vitals:   09/05/15 1411  BP: 138/77  Pulse: 103  Temp: 98.3 F (36.8 C)     Body mass index is 24.61 kg/(m^2).    ECOG FS:1 - Symptomatic but completely ambulatory  LAB RESULTS:  Appointment on 09/05/2015    Component Date Value Ref Range Status  . WBC 09/05/2015 5.2  3.6 - 11.0 K/uL Final  . RBC 09/05/2015 3.67* 3.80 - 5.20 MIL/uL Final  . Hemoglobin 09/05/2015 10.5* 12.0 - 16.0 g/dL Final  . HCT 09/05/2015 31.7* 35.0 - 47.0 % Final  . MCV 09/05/2015 86.6  80.0 - 100.0 fL Final  . MCH 09/05/2015 28.6  26.0 - 34.0 pg Final  . MCHC 09/05/2015 33.0  32.0 - 36.0 g/dL Final  . RDW 09/05/2015 14.0  11.5 - 14.5 % Final  . Platelets 09/05/2015 196  150 - 440 K/uL Final  . Neutrophils Relative % 09/05/2015 33   Final  . Neutro Abs 09/05/2015 1.7  1.4 - 6.5 K/uL Final  . Lymphocytes Relative 09/05/2015 51   Final  . Lymphs Abs 09/05/2015 2.7  1.0 - 3.6 K/uL Final  . Monocytes Relative 09/05/2015 14   Final  . Monocytes Absolute 09/05/2015 0.7  0.2 - 0.9 K/uL Final  . Eosinophils Relative 09/05/2015 1   Final  . Eosinophils Absolute 09/05/2015 0.0  0 - 0.7 K/uL Final  . Basophils Relative 09/05/2015 1   Final  . Basophils Absolute 09/05/2015 0.0  0 - 0.1 K/uL Final  . Sodium 09/05/2015 134* 135 - 145 mmol/L Final  . Potassium 09/05/2015 3.7  3.5 - 5.1 mmol/L Final  . Chloride 09/05/2015 99* 101 - 111 mmol/L Final  . CO2 09/05/2015 27  22 - 32 mmol/L Final  . Glucose, Bld 09/05/2015 127* 65 - 99 mg/dL Final  . BUN 09/05/2015 14  6 - 20 mg/dL Final  . Creatinine, Ser 09/05/2015 0.68  0.44 - 1.00 mg/dL Final  . Calcium 09/05/2015 8.1* 8.9 - 10.3 mg/dL Final  . Total Protein 09/05/2015 7.0  6.5 - 8.1 g/dL Final  . Albumin 09/05/2015 3.9  3.5 - 5.0 g/dL Final  . AST 09/05/2015 26  15 - 41 U/L Final  . ALT 09/05/2015 17  14 - 54 U/L Final  . Alkaline Phosphatase 09/05/2015 71  38 - 126 U/L Final  . Total Bilirubin 09/05/2015 0.3  0.3 - 1.2 mg/dL Final  .  GFR calc non Af Amer 09/05/2015 >60  >60 mL/min Final  . GFR calc Af Amer 09/05/2015 >60  >60 mL/min Final   Comment: (NOTE) The eGFR has been calculated using the CKD EPI equation. This calculation has not been validated in all clinical  situations. eGFR's persistently <60 mL/min signify possible Chronic Kidney Disease.   . Anion gap 09/05/2015 8  5 - 15 Final     STUDIES: No results found.  ASSESSMENT:  Cancer of ovary status post suboptimal debulking surgery because of extensive disease stage III Neoadjuvant chemotherapy with carboplatinum Taxol Abdominal wound as he will will continue Abraxane (side effects from Taxol chemotherapy) Patient would start next cycle of chemotherapy with carboplatinum, Abraxane any Avastin.      Patient expressed understanding and was in agreement with this plan. She also understands that She can call clinic at any time with any questions, concerns, or complaints.    No matching staging information was found for the patient.  Forest Gleason, MD   09/05/2015 2:35 PM

## 2015-09-12 ENCOUNTER — Inpatient Hospital Stay: Payer: Medicare Other

## 2015-09-12 DIAGNOSIS — C569 Malignant neoplasm of unspecified ovary: Secondary | ICD-10-CM

## 2015-09-12 LAB — CBC WITH DIFFERENTIAL/PLATELET
BASOS ABS: 0.1 10*3/uL (ref 0–0.1)
BASOS PCT: 1 %
Eosinophils Absolute: 0.1 10*3/uL (ref 0–0.7)
Eosinophils Relative: 1 %
HEMATOCRIT: 32.3 % — AB (ref 35.0–47.0)
HEMOGLOBIN: 10.6 g/dL — AB (ref 12.0–16.0)
LYMPHS PCT: 32 %
Lymphs Abs: 3.2 10*3/uL (ref 1.0–3.6)
MCH: 28.4 pg (ref 26.0–34.0)
MCHC: 32.8 g/dL (ref 32.0–36.0)
MCV: 86.6 fL (ref 80.0–100.0)
Monocytes Absolute: 1.2 10*3/uL — ABNORMAL HIGH (ref 0.2–0.9)
Monocytes Relative: 12 %
NEUTROS ABS: 5.5 10*3/uL (ref 1.4–6.5)
NEUTROS PCT: 54 %
Platelets: 232 10*3/uL (ref 150–440)
RBC: 3.74 MIL/uL — AB (ref 3.80–5.20)
RDW: 14.5 % (ref 11.5–14.5)
WBC: 10 10*3/uL (ref 3.6–11.0)

## 2015-09-19 ENCOUNTER — Inpatient Hospital Stay: Payer: Medicare Other

## 2015-09-19 ENCOUNTER — Encounter: Payer: Self-pay | Admitting: Oncology

## 2015-09-19 ENCOUNTER — Other Ambulatory Visit: Payer: Self-pay | Admitting: *Deleted

## 2015-09-19 ENCOUNTER — Inpatient Hospital Stay (HOSPITAL_BASED_OUTPATIENT_CLINIC_OR_DEPARTMENT_OTHER): Payer: Medicare Other | Admitting: Oncology

## 2015-09-19 VITALS — BP 135/84 | HR 93 | Resp 18

## 2015-09-19 VITALS — BP 140/73 | HR 88 | Temp 96.3°F | Wt 123.2 lb

## 2015-09-19 DIAGNOSIS — K219 Gastro-esophageal reflux disease without esophagitis: Secondary | ICD-10-CM

## 2015-09-19 DIAGNOSIS — C569 Malignant neoplasm of unspecified ovary: Secondary | ICD-10-CM

## 2015-09-19 DIAGNOSIS — E7800 Pure hypercholesterolemia, unspecified: Secondary | ICD-10-CM

## 2015-09-19 DIAGNOSIS — F419 Anxiety disorder, unspecified: Secondary | ICD-10-CM

## 2015-09-19 DIAGNOSIS — R531 Weakness: Secondary | ICD-10-CM

## 2015-09-19 DIAGNOSIS — I10 Essential (primary) hypertension: Secondary | ICD-10-CM

## 2015-09-19 DIAGNOSIS — Z79899 Other long term (current) drug therapy: Secondary | ICD-10-CM

## 2015-09-19 DIAGNOSIS — R197 Diarrhea, unspecified: Secondary | ICD-10-CM | POA: Diagnosis not present

## 2015-09-19 LAB — COMPREHENSIVE METABOLIC PANEL
ALBUMIN: 3.5 g/dL (ref 3.5–5.0)
ALT: 20 U/L (ref 14–54)
ANION GAP: 8 (ref 5–15)
AST: 32 U/L (ref 15–41)
Alkaline Phosphatase: 75 U/L (ref 38–126)
BILIRUBIN TOTAL: 0.5 mg/dL (ref 0.3–1.2)
BUN: 14 mg/dL (ref 6–20)
CO2: 26 mmol/L (ref 22–32)
Calcium: 8.5 mg/dL — ABNORMAL LOW (ref 8.9–10.3)
Chloride: 99 mmol/L — ABNORMAL LOW (ref 101–111)
Creatinine, Ser: 0.66 mg/dL (ref 0.44–1.00)
GFR calc non Af Amer: >60 mL/min (ref >60)
GLUCOSE: 115 mg/dL — AB (ref 65–99)
POTASSIUM: 4.3 mmol/L (ref 3.5–5.1)
Sodium: 133 mmol/L — ABNORMAL LOW (ref 135–145)
Total Protein: 7 g/dL (ref 6.5–8.1)

## 2015-09-19 LAB — CBC WITH DIFFERENTIAL/PLATELET
BASOS PCT: 1 %
Basophils Absolute: 0.1 10*3/uL (ref 0–0.1)
EOS ABS: 0 10*3/uL (ref 0–0.7)
Eosinophils Relative: 1 %
HEMATOCRIT: 31.8 % — AB (ref 35.0–47.0)
Hemoglobin: 10.5 g/dL — ABNORMAL LOW (ref 12.0–16.0)
Lymphocytes Relative: 38 %
Lymphs Abs: 2.6 10*3/uL (ref 1.0–3.6)
MCH: 28.7 pg (ref 26.0–34.0)
MCHC: 33.1 g/dL (ref 32.0–36.0)
MCV: 86.9 fL (ref 80.0–100.0)
MONO ABS: 1 10*3/uL — AB (ref 0.2–0.9)
MONOS PCT: 14 %
NEUTROS ABS: 3.2 10*3/uL (ref 1.4–6.5)
Neutrophils Relative %: 46 %
Platelets: 343 10*3/uL (ref 150–440)
RBC: 3.66 MIL/uL — ABNORMAL LOW (ref 3.80–5.20)
RDW: 15.7 % — AB (ref 11.5–14.5)
WBC: 6.9 10*3/uL (ref 3.6–11.0)

## 2015-09-19 MED ORDER — SODIUM CHLORIDE 0.9 % IV SOLN
Freq: Once | INTRAVENOUS | Status: AC
  Administered 2015-09-19: 11:00:00 via INTRAVENOUS
  Filled 2015-09-19: qty 1000

## 2015-09-19 MED ORDER — PACLITAXEL PROTEIN-BOUND CHEMO INJECTION 100 MG
80.0000 mg/m2 | Freq: Once | INTRAVENOUS | Status: AC
  Administered 2015-09-19: 125 mg via INTRAVENOUS
  Filled 2015-09-19: qty 25

## 2015-09-19 MED ORDER — PALONOSETRON HCL INJECTION 0.25 MG/5ML
0.2500 mg | Freq: Once | INTRAVENOUS | Status: AC
Start: 2015-09-19 — End: 2015-09-19
  Administered 2015-09-19: 0.25 mg via INTRAVENOUS
  Filled 2015-09-19: qty 5

## 2015-09-19 MED ORDER — SODIUM CHLORIDE 0.9 % IV SOLN
370.0000 mg | Freq: Once | INTRAVENOUS | Status: AC
  Administered 2015-09-19: 370 mg via INTRAVENOUS
  Filled 2015-09-19: qty 37

## 2015-09-19 MED ORDER — HEPARIN SOD (PORK) LOCK FLUSH 100 UNIT/ML IV SOLN
500.0000 [IU] | Freq: Once | INTRAVENOUS | Status: AC
  Administered 2015-09-19: 500 [IU] via INTRAVENOUS
  Filled 2015-09-19: qty 5

## 2015-09-19 MED ORDER — SODIUM CHLORIDE 0.9 % IV SOLN
800.0000 mg | Freq: Once | INTRAVENOUS | Status: AC
  Administered 2015-09-19: 800 mg via INTRAVENOUS
  Filled 2015-09-19: qty 32

## 2015-09-19 MED ORDER — SODIUM CHLORIDE 0.9 % IJ SOLN
10.0000 mL | INTRAMUSCULAR | Status: DC | PRN
  Administered 2015-09-19: 10 mL via INTRAVENOUS
  Filled 2015-09-19: qty 10

## 2015-09-19 MED ORDER — SODIUM CHLORIDE 0.9 % IV SOLN
Freq: Once | INTRAVENOUS | Status: AC
  Administered 2015-09-19: 11:00:00 via INTRAVENOUS
  Filled 2015-09-19: qty 5

## 2015-09-20 ENCOUNTER — Encounter: Payer: Self-pay | Admitting: Oncology

## 2015-09-20 NOTE — Progress Notes (Signed)
[place] @ [facility] Telephone:(336) [phone]  Fax:(336) [place] OB: 02/06/1929  MR#: [id number]  ATF#:[id number]  Patient Care Team: Albina Billet, MD as PCP - General (Internal Medicine) [facility] Gaetana Michaelis, MD as Referring Physician (Obstetrics and Gynecology) Maceo Pro, MD as Referring Physician (Obstetrics and Gynecology)  CHIEF COMPLAINT: (oncology history)  Carcinoma of ovary status post exploratory laparotomy and biopsy Extensive disease stage IIIc T IIIcNX M0 tumor Suboptimal debulking (September, 2016) . BRCA mutation was negative .  Patient is starting new cycle of chemotherapy Tolerating treatment very well.  No chills.  No fever.  Abdominal wound is healing.  No nausea or vomiting or diarrhea.  Here for further evaluation and continuation of chemotherapy.  The Avastin is going to be added.  Patient and family had number of questions regarding Avastin VISIT DIAGNOSIS:  Ovarian cancer   ICD-9-CM ICD-10-CM   1. Ovarian cancer, unspecified laterality (HCC) 183.0 C56.9 Comprehensive metabolic panel     Magnesium     CA 125      No history exists.    Oncology Flowsheet 07/23/2015 07/24/2015 08/15/2015 08/22/2015 08/29/2015 09/05/2015 09/19/2015  Day, Cycle - - Day 1, 1 Day 1, Cycle 1 Day 8, Cycle 1 Day 15, Cycle 1 Day 1, Cycle 2  bevacizumab (AVASTIN) IV - - - - - - 800 mg  CARBOplatin (PARAPLATIN) IV - - - 370 mg - - 370 mg  dexamethasone (DECADRON) IJ - - - - - - -  dexamethasone (DECADRON) IV - - [ 12 mg ] [ 12 mg ] [ 20 mg ] [ 20 mg ] [ 12 mg ]  enoxaparin (LOVENOX) [place]   - - - - - -  fosaprepitant (EMEND) IV - - [ 150 mg ] [ 150 mg ] - - [ 150 mg ]  LORazepam (ATIVAN) PO 0.5 mg 0.5 mg - - - - -  ondansetron (ZOFRAN) IV - - [ 4 mg ] - [ 8 mg ] [ 8 mg ] -  PACLitaxel (TAXOL) IV - - 80 mg/m2 - - - -  PACLitaxel-protein bound (ABRAXANE) IV - - - 80 mg/m2 80 mg/m2 80 mg/m2 80 mg/m2  palonosetron (ALOXI) IV - - 0.25 mg 0.25 mg -  - 0.25 mg    Review of system  general status: Patient is feeling weak and tired.  No change in a performance status.  No chills.  No fever. HEENT:  No evidence of stomatitis Lungs: No cough or shortness of breath Cardiac: No chest pain or paroxysmal nocturnal dyspnea GI: No nausea no vomiting no diarrhea no abdominal pain Abdominal wound is healing well. Skin: No rash Lower extremity no swelling Neurological system: No tingling.  No numbness.  No other focal signs Musculoskeletal system no bony pains  PAST MEDICAL HISTORY: Past Medical History  Diagnosis Date  . Glaucoma   . GERD (gastroesophageal reflux disease)   . Hypercholesteremia   . PONV (postoperative nausea and vomiting)   . Hypertension   . Anxiety   . Cancer [facility])     PAST SURGICAL HISTORY: Past Surgical History  Procedure Laterality Date  . Abdominal hysterectomy    . Replacement total knee      Right  . Breast biopsy    . Hand surgery      Right  . Laparotomy Left 07/20/2015    Procedure: LAPAROTOMY with Left Salpingooophrectomy;  Surgeon: Honor Loh Ward, MD;  Location: [facility] ORS;  Service: Gynecology;  Laterality: Left;  . Laparotomy N/A 07/20/2015    Procedure: LAPAROTOMY;  Surgeon: Gillis Ends, MD;  Location: [facility] ORS;  Service: Gynecology;  Laterality: N/A;  . Salpingoophorectomy Right 07/20/2015    Procedure: SALPINGO OOPHORECTOMY;  Surgeon: Gillis Ends, MD;  Location: [facility] ORS;  Service: Gynecology;  Laterality: Right;  . Peripheral vascular catheterization N/A 08/10/2015    Procedure: Glori Luis Cath Insertion;  Surgeon: Algernon Huxley, MD;  Location: [place] CV LAB;  Service: Cardiovascular;  Laterality: N/A;    FAMILY HISTORY No family history on file. There is no significant family history of breast cancer, ovarian cancer, colon cancer       ADVANCED DIRECTIVES:  Patient does have advance healthcare directive, Patient   does not desire to make any changes  HEALTH  MAINTENANCE: Social History  Substance Use Topics  . Smoking status: Never Smoker   . Smokeless tobacco: Never Used  . Alcohol Use: No      Allergies  Allergen Reactions  . Clindamycin Hcl Diarrhea    antibiotic colitis  . Clindamycin/Lincomycin Diarrhea  . Codeine Anxiety    Current Outpatient Prescriptions  Medication Sig Dispense Refill  . acetaminophen (TYLENOL) 500 MG tablet Take 500 mg by mouth every 6 (six) hours as needed.     . Ascorbic Acid (VITAMIN C CR) 500 MG CPCR Take 1 capsule by mouth daily.     Marland Kitchen bismuth subsalicylate (PEPTO BISMOL) 262 MG chewable tablet Chew by mouth as needed.     Marland Kitchen dexamethasone (DECADRON) 4 MG tablet Take 1 tab by mouth twice a day the day before chemotherapy. 20 tablet 0  . enoxaparin (LOVENOX) 40 MG/0.4ML injection Inject 0.4 mLs (40 mg total) into the skin daily. 30 Syringe 2  . fluticasone (FLONASE) 50 MCG/ACT nasal spray Place 2 sprays into both nostrils daily as needed.     Marland Kitchen ibuprofen (ADVIL,MOTRIN) 600 MG tablet Take 1 tablet (600 mg total) by mouth every 6 (six) hours. 30 tablet 0  . lidocaine-prilocaine (EMLA) cream Apply 1 application topically as needed. 30 g 3  . LORazepam (ATIVAN) 0.5 MG tablet Take by mouth 2 (two) times daily.     Marland Kitchen LUMIGAN 0.01 % SOLN Place 1 drop into both eyes at bedtime.     . meloxicam (MOBIC) 15 MG tablet Take 15 mg by mouth daily.     Marland Kitchen menthol-cetylpyridinium (CEPACOL) 3 MG lozenge Take 1 lozenge (3 mg total) by mouth every 2 (two) hours as needed for sore throat. 100 tablet 12  . omeprazole (PRILOSEC) 20 MG capsule Take 20 mg by mouth daily.     . ondansetron (ZOFRAN) 4 MG tablet Take 1 tablet (4 mg total) by mouth every 6 (six) hours as needed for nausea. 30 tablet 3  . oxyCODONE (OXY IR/ROXICODONE) 5 MG immediate release tablet Take 1 tablet (5 mg total) by mouth every 4 (four) hours as needed for moderate pain. 30 tablet 0  . simvastatin (ZOCOR) 20 MG tablet Take 20 mg by mouth at bedtime.     .  traMADol (ULTRAM) 50 MG tablet Take 1 tablet (50 mg total) by mouth every 6 (six) hours. 65 tablet 0   No current facility-administered medications for this visit.   Facility-Administered Medications Ordered in Other Visits  Medication Dose Route Frequency Provider Last Rate Last Dose  . diphenhydrAMINE (BENADRYL) injection 12.5 mg  12.5 mg Intravenous Once Forest Gleason, MD      . methylPREDNISolone sodium succinate (  SOLU-MEDROL) 125 mg/2 mL injection 100 mg  100 mg Intravenous Once Forest Gleason, MD        OBJECTIVE: PHYSICAL EXAM: Gen. status: Patient is alert oriented not any acute distress.. Lymphatic system: Supraclavicular, cervical, axillary, inguinal lymph nodes are not palpable Cardiac exam revealed the PMI to be normally situated and sized. The rhythm was regular and no extrasystoles were noted during several minutes of auscultation. The first and second heart sounds were normal and physiologic splitting of the second heart sound was noted. There were no murmurs, rubs, clicks, or gallops. Examination of the chest was unremarkable. There were no bony deformities, no asymmetry, and no other abnormalities. Abdomen: Soft no ascites no palpable masses.  Abdominal wound has healed. Examination of the skin revealed no evidence of significant rashes, suspicious appearing nevi or other concerning lesions. Neurologically, the patient was awake, alert, and oriented to person, place and time. There were no obvious focal neurologic abnormalities.  Lower extremity no swelling Musculoskeletal system within normal limit   Filed Vitals:   09/19/15 0927  BP: 140/73  Pulse: 88  Temp: 96.3 F (35.7 C)     Body mass index is 24.88 kg/(m^2).    ECOG FS:1 - Symptomatic but completely ambulatory  LAB RESULTS:  Infusion on 09/19/2015  Component Date Value Ref Range Status  . WBC 09/19/2015 6.9  3.6 - 11.0 K/uL Final   A-LINE DRAW  . RBC 09/19/2015 3.66* 3.80 - 5.20 MIL/uL Final  . Hemoglobin  09/19/2015 10.5* 12.0 - 16.0 g/dL Final  . HCT 09/19/2015 31.8* 35.0 - 47.0 % Final  . MCV 09/19/2015 86.9  80.0 - 100.0 fL Final  . MCH 09/19/2015 28.7  26.0 - 34.0 pg Final  . MCHC 09/19/2015 33.1  32.0 - 36.0 g/dL Final  . RDW 09/19/2015 15.7* 11.5 - 14.5 % Final  . Platelets 09/19/2015 343  150 - 440 K/uL Final  . Neutrophils Relative % 09/19/2015 46   Final  . Neutro Abs 09/19/2015 3.2  1.4 - 6.5 K/uL Final  . Lymphocytes Relative 09/19/2015 38   Final  . Lymphs Abs 09/19/2015 2.6  1.0 - 3.6 K/uL Final  . Monocytes Relative 09/19/2015 14   Final  . Monocytes Absolute 09/19/2015 1.0* 0.2 - 0.9 K/uL Final  . Eosinophils Relative 09/19/2015 1   Final  . Eosinophils Absolute 09/19/2015 0.0  0 - 0.7 K/uL Final  . Basophils Relative 09/19/2015 1   Final  . Basophils Absolute 09/19/2015 0.1  0 - 0.1 K/uL Final  . Sodium 09/19/2015 133* 135 - 145 mmol/L Final  . Potassium 09/19/2015 4.3  3.5 - 5.1 mmol/L Final  . Chloride 09/19/2015 99* 101 - 111 mmol/L Final  . CO2 09/19/2015 26  22 - 32 mmol/L Final  . Glucose, Bld 09/19/2015 115* 65 - 99 mg/dL Final  . BUN 09/19/2015 14  6 - 20 mg/dL Final  . Creatinine, Ser 09/19/2015 0.66  0.44 - 1.00 mg/dL Final  . Calcium 09/19/2015 8.5* 8.9 - 10.3 mg/dL Final  . Total Protein 09/19/2015 7.0  6.5 - 8.1 g/dL Final  . Albumin 09/19/2015 3.5  3.5 - 5.0 g/dL Final  . AST 09/19/2015 32  15 - 41 U/L Final  . ALT 09/19/2015 20  14 - 54 U/L Final  . Alkaline Phosphatase 09/19/2015 75  38 - 126 U/L Final  . Total Bilirubin 09/19/2015 0.5  0.3 - 1.2 mg/dL Final  . GFR calc non Af Amer 09/19/2015 >60  >60 mL/min Final  .  GFR calc Af Amer 09/19/2015 >60  >60 mL/min Final   Comment: (NOTE) The eGFR has been calculated using the CKD EPI equation. This calculation has not been validated in all clinical situations. eGFR's persistently <60 mL/min signify possible Chronic Kidney Disease.   . Anion gap 09/19/2015 8  5 - 15 Final      ASSESSMENT:  Cancer  of ovary status post suboptimal debulking surgery because of extensive disease stage III Neoadjuvant chemotherapy with carboplatinum Taxol Abdominal wound as he will will continue Abraxane (side effects from Taxol chemotherapy) Will start patient second cycle of chemotherapy with carboplatinum Abraxane and will   add  Avastin. All the side effects of Avastin including slightly elevated blood pressure proteinuria and on rare occasion nephrotic syndrome has been discussed with the patient. Total duration of visit was    30   minutes.  50% or more time was spent in counseling patient and family regarding prognosis and options of treatment and available resources ,     Patient expressed understanding and was in agreement with this plan. She also understands that She can call clinic at any time with any questions, concerns, or complaints.    No matching staging information was found for the patient.  Forest Gleason, MD   09/20/2015 8:31 AM

## 2015-09-26 ENCOUNTER — Inpatient Hospital Stay: Payer: Medicare Other | Attending: Oncology

## 2015-09-26 ENCOUNTER — Inpatient Hospital Stay: Payer: Medicare Other

## 2015-09-26 VITALS — BP 119/80 | HR 91 | Temp 96.1°F | Resp 18

## 2015-09-26 DIAGNOSIS — R5383 Other fatigue: Secondary | ICD-10-CM | POA: Insufficient documentation

## 2015-09-26 DIAGNOSIS — R531 Weakness: Secondary | ICD-10-CM | POA: Insufficient documentation

## 2015-09-26 DIAGNOSIS — F419 Anxiety disorder, unspecified: Secondary | ICD-10-CM | POA: Insufficient documentation

## 2015-09-26 DIAGNOSIS — I10 Essential (primary) hypertension: Secondary | ICD-10-CM | POA: Insufficient documentation

## 2015-09-26 DIAGNOSIS — C569 Malignant neoplasm of unspecified ovary: Secondary | ICD-10-CM | POA: Insufficient documentation

## 2015-09-26 DIAGNOSIS — Z5111 Encounter for antineoplastic chemotherapy: Secondary | ICD-10-CM | POA: Diagnosis not present

## 2015-09-26 DIAGNOSIS — E7800 Pure hypercholesterolemia, unspecified: Secondary | ICD-10-CM | POA: Insufficient documentation

## 2015-09-26 DIAGNOSIS — K219 Gastro-esophageal reflux disease without esophagitis: Secondary | ICD-10-CM | POA: Diagnosis not present

## 2015-09-26 LAB — COMPREHENSIVE METABOLIC PANEL
ALT: 20 U/L (ref 14–54)
ANION GAP: 7 (ref 5–15)
AST: 27 U/L (ref 15–41)
Albumin: 3.7 g/dL (ref 3.5–5.0)
Alkaline Phosphatase: 66 U/L (ref 38–126)
BILIRUBIN TOTAL: 0.6 mg/dL (ref 0.3–1.2)
BUN: 17 mg/dL (ref 6–20)
CHLORIDE: 96 mmol/L — AB (ref 101–111)
CO2: 27 mmol/L (ref 22–32)
Calcium: 9 mg/dL (ref 8.9–10.3)
Creatinine, Ser: 0.62 mg/dL (ref 0.44–1.00)
GFR calc non Af Amer: >60 mL/min (ref >60)
Glucose, Bld: 108 mg/dL — ABNORMAL HIGH (ref 65–99)
POTASSIUM: 4.4 mmol/L (ref 3.5–5.1)
Sodium: 130 mmol/L — ABNORMAL LOW (ref 135–145)
TOTAL PROTEIN: 7.1 g/dL (ref 6.5–8.1)

## 2015-09-26 LAB — CBC WITH DIFFERENTIAL/PLATELET
BASOS ABS: 0 10*3/uL (ref 0–0.1)
Basophils Relative: 0 %
EOS PCT: 1 %
Eosinophils Absolute: 0 10*3/uL (ref 0–0.7)
HCT: 31.6 % — ABNORMAL LOW (ref 35.0–47.0)
Hemoglobin: 10.6 g/dL — ABNORMAL LOW (ref 12.0–16.0)
LYMPHS PCT: 39 %
Lymphs Abs: 2.7 10*3/uL (ref 1.0–3.6)
MCH: 28.9 pg (ref 26.0–34.0)
MCHC: 33.6 g/dL (ref 32.0–36.0)
MCV: 86 fL (ref 80.0–100.0)
MONO ABS: 0.4 10*3/uL (ref 0.2–0.9)
MONOS PCT: 6 %
Neutro Abs: 3.8 10*3/uL (ref 1.4–6.5)
Neutrophils Relative %: 54 %
PLATELETS: 284 10*3/uL (ref 150–440)
RBC: 3.67 MIL/uL — ABNORMAL LOW (ref 3.80–5.20)
RDW: 15.9 % — AB (ref 11.5–14.5)
WBC: 7 10*3/uL (ref 3.6–11.0)

## 2015-09-26 MED ORDER — HEPARIN SOD (PORK) LOCK FLUSH 100 UNIT/ML IV SOLN
500.0000 [IU] | Freq: Once | INTRAVENOUS | Status: AC
  Administered 2015-09-26: 500 [IU] via INTRAVENOUS
  Filled 2015-09-26: qty 5

## 2015-09-26 MED ORDER — SODIUM CHLORIDE 0.9 % IJ SOLN
10.0000 mL | INTRAMUSCULAR | Status: DC | PRN
  Filled 2015-09-26: qty 10

## 2015-09-26 MED ORDER — SODIUM CHLORIDE 0.9 % IV SOLN
Freq: Once | INTRAVENOUS | Status: AC
  Administered 2015-09-26: 11:00:00 via INTRAVENOUS
  Filled 2015-09-26: qty 4

## 2015-09-26 MED ORDER — SODIUM CHLORIDE 0.9 % IV SOLN
Freq: Once | INTRAVENOUS | Status: AC
  Administered 2015-09-26: 10:00:00 via INTRAVENOUS
  Filled 2015-09-26: qty 1000

## 2015-09-26 MED ORDER — PACLITAXEL PROTEIN-BOUND CHEMO INJECTION 100 MG
80.0000 mg/m2 | Freq: Once | INTRAVENOUS | Status: AC
  Administered 2015-09-26: 125 mg via INTRAVENOUS
  Filled 2015-09-26: qty 25

## 2015-10-03 ENCOUNTER — Inpatient Hospital Stay: Payer: Medicare Other

## 2015-10-03 VITALS — BP 114/80 | HR 83 | Temp 98.5°F | Resp 20

## 2015-10-03 DIAGNOSIS — C569 Malignant neoplasm of unspecified ovary: Secondary | ICD-10-CM

## 2015-10-03 LAB — CBC WITH DIFFERENTIAL/PLATELET
BASOS ABS: 0 10*3/uL (ref 0–0.1)
BASOS PCT: 0 %
EOS ABS: 0 10*3/uL (ref 0–0.7)
Eosinophils Relative: 0 %
HEMATOCRIT: 28.9 % — AB (ref 35.0–47.0)
HEMOGLOBIN: 9.6 g/dL — AB (ref 12.0–16.0)
Lymphocytes Relative: 40 %
Lymphs Abs: 2.5 10*3/uL (ref 1.0–3.6)
MCH: 28.7 pg (ref 26.0–34.0)
MCHC: 33.2 g/dL (ref 32.0–36.0)
MCV: 86.5 fL (ref 80.0–100.0)
MONO ABS: 1.2 10*3/uL — AB (ref 0.2–0.9)
Monocytes Relative: 19 %
NEUTROS ABS: 2.6 10*3/uL (ref 1.4–6.5)
NEUTROS PCT: 41 %
Platelets: 156 10*3/uL (ref 150–440)
RBC: 3.34 MIL/uL — ABNORMAL LOW (ref 3.80–5.20)
RDW: 15.7 % — AB (ref 11.5–14.5)
WBC: 6.4 10*3/uL (ref 3.6–11.0)

## 2015-10-03 MED ORDER — HEPARIN SOD (PORK) LOCK FLUSH 100 UNIT/ML IV SOLN
500.0000 [IU] | Freq: Once | INTRAVENOUS | Status: AC
  Administered 2015-10-03: 500 [IU] via INTRAVENOUS
  Filled 2015-10-03: qty 5

## 2015-10-03 MED ORDER — SODIUM CHLORIDE 0.9 % IV SOLN
Freq: Once | INTRAVENOUS | Status: AC
  Administered 2015-10-03: 11:00:00 via INTRAVENOUS
  Filled 2015-10-03: qty 4

## 2015-10-03 MED ORDER — SODIUM CHLORIDE 0.9 % IV SOLN
Freq: Once | INTRAVENOUS | Status: AC
  Administered 2015-10-03: 10:00:00 via INTRAVENOUS
  Filled 2015-10-03: qty 1000

## 2015-10-03 MED ORDER — SODIUM CHLORIDE 0.9 % IJ SOLN
10.0000 mL | Freq: Once | INTRAMUSCULAR | Status: AC
  Administered 2015-10-03: 10 mL via INTRAVENOUS
  Filled 2015-10-03: qty 10

## 2015-10-03 MED ORDER — PACLITAXEL PROTEIN-BOUND CHEMO INJECTION 100 MG
80.0000 mg/m2 | Freq: Once | INTRAVENOUS | Status: AC
  Administered 2015-10-03: 125 mg via INTRAVENOUS
  Filled 2015-10-03: qty 25

## 2015-10-04 LAB — CA 125: CA 125: 10.2 U/mL (ref 0.0–38.1)

## 2015-10-06 ENCOUNTER — Telehealth: Payer: Self-pay | Admitting: *Deleted

## 2015-10-06 DIAGNOSIS — C569 Malignant neoplasm of unspecified ovary: Secondary | ICD-10-CM

## 2015-10-06 MED ORDER — LIDOCAINE-PRILOCAINE 2.5-2.5 % EX CREA: 1.0000 "application " | TOPICAL_CREAM | CUTANEOUS | Status: DC | PRN

## 2015-10-06 NOTE — Telephone Encounter (Signed)
Escribed

## 2015-10-17 ENCOUNTER — Inpatient Hospital Stay: Payer: Medicare Other

## 2015-10-17 ENCOUNTER — Inpatient Hospital Stay (HOSPITAL_BASED_OUTPATIENT_CLINIC_OR_DEPARTMENT_OTHER): Payer: Medicare Other | Admitting: Oncology

## 2015-10-17 ENCOUNTER — Encounter: Payer: Self-pay | Admitting: Oncology

## 2015-10-17 VITALS — BP 134/80 | HR 102 | Temp 97.2°F | Wt 122.4 lb

## 2015-10-17 VITALS — BP 129/86 | HR 91 | Resp 20

## 2015-10-17 DIAGNOSIS — R5383 Other fatigue: Secondary | ICD-10-CM

## 2015-10-17 DIAGNOSIS — F419 Anxiety disorder, unspecified: Secondary | ICD-10-CM

## 2015-10-17 DIAGNOSIS — E7800 Pure hypercholesterolemia, unspecified: Secondary | ICD-10-CM

## 2015-10-17 DIAGNOSIS — C569 Malignant neoplasm of unspecified ovary: Secondary | ICD-10-CM | POA: Diagnosis not present

## 2015-10-17 DIAGNOSIS — K219 Gastro-esophageal reflux disease without esophagitis: Secondary | ICD-10-CM

## 2015-10-17 DIAGNOSIS — R531 Weakness: Secondary | ICD-10-CM | POA: Diagnosis not present

## 2015-10-17 DIAGNOSIS — I10 Essential (primary) hypertension: Secondary | ICD-10-CM

## 2015-10-17 LAB — CBC WITH DIFFERENTIAL/PLATELET
Basophils Absolute: 0 10*3/uL (ref 0–0.1)
Basophils Relative: 1 %
Eosinophils Absolute: 0 10*3/uL (ref 0–0.7)
Eosinophils Relative: 0 %
HEMATOCRIT: 32.3 % — AB (ref 35.0–47.0)
Hemoglobin: 10.7 g/dL — ABNORMAL LOW (ref 12.0–16.0)
LYMPHS ABS: 3.4 10*3/uL (ref 1.0–3.6)
LYMPHS PCT: 45 %
MCH: 29.3 pg (ref 26.0–34.0)
MCHC: 33.1 g/dL (ref 32.0–36.0)
MCV: 88.5 fL (ref 80.0–100.0)
MONO ABS: 1 10*3/uL — AB (ref 0.2–0.9)
MONOS PCT: 13 %
NEUTROS ABS: 3.1 10*3/uL (ref 1.4–6.5)
Neutrophils Relative %: 41 %
Platelets: 426 10*3/uL (ref 150–440)
RBC: 3.65 MIL/uL — ABNORMAL LOW (ref 3.80–5.20)
RDW: 19.8 % — AB (ref 11.5–14.5)
WBC: 7.6 10*3/uL (ref 3.6–11.0)

## 2015-10-17 LAB — COMPREHENSIVE METABOLIC PANEL
ALK PHOS: 88 U/L (ref 38–126)
ALT: 25 U/L (ref 14–54)
ANION GAP: 7 (ref 5–15)
AST: 29 U/L (ref 15–41)
Albumin: 3.7 g/dL (ref 3.5–5.0)
BILIRUBIN TOTAL: 0.5 mg/dL (ref 0.3–1.2)
BUN: 16 mg/dL (ref 6–20)
CO2: 26 mmol/L (ref 22–32)
CREATININE: 0.76 mg/dL (ref 0.44–1.00)
Calcium: 9 mg/dL (ref 8.9–10.3)
Chloride: 99 mmol/L — ABNORMAL LOW (ref 101–111)
GFR calc Af Amer: >60 mL/min (ref >60)
Glucose, Bld: 113 mg/dL — ABNORMAL HIGH (ref 65–99)
POTASSIUM: 4.5 mmol/L (ref 3.5–5.1)
Sodium: 132 mmol/L — ABNORMAL LOW (ref 135–145)
Total Protein: 7.5 g/dL (ref 6.5–8.1)

## 2015-10-17 LAB — MAGNESIUM: Magnesium: 2 mg/dL (ref 1.7–2.4)

## 2015-10-17 MED ORDER — BEVACIZUMAB CHEMO INJECTION 400 MG/16ML
800.0000 mg | Freq: Once | INTRAVENOUS | Status: AC
  Administered 2015-10-17: 800 mg via INTRAVENOUS
  Filled 2015-10-17: qty 32

## 2015-10-17 MED ORDER — HEPARIN SOD (PORK) LOCK FLUSH 100 UNIT/ML IV SOLN
500.0000 [IU] | Freq: Once | INTRAVENOUS | Status: AC | PRN
  Administered 2015-10-17: 500 [IU]
  Filled 2015-10-17: qty 5

## 2015-10-17 MED ORDER — PALONOSETRON HCL INJECTION 0.25 MG/5ML
0.2500 mg | Freq: Once | INTRAVENOUS | Status: AC
  Administered 2015-10-17: 0.25 mg via INTRAVENOUS
  Filled 2015-10-17: qty 5

## 2015-10-17 MED ORDER — FOSAPREPITANT DIMEGLUMINE INJECTION 150 MG
Freq: Once | INTRAVENOUS | Status: AC
  Administered 2015-10-17: 10:00:00 via INTRAVENOUS
  Filled 2015-10-17: qty 5

## 2015-10-17 MED ORDER — PACLITAXEL PROTEIN-BOUND CHEMO INJECTION 100 MG
80.0000 mg/m2 | Freq: Once | INTRAVENOUS | Status: AC
  Administered 2015-10-17: 125 mg via INTRAVENOUS
  Filled 2015-10-17: qty 25

## 2015-10-17 MED ORDER — SODIUM CHLORIDE 0.9 % IV SOLN
370.0000 mg | Freq: Once | INTRAVENOUS | Status: AC
  Administered 2015-10-17: 370 mg via INTRAVENOUS
  Filled 2015-10-17: qty 37

## 2015-10-17 MED ORDER — SODIUM CHLORIDE 0.9 % IV SOLN
Freq: Once | INTRAVENOUS | Status: AC
  Administered 2015-10-17: 10:00:00 via INTRAVENOUS
  Filled 2015-10-17: qty 1000

## 2015-10-17 MED ORDER — SODIUM CHLORIDE 0.9 % IJ SOLN
10.0000 mL | Freq: Once | INTRAMUSCULAR | Status: AC
  Administered 2015-10-17: 10 mL via INTRAVENOUS
  Filled 2015-10-17: qty 10

## 2015-10-17 NOTE — Progress Notes (Signed)
Patient just finished Z-Pak yesterday for cold/congestion.

## 2015-10-17 NOTE — Progress Notes (Signed)
[place] @ [facility] [phone]) [phone]  Fax:(336) [place]: December 08, 1928  MR#: [id number]  EKC#:[id number]  Patient Care Team: Albina Billet, MD as PCP - General (Internal Medicine) [facility] Gaetana Michaelis, MD as Referring Physician (Obstetrics and Gynecology) Maceo Pro, MD as Referring Physician (Obstetrics and Gynecology)  CHIEF COMPLAINT: (oncology history)  Carcinoma of ovary status post exploratory laparotomy and biopsy Extensive disease stage IIIc T IIIcNX M0 tumor Suboptimal debulking (September, 2016) . BRCA mutation was negative . Patient is here for ongoing evaluation and continuation of third cycle of chemotherapy. Patient is tolerating treatment very well except that feeling somewhat weak and sleeping most of time and resting.  No tingling.  No numbness.  No abdominal pain. VISIT DIAGNOSIS:  Ovarian cancer No diagnosis found.    No history exists.    Oncology Flowsheet 08/15/2015 08/22/2015 08/29/2015 09/05/2015 09/19/2015 09/26/2015 10/03/2015  Day, Cycle Day 1, 1 Day 1, Cycle 1 Day 8, Cycle 1 Day 15, Cycle 1 Day 1, Cycle 2 Day 8, Cycle 2 Day 15, Cycle 2  bevacizumab (AVASTIN) IV - - - - 800 mg - -  CARBOplatin (PARAPLATIN) IV - 370 mg - - 370 mg - -  dexamethasone (DECADRON) IJ - - - - - - -  dexamethasone (DECADRON) IV [ 12 mg ] [ 12 mg ] [ 20 mg ] [ 20 mg ] [ 12 mg ] [ 20 mg ] [ 20 mg ]  enoxaparin (LOVENOX) [place] - - - - - - -  fosaprepitant (EMEND) IV [ 150 mg ] [ 150 mg ] - - [ 150 mg ] - -  LORazepam (ATIVAN) PO - - - - - - -  ondansetron (ZOFRAN) IV [ 4 mg ] - [ 8 mg ] [ 8 mg ] - [ 8 mg ] [ 8 mg ]  PACLitaxel (TAXOL) IV 80 mg/m2 - - - - - -  PACLitaxel-protein bound (ABRAXANE) IV - 80 mg/m2 80 mg/m2 80 mg/m2 80 mg/m2 80 mg/m2 80 mg/m2  palonosetron (ALOXI) IV 0.25 mg 0.25 mg - - 0.25 mg - -    Review of system  general status: Patient is feeling weak and tired.  No change in a performance status.  No chills.  No  fever. HEENT:  No evidence of stomatitis Lungs: No cough or shortness of breath Cardiac: No chest pain or paroxysmal nocturnal dyspnea GI: No nausea no vomiting no diarrhea no abdominal pain Abdominal wound is healing well. Skin: No rash Lower extremity no swelling Neurological system: No tingling.  No numbness.  No other focal signs Musculoskeletal system no bony pains  PAST MEDICAL HISTORY: Past Medical History  Diagnosis Date  . Glaucoma   . GERD (gastroesophageal reflux disease)   . Hypercholesteremia   . PONV (postoperative nausea and vomiting)   . Hypertension   . Anxiety   . Cancer [facility])     PAST SURGICAL HISTORY: Past Surgical History  Procedure Laterality Date  . Abdominal hysterectomy    . Replacement total knee      Right  . Breast biopsy    . Hand surgery      Right  . Laparotomy Left 07/20/2015    Procedure: LAPAROTOMY with Left Salpingooophrectomy;  Surgeon: Honor Loh Ward, MD;  Location: [facility] ORS;  Service: Gynecology;  Laterality: Left;  . Laparotomy N/A 07/20/2015    Procedure: LAPAROTOMY;  Surgeon: Gillis Ends, MD;  Location: [facility] ORS;  Service: Gynecology;  Laterality: N/A;  . Salpingoophorectomy Right 07/20/2015    Procedure: SALPINGO OOPHORECTOMY;  Surgeon: Gillis Ends, MD;  Location: [facility] ORS;  Service: Gynecology;  Laterality: Right;  . Peripheral vascular catheterization N/A 08/10/2015    Procedure: Glori Luis Cath Insertion;  Surgeon: Algernon Huxley, MD;  Location: [place] CV LAB;  Service: Cardiovascular;  Laterality: N/A;    FAMILY HISTORY No family history on file. There is no significant family history of breast cancer, ovarian cancer, colon cancer       ADVANCED DIRECTIVES:  Patient does have advance healthcare directive, Patient   does not desire to make any changes  HEALTH MAINTENANCE: Social History  Substance Use Topics  . Smoking status: Never Smoker   . Smokeless tobacco: Never Used  . Alcohol Use: No       Allergies  Allergen Reactions  . Clindamycin Hcl Diarrhea    antibiotic colitis  . Clindamycin/Lincomycin Diarrhea  . Codeine Anxiety    Current Outpatient Prescriptions  Medication Sig Dispense Refill  . acetaminophen (TYLENOL) 500 MG tablet Take 500 mg by mouth every 6 (six) hours as needed.     . Ascorbic Acid (VITAMIN C CR) 500 MG CPCR Take 1 capsule by mouth daily.     Marland Kitchen bismuth subsalicylate (PEPTO BISMOL) 262 MG chewable tablet Chew by mouth as needed.     Marland Kitchen dexamethasone (DECADRON) 4 MG tablet Take 1 tab by mouth twice a day the day before chemotherapy. 20 tablet 0  . enoxaparin (LOVENOX) 40 MG/0.4ML injection Inject 0.4 mLs (40 mg total) into the skin daily. 30 Syringe 2  . fluticasone (FLONASE) 50 MCG/ACT nasal spray Place 2 sprays into both nostrils daily as needed.     Marland Kitchen ibuprofen (ADVIL,MOTRIN) 600 MG tablet Take 1 tablet (600 mg total) by mouth every 6 (six) hours. 30 tablet 0  . lidocaine-prilocaine (EMLA) cream Apply 1 application topically as needed. 30 g 3  . LORazepam (ATIVAN) 0.5 MG tablet Take by mouth 2 (two) times daily.     Marland Kitchen LUMIGAN 0.01 % SOLN Place 1 drop into both eyes at bedtime.     . meloxicam (MOBIC) 15 MG tablet Take 15 mg by mouth daily.     Marland Kitchen menthol-cetylpyridinium (CEPACOL) 3 MG lozenge Take 1 lozenge (3 mg total) by mouth every 2 (two) hours as needed for sore throat. 100 tablet 12  . omeprazole (PRILOSEC) 20 MG capsule Take 20 mg by mouth daily.     . ondansetron (ZOFRAN) 4 MG tablet Take 1 tablet (4 mg total) by mouth every 6 (six) hours as needed for nausea. 30 tablet 3  . oxyCODONE (OXY IR/ROXICODONE) 5 MG immediate release tablet Take 1 tablet (5 mg total) by mouth every 4 (four) hours as needed for moderate pain. 30 tablet 0  . simvastatin (ZOCOR) 20 MG tablet Take 20 mg by mouth at bedtime.     . traMADol (ULTRAM) 50 MG tablet Take 1 tablet (50 mg total) by mouth every 6 (six) hours. 65 tablet 0   No current facility-administered  medications for this visit.   Facility-Administered Medications Ordered in Other Visits  Medication Dose Route Frequency Provider Last Rate Last Dose  . diphenhydrAMINE (BENADRYL) injection 12.5 mg  12.5 mg Intravenous Once Forest Gleason, MD      . methylPREDNISolone sodium succinate (SOLU-MEDROL) 125 mg/2 mL injection 100 mg  100 mg Intravenous Once Forest Gleason, MD        OBJECTIVE: PHYSICAL EXAM: Gen.  status: Patient is alert oriented not any acute distress.. Lymphatic system: Supraclavicular, cervical, axillary, inguinal lymph nodes are not palpable Cardiac exam revealed the PMI to be normally situated and sized. The rhythm was regular and no extrasystoles were noted during several minutes of auscultation. The first and second heart sounds were normal and physiologic splitting of the second heart sound was noted. There were no murmurs, rubs, clicks, or gallops. Examination of the chest was unremarkable. There were no bony deformities, no asymmetry, and no other abnormalities. Abdomen: Soft no ascites no palpable masses.  Abdominal wound has healed. Examination of the skin revealed no evidence of significant rashes, suspicious appearing nevi or other concerning lesions. Neurologically, the patient was awake, alert, and oriented to person, place and time. There were no obvious focal neurologic abnormalities.  Lower extremity no swelling Musculoskeletal system within normal limit   Filed Vitals:   10/17/15 0902  BP: 134/80  Pulse: 102  Temp: 97.2 F (36.2 C)     Body mass index is 24.7 kg/(m^2).    ECOG FS:1 - Symptomatic but completely ambulatory  LAB RESULTS:  Infusion on 10/17/2015  Component Date Value Ref Range Status  . WBC 10/17/2015 7.6  3.6 - 11.0 K/uL Final  . RBC 10/17/2015 3.65* 3.80 - 5.20 MIL/uL Final  . Hemoglobin 10/17/2015 10.7* 12.0 - 16.0 g/dL Final  . HCT 10/17/2015 32.3* 35.0 - 47.0 % Final  . MCV 10/17/2015 88.5  80.0 - 100.0 fL Final  . MCH 10/17/2015 29.3   26.0 - 34.0 pg Final  . MCHC 10/17/2015 33.1  32.0 - 36.0 g/dL Final  . RDW 10/17/2015 19.8* 11.5 - 14.5 % Final  . Platelets 10/17/2015 426  150 - 440 K/uL Final  . Neutrophils Relative % 10/17/2015 41   Final  . Neutro Abs 10/17/2015 3.1  1.4 - 6.5 K/uL Final  . Lymphocytes Relative 10/17/2015 45   Final  . Lymphs Abs 10/17/2015 3.4  1.0 - 3.6 K/uL Final  . Monocytes Relative 10/17/2015 13   Final  . Monocytes Absolute 10/17/2015 1.0* 0.2 - 0.9 K/uL Final  . Eosinophils Relative 10/17/2015 0   Final  . Eosinophils Absolute 10/17/2015 0.0  0 - 0.7 K/uL Final  . Basophils Relative 10/17/2015 1   Final  . Basophils Absolute 10/17/2015 0.0  0 - 0.1 K/uL Final  . Sodium 10/17/2015 132* 135 - 145 mmol/L Final  . Potassium 10/17/2015 4.5  3.5 - 5.1 mmol/L Final  . Chloride 10/17/2015 99* 101 - 111 mmol/L Final  . CO2 10/17/2015 26  22 - 32 mmol/L Final  . Glucose, Bld 10/17/2015 113* 65 - 99 mg/dL Final  . BUN 10/17/2015 16  6 - 20 mg/dL Final  . Creatinine, Ser 10/17/2015 0.76  0.44 - 1.00 mg/dL Final  . Calcium 10/17/2015 9.0  8.9 - 10.3 mg/dL Final  . Total Protein 10/17/2015 7.5  6.5 - 8.1 g/dL Final  . Albumin 10/17/2015 3.7  3.5 - 5.0 g/dL Final  . AST 10/17/2015 29  15 - 41 U/L Final  . ALT 10/17/2015 25  14 - 54 U/L Final  . Alkaline Phosphatase 10/17/2015 88  38 - 126 U/L Final  . Total Bilirubin 10/17/2015 0.5  0.3 - 1.2 mg/dL Final  . GFR calc non Af Amer 10/17/2015 >60  >60 mL/min Final  . GFR calc Af Amer 10/17/2015 >60  >60 mL/min Final   Comment: (NOTE) The eGFR has been calculated using the CKD EPI equation. This calculation has  not been validated in all clinical situations. eGFR's persistently <60 mL/min signify possible Chronic Kidney Disease.   . Anion gap 10/17/2015 7  5 - 15 Final  . Magnesium 10/17/2015 2.0  1.7 - 2.4 mg/dL Final      ASSESSMENT:  Cancer of ovary status post suboptimal debulking surgery because of extensive disease stage III Neoadjuvant  chemotherapy with carboplatinum Taxol 2.  Will proceed with the third cycle of chemotherapy with Abraxane on day 1 and 8 and 15 All lab data has been reviewed CA 125 has dropped to 10.2 Patient also will be evaluated by next week by Dr. Theora Gianotti.  Timing of surgery needs to be adjusted with family needs.  Patient's son is going for spinal fusion and desires to postpone surgery until he recovers from that surgery so that he can take care of his mother I would discussed that situation with Dr. Theora Gianotti   Patient expressed understanding and was in agreement with this plan. She also understands that She can call clinic at any time with any questions, concerns, or complaints.    No matching staging information was found for the patient.  Forest Gleason, MD   10/17/2015 9:09 AM

## 2015-10-24 ENCOUNTER — Inpatient Hospital Stay: Payer: Medicare Other

## 2015-10-24 VITALS — BP 115/75 | HR 89 | Temp 98.1°F | Resp 18

## 2015-10-24 DIAGNOSIS — C569 Malignant neoplasm of unspecified ovary: Secondary | ICD-10-CM

## 2015-10-24 DIAGNOSIS — I10 Essential (primary) hypertension: Secondary | ICD-10-CM | POA: Diagnosis not present

## 2015-10-24 DIAGNOSIS — E7800 Pure hypercholesterolemia, unspecified: Secondary | ICD-10-CM | POA: Diagnosis not present

## 2015-10-24 DIAGNOSIS — Z5111 Encounter for antineoplastic chemotherapy: Secondary | ICD-10-CM | POA: Diagnosis present

## 2015-10-24 DIAGNOSIS — C786 Secondary malignant neoplasm of retroperitoneum and peritoneum: Secondary | ICD-10-CM | POA: Insufficient documentation

## 2015-10-24 DIAGNOSIS — Z90710 Acquired absence of both cervix and uterus: Secondary | ICD-10-CM | POA: Insufficient documentation

## 2015-10-24 DIAGNOSIS — Z79899 Other long term (current) drug therapy: Secondary | ICD-10-CM | POA: Insufficient documentation

## 2015-10-24 DIAGNOSIS — K219 Gastro-esophageal reflux disease without esophagitis: Secondary | ICD-10-CM | POA: Diagnosis not present

## 2015-10-24 DIAGNOSIS — Z90722 Acquired absence of ovaries, bilateral: Secondary | ICD-10-CM | POA: Diagnosis not present

## 2015-10-24 LAB — CBC WITH DIFFERENTIAL/PLATELET
Basophils Absolute: 0 K/uL (ref 0–0.1)
Basophils Relative: 1 %
Eosinophils Absolute: 0 K/uL (ref 0–0.7)
Eosinophils Relative: 0 %
HCT: 29.5 % — ABNORMAL LOW (ref 35.0–47.0)
Hemoglobin: 10 g/dL — ABNORMAL LOW (ref 12.0–16.0)
Lymphocytes Relative: 45 %
Lymphs Abs: 3.1 K/uL (ref 1.0–3.6)
MCH: 30.2 pg (ref 26.0–34.0)
MCHC: 33.9 g/dL (ref 32.0–36.0)
MCV: 89 fL (ref 80.0–100.0)
Monocytes Absolute: 0.7 K/uL (ref 0.2–0.9)
Monocytes Relative: 10 %
Neutro Abs: 3.1 K/uL (ref 1.4–6.5)
Neutrophils Relative %: 44 %
Platelets: 314 K/uL (ref 150–440)
RBC: 3.32 MIL/uL — ABNORMAL LOW (ref 3.80–5.20)
RDW: 20 % — ABNORMAL HIGH (ref 11.5–14.5)
WBC: 6.9 K/uL (ref 3.6–11.0)

## 2015-10-24 MED ORDER — PACLITAXEL PROTEIN-BOUND CHEMO INJECTION 100 MG
80.0000 mg/m2 | Freq: Once | INTRAVENOUS | Status: AC
  Administered 2015-10-24: 125 mg via INTRAVENOUS
  Filled 2015-10-24: qty 25

## 2015-10-24 MED ORDER — SODIUM CHLORIDE 0.9 % IV SOLN
Freq: Once | INTRAVENOUS | Status: AC
  Administered 2015-10-24: 10:00:00 via INTRAVENOUS
  Filled 2015-10-24: qty 4

## 2015-10-24 MED ORDER — SODIUM CHLORIDE 0.9 % IV SOLN
Freq: Once | INTRAVENOUS | Status: AC
  Administered 2015-10-24: 10:00:00 via INTRAVENOUS
  Filled 2015-10-24: qty 1000

## 2015-10-24 MED ORDER — HEPARIN SOD (PORK) LOCK FLUSH 100 UNIT/ML IV SOLN
500.0000 [IU] | Freq: Once | INTRAVENOUS | Status: AC
  Administered 2015-10-24: 500 [IU] via INTRAVENOUS
  Filled 2015-10-24: qty 5

## 2015-10-24 MED ORDER — SODIUM CHLORIDE 0.9 % IJ SOLN
10.0000 mL | INTRAMUSCULAR | Status: DC | PRN
Start: 2015-10-24 — End: 2015-10-24
  Administered 2015-10-24: 10 mL via INTRAVENOUS
  Filled 2015-10-24: qty 10

## 2015-10-26 ENCOUNTER — Inpatient Hospital Stay: Payer: Medicare Other | Attending: Oncology | Admitting: Obstetrics and Gynecology

## 2015-10-26 ENCOUNTER — Encounter: Payer: Self-pay | Admitting: Obstetrics and Gynecology

## 2015-10-26 VITALS — BP 129/77 | HR 114 | Temp 98.0°F | Wt 123.0 lb

## 2015-10-26 DIAGNOSIS — Z5111 Encounter for antineoplastic chemotherapy: Secondary | ICD-10-CM | POA: Diagnosis not present

## 2015-10-26 DIAGNOSIS — C786 Secondary malignant neoplasm of retroperitoneum and peritoneum: Secondary | ICD-10-CM | POA: Diagnosis not present

## 2015-10-26 DIAGNOSIS — C569 Malignant neoplasm of unspecified ovary: Secondary | ICD-10-CM | POA: Diagnosis not present

## 2015-10-26 NOTE — Progress Notes (Signed)
RN Mariea Clonts assisted MD with pelvic exam.

## 2015-10-26 NOTE — Progress Notes (Signed)
Gynecologic Oncology Interval Visit   Referring Provider: Dr. Hall Busing  Chief Concern: postoperative visit for newly diagnosed stage IIIC ovarian cancer.   Subjective:  Kathryn Chandler is a 81 y.o. female who is seen initially in consultation from Dr. Hall Busing with suboptimally debulked stage IIIC HGSC of the ovary undergoing chemotherapy with Dr. Oliva Bustard. She has been treated with weekly taxane and carboplatin (initially paclitaxel but was switched to Abraxane on day 1 and 8 and 15). Bevacizumab was added cycles 2 and 3. She is tolerating her therapy with an excellent decrease in CA125 values. She c/o fatigue, weakness, constipation, and pelvic pain. She presents today for consideration of surgery.      Lab Results  Component Value Date   CA125 10.2 10/03/2015     Gynecologic Oncology History   Kathryn Chandler is a pleasant female who is seen initially in consultation from Dr. Hall Busing with suboptimally debulked stage IIIC HGSC. She underwent laparotomy and LSO with resection of pelvic mass on 07/20/2015 and has stage IIIC ovarian cancer. Her disease was not amendable to debulking to no gross residual. She would have required several bowel resections, diaphragm stripping, and liver mobilization and due to peritoneal carcinomatosis would not have been debulked to R0. Preoperative CA125 74.2 and HE4=118  FINAL PATHOLOGY DIAGNOSIS:  A. LEFT OVARY; OOPHORECTOMY:  - HIGH-GRADE SEROUS CARCINOMA.  - FALLOPIAN TUBE NOT IDENTIFIED.      Genetic testing BRCA mutation was negative   Problem List: Patient Active Problem List   Diagnosis Date Noted  . Acid reflux 08/03/2015  . Bilateral cataracts 08/03/2015  . Chicken pox 08/03/2015  . Allergic state 08/03/2015  . Breast lump 08/03/2015  . Hypercholesteremia 08/03/2015  . Malignant melanoma ([place]) 08/03/2015  . Ovarian cancer ([place]) 07/20/2015  . Head or neck swelling, mass, or lump 08/17/2014    Past Medical History: Past Medical History   Diagnosis Date  . Glaucoma   . GERD (gastroesophageal reflux disease)   . Hypercholesteremia   . PONV (postoperative nausea and vomiting)   . Hypertension   . Anxiety   . Cancer [facility])     Past Surgical History: Past Surgical History  Procedure Laterality Date  . Abdominal hysterectomy    . Replacement total knee      Right  . Breast biopsy    . Hand surgery      Right  . Laparotomy Left 07/20/2015    Procedure: LAPAROTOMY with Left Salpingooophrectomy;  Surgeon: Honor Loh Ward, MD;  Location: [facility] ORS;  Service: Gynecology;  Laterality: Left;  . Laparotomy N/A 07/20/2015    Procedure: LAPAROTOMY;  Surgeon: Gillis Ends, MD;  Location: [facility] ORS;  Service: Gynecology;  Laterality: N/A;  . Salpingoophorectomy Right 07/20/2015    Procedure: SALPINGO OOPHORECTOMY;  Surgeon: Gillis Ends, MD;  Location: [facility] ORS;  Service: Gynecology;  Laterality: Right;  . Peripheral vascular catheterization N/A 08/10/2015    Procedure: Glori Luis Cath Insertion;  Surgeon: Algernon Huxley, MD;  Location: [facility];  Service: Cardiovascular;  Laterality: N/A;    Past Gynecologic History:  Menopause: 1976 06/07/2014 Mammogram IMPRESSION: No mammographic evidence of malignancy. A result letter of this screening mammogram will be mailed directly to the patient.   OB History:  OB History  Gravida Para Term Preterm AB SAB TAB Ectopic Multiple Living  3 1   2 2         # Outcome Date GA Lbr Len/2nd Weight Sex Delivery Anes PTL Lv  3  Para           2 SAB           1 SAB               Family History: History reviewed. No pertinent family history.  Social History: Social History   Social History  . Marital Status: Married    Spouse Name: N/A  . Number of Children: N/A  . Years of Education: N/A   Occupational History  .      Retired   Social History Main Topics  . Smoking status: Never Smoker   . Smokeless tobacco: Never Used  . Alcohol Use: No  . Drug Use: No   . Sexual Activity: Not on file   Other Topics Concern  . Not on file   Social History Narrative    Allergies: Allergies  Allergen Reactions  . Clindamycin Hcl Diarrhea    antibiotic colitis  . Clindamycin/Lincomycin Diarrhea  . Codeine Anxiety    Current Medications: Current Outpatient Prescriptions  Medication Sig Dispense Refill  . acetaminophen (TYLENOL) 500 MG tablet Take 500 mg by mouth every 6 (six) hours as needed.     . Ascorbic Acid (VITAMIN C CR) 500 MG CPCR Take 1 capsule by mouth daily.     Marland Kitchen bismuth subsalicylate (PEPTO BISMOL) 262 MG chewable tablet Chew by mouth as needed.     Marland Kitchen dexamethasone (DECADRON) 4 MG tablet Take 1 tab by mouth twice a day the day before chemotherapy. 20 tablet 0  . fluticasone (FLONASE) 50 MCG/ACT nasal spray Place 2 sprays into both nostrils daily as needed.     Marland Kitchen ibuprofen (ADVIL,MOTRIN) 600 MG tablet Take 1 tablet (600 mg total) by mouth every 6 (six) hours. 30 tablet 0  . lidocaine-prilocaine (EMLA) cream Apply 1 application topically as needed. 30 g 3  . LORazepam (ATIVAN) 0.5 MG tablet Take by mouth 2 (two) times daily.     Marland Kitchen LUMIGAN 0.01 % SOLN Place 1 drop into both eyes at bedtime.     . meloxicam (MOBIC) 15 MG tablet Take 15 mg by mouth daily.     Marland Kitchen omeprazole (PRILOSEC) 20 MG capsule Take 20 mg by mouth daily.     . simvastatin (ZOCOR) 20 MG tablet Take 20 mg by mouth at bedtime.     Marland Kitchen menthol-cetylpyridinium (CEPACOL) 3 MG lozenge Take 1 lozenge (3 mg total) by mouth every 2 (two) hours as needed for sore throat. (Patient not taking: Reported on 10/26/2015) 100 tablet 12  . ondansetron (ZOFRAN) 4 MG tablet Take 1 tablet (4 mg total) by mouth every 6 (six) hours as needed for nausea. (Patient not taking: Reported on 10/26/2015) 30 tablet 3  . traMADol (ULTRAM) 50 MG tablet Take 1 tablet (50 mg total) by mouth every 6 (six) hours. (Patient not taking: Reported on 10/26/2015) 65 tablet 0   No current facility-administered medications  for this visit.   Facility-Administered Medications Ordered in Other Visits  Medication Dose Route Frequency Provider Last Rate Last Dose  . diphenhydrAMINE (BENADRYL) injection 12.5 mg  12.5 mg Intravenous Once Forest Gleason, MD      . methylPREDNISolone sodium succinate (SOLU-MEDROL) 125 mg/2 mL injection 100 mg  100 mg Intravenous Once Forest Gleason, MD        Review of Systems General: Fatigue and weakness  HEENT: no complaints  Lungs: no complaints  Cardiac: no complaints  GI: constipation  GU: pelvic pain  Musculoskeletal: no complaints  Extremities:  no complaints  Skin: wound is healed  Neuro: no complaints  Endocrine: no complaints  Psych: no complaints      Objective:  Physical Examination:  BP 129/77 mmHg  Pulse 114  Temp(Src) 98 F (36.7 C) (Tympanic)  Wt 123 lb 0.3 oz (55.8 kg)  Body mass index is 24.83 kg/(m^2).    ECOG Performance Status: 3 - Symptomatic, >50% confined to bed  General appearance: alert, cooperative and appears stated age HEENT:PERRLA, extra ocular movement intact and sclera clear, anicteric Lymphatic survey: Axillary, [place], and inguinal nodes negative. Abdomen: soft, nontender and nondistended. No palpable masses or ascites, no hernias and well healed incision Neurological exam reveals alert, oriented, normal speech, no focal findings or movement disorder noted. Extremities: Negative for edema or cords Neuro: Grossly intact  Pelvic: exam chaperoned by nurse;  Vulva: normal appearing vulva with no masses, tenderness or lesions; Vagina: normal vagina; Adnexa: surgically absent; Uterus: surgically absent, vaginal cuff well healed; Cervix: absent; Rectal: confirmatory, significant stool burden      Assessment:  Kathryn Chandler is a 81 y.o. 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 Fully attempt at [facility] debulking not performed given extent of disease and peritoneal carcinomatosis. Currently undergoing chemotherapy  with weekly abraxane, carboplatin, and bevacizumab. Wound separation, resolved.  Medical co-morbidities complicating care: prior abdominal surgery, frailty, performance status of 3.  Plan:   Problem List Items Addressed This Visit      Genitourinary   Ovarian cancer [facility]) - Primary   Relevant Orders   CT Abdomen Pelvis W Contrast   CT Chest W Contrast      We discussed options for management. I am worried given her performance status that she will not be interval debulking. I have recommended obtaining a CT scan to assess disease burden and response. If she has evidence of response, which I think she will given the improved CA125, we can rediscuss surgery after cycle 4. I did express my concerns with the patient and her son regarding her ability to tolerate surgery and risks of surgery (such as complications and prolong rehabilitation which may delay reinstitution of chemotherapy as well as mortality). We will continue to follow closely.    Gillis Ends, MD    CC:  Larey Days, MD  Albina Billet, MD [address]   [place], [place] [address] [phone]

## 2015-10-31 ENCOUNTER — Inpatient Hospital Stay: Payer: Medicare Other

## 2015-10-31 VITALS — BP 114/76 | HR 99 | Temp 98.5°F | Resp 18

## 2015-10-31 DIAGNOSIS — C569 Malignant neoplasm of unspecified ovary: Secondary | ICD-10-CM

## 2015-10-31 DIAGNOSIS — Z5111 Encounter for antineoplastic chemotherapy: Secondary | ICD-10-CM | POA: Diagnosis not present

## 2015-10-31 LAB — CBC WITH DIFFERENTIAL/PLATELET
BASOS PCT: 0 %
Basophils Absolute: 0 10*3/uL (ref 0–0.1)
EOS ABS: 0 10*3/uL (ref 0–0.7)
EOS PCT: 0 %
HCT: 29.2 % — ABNORMAL LOW (ref 35.0–47.0)
Hemoglobin: 9.7 g/dL — ABNORMAL LOW (ref 12.0–16.0)
Lymphocytes Relative: 21 %
Lymphs Abs: 2.4 10*3/uL (ref 1.0–3.6)
MCH: 30 pg (ref 26.0–34.0)
MCHC: 33.3 g/dL (ref 32.0–36.0)
MCV: 90 fL (ref 80.0–100.0)
MONO ABS: 1.7 10*3/uL — AB (ref 0.2–0.9)
MONOS PCT: 15 %
Neutro Abs: 7.4 10*3/uL — ABNORMAL HIGH (ref 1.4–6.5)
Neutrophils Relative %: 64 %
Platelets: 203 10*3/uL (ref 150–440)
RBC: 3.25 MIL/uL — ABNORMAL LOW (ref 3.80–5.20)
RDW: 21.2 % — AB (ref 11.5–14.5)
WBC: 11.6 10*3/uL — ABNORMAL HIGH (ref 3.6–11.0)

## 2015-10-31 MED ORDER — SODIUM CHLORIDE 0.9 % IV SOLN
Freq: Once | INTRAVENOUS | Status: AC
  Administered 2015-10-31: 10:00:00 via INTRAVENOUS
  Filled 2015-10-31: qty 1000

## 2015-10-31 MED ORDER — HEPARIN SOD (PORK) LOCK FLUSH 100 UNIT/ML IV SOLN
500.0000 [IU] | Freq: Once | INTRAVENOUS | Status: AC
  Administered 2015-10-31: 500 [IU] via INTRAVENOUS
  Filled 2015-10-31: qty 5

## 2015-10-31 MED ORDER — SODIUM CHLORIDE 0.9 % IJ SOLN
10.0000 mL | INTRAMUSCULAR | Status: DC | PRN
  Administered 2015-10-31: 10 mL via INTRAVENOUS
  Filled 2015-10-31: qty 10

## 2015-10-31 MED ORDER — PACLITAXEL PROTEIN-BOUND CHEMO INJECTION 100 MG
80.0000 mg/m2 | Freq: Once | INTRAVENOUS | Status: AC
  Administered 2015-10-31: 125 mg via INTRAVENOUS
  Filled 2015-10-31: qty 25

## 2015-10-31 MED ORDER — SODIUM CHLORIDE 0.9 % IV SOLN
Freq: Once | INTRAVENOUS | Status: AC
  Administered 2015-10-31: 10:00:00 via INTRAVENOUS
  Filled 2015-10-31: qty 4

## 2015-11-04 ENCOUNTER — Inpatient Hospital Stay
Admission: EM | Admit: 2015-11-04 | Discharge: 2015-11-05 | DRG: 394 | Disposition: A | Discharge status: Other Acute Inpatient Hospital | Payer: Medicare Other | Attending: General Surgery | Admitting: General Surgery

## 2015-11-04 ENCOUNTER — Telehealth: Payer: Self-pay | Admitting: *Deleted

## 2015-11-04 ENCOUNTER — Encounter: Payer: Self-pay | Admitting: *Deleted

## 2015-11-04 ENCOUNTER — Ambulatory Visit
Admission: RE | Admit: 2015-11-04 | Discharge: 2015-11-04 | Disposition: A | Discharge status: Home / Self Care | Payer: Medicare Other | Source: Ambulatory Visit | Attending: Obstetrics and Gynecology | Admitting: Obstetrics and Gynecology

## 2015-11-04 DIAGNOSIS — Z885 Allergy status to narcotic agent status: Secondary | ICD-10-CM

## 2015-11-04 DIAGNOSIS — F419 Anxiety disorder, unspecified: Secondary | ICD-10-CM | POA: Diagnosis present

## 2015-11-04 DIAGNOSIS — Z79899 Other long term (current) drug therapy: Secondary | ICD-10-CM | POA: Diagnosis not present

## 2015-11-04 DIAGNOSIS — K219 Gastro-esophageal reflux disease without esophagitis: Secondary | ICD-10-CM | POA: Diagnosis present

## 2015-11-04 DIAGNOSIS — C569 Malignant neoplasm of unspecified ovary: Secondary | ICD-10-CM

## 2015-11-04 DIAGNOSIS — N739 Female pelvic inflammatory disease, unspecified: Secondary | ICD-10-CM | POA: Diagnosis present

## 2015-11-04 DIAGNOSIS — Z96651 Presence of right artificial knee joint: Secondary | ICD-10-CM | POA: Diagnosis present

## 2015-11-04 DIAGNOSIS — N824 Other female intestinal-genital tract fistulae: Principal | ICD-10-CM | POA: Diagnosis present

## 2015-11-04 DIAGNOSIS — I10 Essential (primary) hypertension: Secondary | ICD-10-CM | POA: Diagnosis present

## 2015-11-04 DIAGNOSIS — N823 Fistula of vagina to large intestine: Secondary | ICD-10-CM | POA: Diagnosis present

## 2015-11-04 DIAGNOSIS — H409 Unspecified glaucoma: Secondary | ICD-10-CM | POA: Diagnosis present

## 2015-11-04 DIAGNOSIS — E7800 Pure hypercholesterolemia, unspecified: Secondary | ICD-10-CM | POA: Diagnosis present

## 2015-11-04 LAB — CBC WITH DIFFERENTIAL/PLATELET
BASOS PCT: 0 %
Basophils Absolute: 0 10*3/uL (ref 0–0.1)
EOS ABS: 0 10*3/uL (ref 0–0.7)
Eosinophils Relative: 0 %
HEMATOCRIT: 29.2 % — AB (ref 35.0–47.0)
HEMOGLOBIN: 9.5 g/dL — AB (ref 12.0–16.0)
LYMPHS ABS: 1.6 10*3/uL (ref 1.0–3.6)
Lymphocytes Relative: 13 %
MCH: 29.6 pg (ref 26.0–34.0)
MCHC: 32.7 g/dL (ref 32.0–36.0)
MCV: 90.7 fL (ref 80.0–100.0)
MONOS PCT: 5 %
Monocytes Absolute: 0.7 10*3/uL (ref 0.2–0.9)
NEUTROS ABS: 9.9 10*3/uL — AB (ref 1.4–6.5)
NEUTROS PCT: 82 %
Platelets: 198 10*3/uL (ref 150–440)
RBC: 3.22 MIL/uL — ABNORMAL LOW (ref 3.80–5.20)
RDW: 21.5 % — ABNORMAL HIGH (ref 11.5–14.5)
WBC: 12.2 10*3/uL — AB (ref 3.6–11.0)

## 2015-11-04 LAB — COMPREHENSIVE METABOLIC PANEL
ALT: 27 U/L (ref 14–54)
ANION GAP: 11 (ref 5–15)
AST: 21 U/L (ref 15–41)
Albumin: 3.1 g/dL — ABNORMAL LOW (ref 3.5–5.0)
Alkaline Phosphatase: 81 U/L (ref 38–126)
BUN: 17 mg/dL (ref 6–20)
CHLORIDE: 94 mmol/L — AB (ref 101–111)
CO2: 26 mmol/L (ref 22–32)
CREATININE: 0.61 mg/dL (ref 0.44–1.00)
Calcium: 8.7 mg/dL — ABNORMAL LOW (ref 8.9–10.3)
Glucose, Bld: 111 mg/dL — ABNORMAL HIGH (ref 65–99)
Potassium: 3.6 mmol/L (ref 3.5–5.1)
SODIUM: 131 mmol/L — AB (ref 135–145)
Total Bilirubin: 0.9 mg/dL (ref 0.3–1.2)
Total Protein: 7 g/dL (ref 6.5–8.1)

## 2015-11-04 LAB — WET PREP, GENITAL
Clue Cells Wet Prep HPF POC: NONE SEEN
SPERM: NONE SEEN
Trich, Wet Prep: NONE SEEN
Yeast Wet Prep HPF POC: NONE SEEN

## 2015-11-04 MED ORDER — MORPHINE SULFATE (PF) 4 MG/ML IV SOLN
INTRAVENOUS | Status: AC
  Administered 2015-11-04: 4 mg via INTRAVENOUS
  Filled 2015-11-04: qty 1

## 2015-11-04 MED ORDER — HEPARIN SODIUM (PORCINE) 5000 UNIT/ML IJ SOLN
5000.0000 [IU] | Freq: Three times a day (TID) | INTRAMUSCULAR | Status: DC
  Administered 2015-11-04 – 2015-11-05 (×3): 5000 [IU] via SUBCUTANEOUS
  Filled 2015-11-04 (×3): qty 1

## 2015-11-04 MED ORDER — IOHEXOL 300 MG/ML  SOLN
100.0000 mL | Freq: Once | INTRAMUSCULAR | Status: AC | PRN
  Administered 2015-11-04: 85 mL via INTRAVENOUS

## 2015-11-04 MED ORDER — OXYCODONE-ACETAMINOPHEN 5-325 MG PO TABS: 1.0000 | ORAL_TABLET | ORAL | Status: DC | PRN

## 2015-11-04 MED ORDER — LATANOPROST 0.005 % OP SOLN
1.0000 [drp] | Freq: Every day | OPHTHALMIC | Status: DC
  Administered 2015-11-04: 1 [drp] via OPHTHALMIC
  Filled 2015-11-04 (×2): qty 2.5

## 2015-11-04 MED ORDER — PIPERACILLIN-TAZOBACTAM 3.375 G IVPB
3.3750 g | Freq: Three times a day (TID) | INTRAVENOUS | Status: DC
  Administered 2015-11-04 – 2015-11-05 (×3): 3.375 g via INTRAVENOUS
  Filled 2015-11-04 (×4): qty 50

## 2015-11-04 MED ORDER — ONDANSETRON HCL 4 MG/2ML IJ SOLN
INTRAMUSCULAR | Status: AC
  Administered 2015-11-04: 4 mg via INTRAVENOUS
  Filled 2015-11-04: qty 2

## 2015-11-04 MED ORDER — DIPHENHYDRAMINE HCL 12.5 MG/5ML PO ELIX
12.5000 mg | ORAL_SOLUTION | Freq: Four times a day (QID) | ORAL | Status: DC | PRN
  Filled 2015-11-04: qty 5

## 2015-11-04 MED ORDER — ONDANSETRON HCL 4 MG/2ML IJ SOLN
4.0000 mg | Freq: Once | INTRAMUSCULAR | Status: AC
  Administered 2015-11-04: 4 mg via INTRAVENOUS

## 2015-11-04 MED ORDER — HYDRALAZINE HCL 20 MG/ML IJ SOLN: 10.0000 mg | INTRAMUSCULAR | Status: DC | PRN

## 2015-11-04 MED ORDER — MELOXICAM 7.5 MG PO TABS
15.0000 mg | ORAL_TABLET | Freq: Every day | ORAL | Status: DC
  Administered 2015-11-04 – 2015-11-05 (×2): 15 mg via ORAL
  Filled 2015-11-04 (×2): qty 2

## 2015-11-04 MED ORDER — LORAZEPAM 0.5 MG PO TABS
0.5000 mg | ORAL_TABLET | Freq: Two times a day (BID) | ORAL | Status: DC
  Administered 2015-11-04 – 2015-11-05 (×2): 0.5 mg via ORAL
  Filled 2015-11-04 (×2): qty 1

## 2015-11-04 MED ORDER — SIMVASTATIN 20 MG PO TABS
20.0000 mg | ORAL_TABLET | Freq: Every day | ORAL | Status: DC
  Administered 2015-11-04: 20 mg via ORAL
  Filled 2015-11-04: qty 1

## 2015-11-04 MED ORDER — PIPERACILLIN-TAZOBACTAM 3.375 G IVPB
3.3750 g | Freq: Once | INTRAVENOUS | Status: AC
  Administered 2015-11-04: 3.375 g via INTRAVENOUS
  Filled 2015-11-04: qty 50

## 2015-11-04 MED ORDER — MORPHINE SULFATE (PF) 4 MG/ML IV SOLN: 4.0000 mg | INTRAVENOUS | Status: DC | PRN

## 2015-11-04 MED ORDER — MORPHINE SULFATE (PF) 4 MG/ML IV SOLN
4.0000 mg | Freq: Once | INTRAVENOUS | Status: AC
  Administered 2015-11-04: 4 mg via INTRAVENOUS

## 2015-11-04 MED ORDER — MORPHINE SULFATE (PF) 2 MG/ML IV SOLN: 2.0000 mg | Freq: Once | INTRAVENOUS | Status: AC

## 2015-11-04 MED ORDER — NON FORMULARY: 1.0000 [drp] | Freq: Every day | Status: DC

## 2015-11-04 MED ORDER — ONDANSETRON HCL 4 MG/2ML IJ SOLN: 4.0000 mg | Freq: Four times a day (QID) | INTRAMUSCULAR | Status: DC | PRN

## 2015-11-04 MED ORDER — MORPHINE SULFATE (PF) 4 MG/ML IV SOLN
INTRAVENOUS | Status: AC
  Administered 2015-11-04: 4 mg
  Filled 2015-11-04: qty 1

## 2015-11-04 MED ORDER — DIPHENHYDRAMINE HCL 50 MG/ML IJ SOLN: 12.5000 mg | Freq: Four times a day (QID) | INTRAMUSCULAR | Status: DC | PRN

## 2015-11-04 MED ORDER — MENTHOL 3 MG MT LOZG: 1.0000 | LOZENGE | OROMUCOSAL | Status: DC | PRN

## 2015-11-04 MED ORDER — PANTOPRAZOLE SODIUM 40 MG PO TBEC
40.0000 mg | DELAYED_RELEASE_TABLET | Freq: Every day | ORAL | Status: DC
  Administered 2015-11-04 – 2015-11-05 (×2): 40 mg via ORAL
  Filled 2015-11-04 (×2): qty 1

## 2015-11-04 MED ORDER — ONDANSETRON 4 MG PO TBDP: 4.0000 mg | ORAL_TABLET | Freq: Four times a day (QID) | ORAL | Status: DC | PRN

## 2015-11-04 NOTE — Consult Note (Addendum)
Obstetrics & Gynecology Consultation Note  Date of Consultation: 11/04/2015   Requesting Provider: [facility] ER; Dr. Cinda Quest  Primary OBGYN: Dr. Santiago Glad, [facility] GYN Oncology Primary Care Provider: Albina Billet  Reason for Consultation: Vaginal discharge  History of Present Illness: Kathryn Chandler is a 81 y.o. with the above CC. PMHx is significant for Stage 3c serous ovarian cancer and currently receiving chemotherapy (carboplatin, paclitaxol and avastin). Patient started noticing last night yellowish-tan vaginal discharge and she's had several week history of stable diffuse abdominal discomfort.  She denies any vaginal bleeding. No fevers, chill, nausea, vomiting, chest pain, sob, dysuria. Last BM was 2-3d ago and patient without much of an appetite but that's her baseline, she states. Last seen by Dr. Theora Gianotti on 12/7 and appears to have been doing at her normal and last chemotherapy was on 12/12 and it was Paclitaxel only. She is s/p 07/20/2015 suboptimal debulking by Dr. Theora Gianotti and Dr. Leonides Schanz at [facility] (ex-lap/LSO/LOA/left pelvic sidewall biopsy).  ROS: A 12-point review of systems was performed and negative, except as stated in the above HPI.  OBGYN History: As per HPI.  Past Medical History: Past Medical History  Diagnosis Date  . Glaucoma   . GERD (gastroesophageal reflux disease)   . Hypercholesteremia   . PONV (postoperative nausea and vomiting)   . Hypertension   . Anxiety   . Cancer [facility])     Past Surgical History: Past Surgical History  Procedure Laterality Date  . Abdominal hysterectomy    . Replacement total knee      Right  . Breast biopsy    . Hand surgery      Right  . Laparotomy Left 07/20/2015    Procedure: LAPAROTOMY with Left Salpingooophrectomy;  Surgeon: Honor Loh Ward, MD;  Location: [facility] ORS;  Service: Gynecology;  Laterality: Left;  . Laparotomy N/A 07/20/2015    Procedure: LAPAROTOMY;  Surgeon: Gillis Ends, MD;  Location: [facility] ORS;  Service:  Gynecology;  Laterality: N/A;  . Salpingoophorectomy Right 07/20/2015    Procedure: SALPINGO OOPHORECTOMY;  Surgeon: Gillis Ends, MD;  Location: [facility] ORS;  Service: Gynecology;  Laterality: Right;  . Peripheral vascular catheterization N/A 08/10/2015    Procedure: Glori Luis Cath Insertion;  Surgeon: Algernon Huxley, MD;  Location: [facility];  Service: Cardiovascular;  Laterality: N/A;    Family History:  No family history on file.  Social History:  Social History   Social History  . Marital Status: Married    Spouse Name: N/A  . Number of Children: N/A  . Years of Education: N/A   Occupational History  .      Retired   Social History Main Topics  . Smoking status: Never Smoker   . Smokeless tobacco: Never Used  . Alcohol Use: No  . Drug Use: No  . Sexual Activity: Not on file   Other Topics Concern  . Not on file   Social History Narrative    Allergy: Allergies  Allergen Reactions  . Clindamycin Hcl Diarrhea    antibiotic colitis  . Clindamycin/Lincomycin Diarrhea  . Codeine Anxiety     No current facility-administered medications for this encounter.   Current Outpatient Prescriptions  Medication Sig Dispense Refill  . acetaminophen (TYLENOL) 500 MG tablet Take 500 mg by mouth every 6 (six) hours as needed.     . Ascorbic Acid (VITAMIN C CR) 500 MG CPCR Take 1 capsule by mouth daily.     Marland Kitchen bismuth subsalicylate (PEPTO BISMOL)  262 MG chewable tablet Chew by mouth as needed.     Marland Kitchen dexamethasone (DECADRON) 4 MG tablet Take 1 tab by mouth twice a day the day before chemotherapy. 20 tablet 0  . fluticasone (FLONASE) 50 MCG/ACT nasal spray Place 2 sprays into both nostrils daily as needed.     Marland Kitchen ibuprofen (ADVIL,MOTRIN) 600 MG tablet Take 1 tablet (600 mg total) by mouth every 6 (six) hours. 30 tablet 0  . lidocaine-prilocaine (EMLA) cream Apply 1 application topically as needed. 30 g 3  . LORazepam (ATIVAN) 0.5 MG tablet Take by mouth 2 (two) times  daily.     Marland Kitchen LUMIGAN 0.01 % SOLN Place 1 drop into both eyes at bedtime.     . meloxicam (MOBIC) 15 MG tablet Take 15 mg by mouth daily.     Marland Kitchen menthol-cetylpyridinium (CEPACOL) 3 MG lozenge Take 1 lozenge (3 mg total) by mouth every 2 (two) hours as needed for sore throat. (Patient not taking: Reported on 10/26/2015) 100 tablet 12  . omeprazole (PRILOSEC) 20 MG capsule Take 20 mg by mouth daily.     . ondansetron (ZOFRAN) 4 MG tablet Take 1 tablet (4 mg total) by mouth every 6 (six) hours as needed for nausea. (Patient not taking: Reported on 10/26/2015) 30 tablet 3  . simvastatin (ZOCOR) 20 MG tablet Take 20 mg by mouth at bedtime.     . traMADol (ULTRAM) 50 MG tablet Take 1 tablet (50 mg total) by mouth every 6 (six) hours. (Patient not taking: Reported on 10/26/2015) 65 tablet 0   Facility-Administered Medications Ordered in Other Encounters  Medication Dose Route Frequency Provider Last Rate Last Dose  . diphenhydrAMINE (BENADRYL) injection 12.5 mg  12.5 mg Intravenous Once Forest Gleason, MD      . methylPREDNISolone sodium succinate (SOLU-MEDROL) 125 mg/2 mL injection 100 mg  100 mg Intravenous Once Forest Gleason, MD         Physical Exam:  Current Vital Signs 24h Vital Sign Ranges  T 97.8 F (36.6 C) Temp  Avg: 97.8 F (36.6 C)  Min: 97.8 F (36.6 C)  Max: 97.8 F (36.6 C)  BP (!) 143/87 mmHg BP  Min: 143/87  Max: 173/82  HR (!) 109 Pulse  Avg: 111.5  Min: 109  Max: 114  RR 18 Resp  Avg: 19  Min: 18  Max: 20  SaO2 99 % Not Delivered SpO2  Avg: 98 %  Min: 97 %  Max: 99 %       24 Hour I/O Current Shift I/O  Time Ins Outs       Patient Vitals for the past 24 hrs:  BP Temp Temp src Pulse Resp SpO2 Height Weight  11/04/15 1233 - - - (!) 109 18 99 % - -  11/04/15 1231 (!) 143/87 mmHg - - - - - - -  11/04/15 1148 (!) 173/82 mmHg 97.8 F (36.6 C) Oral (!) 114 20 97 % 4\' 11"  (1.499 m) 54.432 kg (120 lb)    Body mass index is 24.22 kg/(m^2). General appearance: Well nourished, well  developed female in no acute distress.  Neck:  Supple, normal appearance, and no thyromegaly  Cardiovascular:Regular rate and rhythm.  No murmurs, rubs or gallops. Respiratory:  Clear to auscultation bilateral. Normal respiratory effort Abdomen: positive bowel sounds and no masses, hernias; diffusely non tender to palpation, non distended Neuro/Psych:  Normal mood and affect.  Skin:  Warm and dry.  Lymphatic:  No inguinal lymphadenopathy.   Pelvic  exam: deferred. Per the ER: Profuse whitish brown discharge no odor. There is also some redness around the rectum which is most consistent with tinea cruris  Laboratory:  Recent Labs Lab 10/31/15 0927 11/04/15 1230  WBC 11.6* 12.2*  HGB 9.7* 9.5*  HCT 29.2* 29.2*  PLT 203 198  NEUTOPHILPCT 64 82  MONOPCT 15 5   Wet prep: many WBCs Pending: CMP  Results for RAZIA, CURRENT (MRN [id number]) as of 11/04/2015 13:21  Ref. Range 07/18/2015 11:05 08/03/2015 11:46 10/03/2015 09:42  CA 125 Latest Ref Range: 0.0-38.1 U/mL 74.2 (H) 84.1 (H) 10.2    Imaging:  CLINICAL DATA: Vaginal discharge for 2 days. Newly diagnosed stage IIIC ovarian cancer. Prior surgery and ongoing chemotherapy.  EXAM: CT CHEST, ABDOMEN, AND PELVIS WITH CONTRAST  TECHNIQUE: Multidetector CT imaging of the chest, abdomen and pelvis was performed following the standard protocol during bolus administration of intravenous contrast.  CONTRAST: 61mL OMNIPAQUE IOHEXOL 300 MG/ML SOLN  COMPARISON: 07/04/2015 pelvic ultrasound. No prior CT.  FINDINGS: CT CHEST FINDINGS  Mediastinum/Nodes: No supraclavicular adenopathy. A right-sided Port-A-Cath terminates in the mid right atrium. Aortic and branch vessel atherosclerosis. Tortuous descending thoracic aorta. Moderate cardiomegaly, without pericardial effusion. No central pulmonary embolism, on this non-dedicated study. Mild ductus diverticulum including on image 21/series 2. No mediastinal or hilar  adenopathy.  Lungs/Pleura: No pleural fluid. Mild diffuse nonspecific subpleural interstitial prominence.  Left lower lobe subpleural pulmonary nodule measures 4 mm on image 37/series 4.  Musculoskeletal: No acute osseous abnormality.  CT ABDOMEN PELVIS FINDINGS  Hepatobiliary: Focal steatosis adjacent the falciform ligament. No suspicious liver lesion. Normal gallbladder, without biliary ductal dilatation.  Pancreas: Normal, without mass or ductal dilatation. A periampullary duodenal diverticulum is incidentally noted.  Spleen: Normal in size, without focal abnormality.  Adrenals/Urinary Tract: Normal adrenal glands. Too small to characterize right renal lesion. Lower pole right renal scarring. Normal left kidney, without hydronephrosis. Normal urinary bladder.  Stomach/Bowel: Stool-filled sigmoid colon with small volume diverticula. No bowel obstruction. Normal terminal ileum. Normal small bowel.  Vascular/Lymphatic: Aortic and branch vessel atherosclerosis. Circumaortic left renal vein. No retroperitoneal or retrocrural adenopathy. No pelvic sidewall adenopathy.  Reproductive: Hysterectomy. 1.5 cm fluid density structure in the right hemipelvis is likely a residual follicle.  Other: Fluid and gas collection within the pelvic cul-de-sac. This is intimately associated with the sigmoid colon and tracks towards the vaginal fornix. This measures on the order of 4.8 x 5.4 cm on image 100/series 2. Concurrent air within the vagina.  Omental thickening and mild nodularity, including within the left side of the abdomen on image 67/series 2. 9 mm.  Musculoskeletal: Degenerative disc disease at the lumbosacral junction. Convex right thoracolumbar spine curvature.  IMPRESSION: 1. Gas and fluid collection within the pelvic cul-de-sac, consistent with abscess. Given the clinical history, and presence of air in the vagina, this likely communicates with the vagina.  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 2. Omental/peritoneal metastasis. No abdominal pelvic adenopathy. 3. Nonspecific left lower lobe pulmonary nodule. Otherwise, no evidence of metastatic disease within the chest. 4. Atherosclerosis, including within the coronary arteries. These results will be called to the ordering clinician or representative by the Radiologist Assistant, and communication documented in the PACS or zVision Dashboard.   Electronically Signed  By: Abigail Miyamoto M.D.  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  Dr. Cinda Quest has been in contact with Dr. Theora Gianotti and [facility] is on divert currently but hopefully will have beds open up sometime later this afternoon and they will take her when bedspace is available.  In the interm, she has been started on Zosyn, by the ER, and I recommend that if she needs admission, while bed space opens up, to d/w General Surgery.  Time spent with the patient was 30 minutes, with more than 50% of the time spent in face-to-face education, counseling and care coordination.   Durene Romans MD [facility] OBGYN Pager [phone]

## 2015-11-04 NOTE — ED Provider Notes (Addendum)
[facility] Emergency Department Provider Note  ____________________________________________  Time seen: Approximately 12:31 PM  I have reviewed the triage vital signs and the nursing notes.   HISTORY  Chief Complaint Vaginal Discharge    HPI Kathryn Chandler is a 81 y.o. female patient has ovarian cancer she is getting chemotherapy at present. Patient had surgery for the ovarian cancer in August. He had a CT scan here today and was sent to the emergency room. CT scan shows an abscess intimately associated with sigmoid colon which appears to be draining through the vagina. Patient has had some vaginal and rectal pain for the last few days. Patient developed a large amount of purulent vaginal discharge this morning. I asked the patient she wanted any pain medicine and she said she did not at this time. Pain is mild to moderate deep ache.   Past Medical History  Diagnosis Date  . Glaucoma   . GERD (gastroesophageal reflux disease)   . Hypercholesteremia   . PONV (postoperative nausea and vomiting)   . Hypertension   . Anxiety   . Cancer [facility])     Patient Active Problem List   Diagnosis Date Noted  . Acid reflux 08/03/2015  . Bilateral cataracts 08/03/2015  . Chicken pox 08/03/2015  . Allergic state 08/03/2015  . Breast lump 08/03/2015  . Hypercholesteremia 08/03/2015  . Malignant melanoma ([place]) 08/03/2015  . Ovarian cancer ([place]) 07/20/2015  . Head or neck swelling, mass, or lump 08/17/2014    Past Surgical History  Procedure Laterality Date  . Abdominal hysterectomy    . Replacement total knee      Right  . Breast biopsy    . Hand surgery      Right  . Laparotomy Left 07/20/2015    Procedure: LAPAROTOMY with Left Salpingooophrectomy;  Surgeon: Honor Loh Ward, MD;  Location: [facility] ORS;  Service: Gynecology;  Laterality: Left;  . Laparotomy N/A 07/20/2015    Procedure: LAPAROTOMY;  Surgeon: Gillis Ends, MD;  Location: [facility] ORS;  Service:  Gynecology;  Laterality: N/A;  . Salpingoophorectomy Right 07/20/2015    Procedure: SALPINGO OOPHORECTOMY;  Surgeon: Gillis Ends, MD;  Location: [facility] ORS;  Service: Gynecology;  Laterality: Right;  . Peripheral vascular catheterization N/A 08/10/2015    Procedure: Glori Luis Cath Insertion;  Surgeon: Algernon Huxley, MD;  Location: [place] CV LAB;  Service: Cardiovascular;  Laterality: N/A;    Current Outpatient Rx  Name  Route  Sig  Dispense  Refill  . acetaminophen (TYLENOL) 500 MG tablet   Oral   Take 500 mg by mouth every 6 (six) hours as needed.          . Ascorbic Acid (VITAMIN C CR) 500 MG CPCR   Oral   Take 1 capsule by mouth daily.          Marland Kitchen bismuth subsalicylate (PEPTO BISMOL) 262 MG chewable tablet   Oral   Chew by mouth as needed.          Marland Kitchen dexamethasone (DECADRON) 4 MG tablet      Take 1 tab by mouth twice a day the day before chemotherapy.   20 tablet   0   . fluticasone (FLONASE) 50 MCG/ACT nasal spray   Each Nare   Place 2 sprays into both nostrils daily as needed.          Marland Kitchen ibuprofen (ADVIL,MOTRIN) 600 MG tablet   Oral   Take 1 tablet (600 mg total) by mouth  every 6 (six) hours.   30 tablet   0   . lidocaine-prilocaine (EMLA) cream   Topical   Apply 1 application topically as needed.   30 g   3   . LORazepam (ATIVAN) 0.5 MG tablet   Oral   Take by mouth 2 (two) times daily.          Marland Kitchen LUMIGAN 0.01 % SOLN   Both Eyes   Place 1 drop into both eyes at bedtime.            Dispense as written.   . meloxicam (MOBIC) 15 MG tablet   Oral   Take 15 mg by mouth daily.          Marland Kitchen menthol-cetylpyridinium (CEPACOL) 3 MG lozenge   Oral   Take 1 lozenge (3 mg total) by mouth every 2 (two) hours as needed for sore throat. Patient not taking: Reported on 10/26/2015   100 tablet   12   . omeprazole (PRILOSEC) 20 MG capsule   Oral   Take 20 mg by mouth daily.          . ondansetron (ZOFRAN) 4 MG tablet   Oral   Take 1 tablet  (4 mg total) by mouth every 6 (six) hours as needed for nausea. Patient not taking: Reported on 10/26/2015   30 tablet   3   . simvastatin (ZOCOR) 20 MG tablet   Oral   Take 20 mg by mouth at bedtime.          . traMADol (ULTRAM) 50 MG tablet   Oral   Take 1 tablet (50 mg total) by mouth every 6 (six) hours. Patient not taking: Reported on 10/26/2015   65 tablet   0     Allergies Clindamycin hcl; Clindamycin/lincomycin; and Codeine  No family history on file.  Social History Social History  Substance Use Topics  . Smoking status: Never Smoker   . Smokeless tobacco: Never Used  . Alcohol Use: No    Review of Systems Constitutional: No fever/chills Eyes: No visual changes. ENT: No sore throat. Cardiovascular: Denies chest pain. Respiratory: Denies shortness of breath. Gastrointestinal the history of present illness Genitourinary: Negative for dysuria. Musculoskeletal: Negative for back pain. Skin: Negative for rash. Neurological: Negative for headaches, focal weakness or numbness.  10-point ROS otherwise negative.  ____________________________________________   PHYSICAL EXAM:  VITAL SIGNS: ED Triage Vitals  Enc Vitals Group     BP 11/04/15 1148 173/82 mmHg     Pulse Rate 11/04/15 1148 114     Resp 11/04/15 1148 20     Temp 11/04/15 1148 97.8 F (36.6 C)     Temp Source 11/04/15 1148 Oral     SpO2 11/04/15 1148 97 %     Weight 11/04/15 1148 120 lb (54.432 kg)     Height 11/04/15 1148 4\' 11"  (1.499 m)     Head Cir --      Peak Flow --      Pain Score 11/04/15 1147 5     Pain Loc --      Pain Edu? --      Excl. in [place]? --     Constitutional: Alert and oriented. Well appearing and in no acute distress. Eyes: Conjunctivae are normal. PERRL. EOMI. Head: Atraumatic. Nose: No congestion/rhinnorhea. Mouth/Throat: Mucous membranes are moist and slightly pale.  Oropharynx non-erythematous. Neck: No stridor.   Cardiovascular: Normal rate, regular rhythm.  Grossly normal heart sounds.  Good peripheral circulation. Respiratory:  Normal respiratory effort.  No retractions. Lungs CTAB. Gastrointestinal: Soft and nontender except for in the lower abdomen to deep palpation was tender. No distention. No abdominal bruits. No CVA tenderness. Genitourinary: Profuse whitish brown discharge no odor. There is also some redness around the rectum which is most consistent with tinea cruris Musculoskeletal: No lower extremity tenderness, positive  edema.  No joint effusions. Neurologic:  Normal speech and language. No gross focal neurologic deficits are appreciated. No gait instability. Skin:  Skin is warm, dry and intact. No rash noted. Psychiatric: Mood and affect are normal. Speech and behavior are normal.  ____________________________________________   LABS (all labs ordered are listed, but only abnormal results are displayed)  Labs Reviewed  WET PREP, GENITAL - Abnormal; Notable for the following:    WBC, Wet Prep HPF POC MANY (*)    All other components within normal limits  COMPREHENSIVE METABOLIC PANEL - Abnormal; Notable for the following:    Sodium 131 (*)    Chloride 94 (*)    Glucose, Bld 111 (*)    Calcium 8.7 (*)    Albumin 3.1 (*)    All other components within normal limits  CBC WITH DIFFERENTIAL/PLATELET - Abnormal; Notable for the following:    WBC 12.2 (*)    RBC 3.22 (*)    Hemoglobin 9.5 (*)    HCT 29.2 (*)    RDW 21.5 (*)    Neutro Abs 9.9 (*)    All other components within normal limits  WOUND CULTURE   ____________________________________________  EKG   ____________________________________________  RADIOLOGY  As read by radiology. The most significant finding is the abscess "intermittently associated with" the sigmoid colon and which appears to be draining through the vagina ____________________________________________   PROCEDURES Discussed the patient in detail with GYN and then with her cancer surgeon. Plan is  to admit patient to [facility] hold antibiotics until such time as they can culture the abscess. ----------------------------------------- 3:47 PM on 11/04/2015 -----------------------------------------   [facility] is still on bypass we will admit the patient here. We'll start Zosyn  ____________________________________________   INITIAL IMPRESSION / ASSESSMENT AND PLAN / ED COURSE  Pertinent labs & imaging results that were available during my care of the patient were reviewed by me and considered in my medical decision making (see chart for details).   ____________________________________________   FINAL CLINICAL IMPRESSION(S) / ED DIAGNOSES  Final diagnoses:  Colovaginal fistula      Nena Polio, MD 11/04/15 1547  EKG was done showing sinus tachycardia at 109 premature atrial contractions also normal axis no acute changes EKG was read and reviewed by me interpreted by me  Nena Polio, MD 11/10/15 [phone]

## 2015-11-04 NOTE — H&P (Signed)
Patient ID: Kathryn Chandler, female   DOB: [birth year], 81 y.o.   MRN: [id number]  CC: Vaginal Discharge  HPI Kathryn Chandler is a 81 y.o. female who presents to emergency department for evaluation of vaginal discharge. She has a history significant of ovarian cancer that was treated surgically by a [facility] gynecological oncologist. Patient reports that she's been having low pelvic pain for quite some time however the vaginal discharge started today. The discharge was noted to include gas and smell of stool. She denies any fevers, chills, nausea, vomiting, diarrhea or constipation. She is continuing to undergo chemotherapy for her ovarian cancer. Workup in the emergency department showed a pelvic abscess that appears to be evidence of a colovaginal fistula. Her last bowel movement was today in the emergency department. Patient has already been accepted for transfer to [facility] however there is no available bed at the [facility] at this time. Patient voiced a desire to stay at [facility] but she and family voice understanding of reason for transfer.  HPI  Past Medical History  Diagnosis Date  . Glaucoma   . GERD (gastroesophageal reflux disease)   . Hypercholesteremia   . PONV (postoperative nausea and vomiting)   . Hypertension   . Anxiety   . Cancer [facility])     Past Surgical History  Procedure Laterality Date  . Abdominal hysterectomy    . Replacement total knee      Right  . Breast biopsy    . Hand surgery      Right  . Laparotomy Left 07/20/2015    Procedure: LAPAROTOMY with Left Salpingooophrectomy;  Surgeon: Honor Loh Ward, MD;  Location: [facility] ORS;  Service: Gynecology;  Laterality: Left;  . Laparotomy N/A 07/20/2015    Procedure: LAPAROTOMY;  Surgeon: Gillis Ends, MD;  Location: [facility] ORS;  Service: Gynecology;  Laterality: N/A;  . Salpingoophorectomy Right 07/20/2015    Procedure: SALPINGO OOPHORECTOMY;  Surgeon: Gillis Ends, MD;  Location: [facility] ORS;  Service: Gynecology;   Laterality: Right;  . Peripheral vascular catheterization N/A 08/10/2015    Procedure: Glori Luis Cath Insertion;  Surgeon: Algernon Huxley, MD;  Location: [place] CV LAB;  Service: Cardiovascular;  Laterality: N/A;    No family history on file.  Social History Social History  Substance Use Topics  . Smoking status: Never Smoker   . Smokeless tobacco: Never Used  . Alcohol Use: No    Allergies  Allergen Reactions  . Clindamycin/Lincomycin Diarrhea and Other (See Comments)    Reaction:  Antibiotic colitis   . Codeine Anxiety    No current facility-administered medications for this encounter.   Current Outpatient Prescriptions  Medication Sig Dispense Refill  . acetaminophen (TYLENOL) 500 MG tablet Take 500-1,000 mg by mouth every 6 (six) hours as needed for mild pain.     . bimatoprost (LUMIGAN) 0.01 % SOLN Place 1 drop into both eyes at bedtime.    . fluticasone (FLONASE) 50 MCG/ACT nasal spray Place 2 sprays into both nostrils daily as needed for rhinitis.     Marland Kitchen ibuprofen (ADVIL,MOTRIN) 200 MG tablet Take 400 mg by mouth every 6 (six) hours as needed for mild pain.    Marland Kitchen lidocaine-prilocaine (EMLA) cream Apply 1 application topically as needed. (Patient taking differently: Apply 1 application topically as needed (prior to accessing port). ) 30 g 3  . LORazepam (ATIVAN) 0.5 MG tablet Take 0.5 mg by mouth 2 (two) times daily.     . meloxicam (MOBIC) 15 MG tablet  Take 15 mg by mouth daily.     Marland Kitchen omeprazole (PRILOSEC) 20 MG capsule Take 20 mg by mouth daily.     . ondansetron (ZOFRAN) 4 MG tablet Take 1 tablet (4 mg total) by mouth every 6 (six) hours as needed for nausea. 30 tablet 3  . simvastatin (ZOCOR) 20 MG tablet Take 20 mg by mouth at bedtime.     . vitamin C (ASCORBIC ACID) 500 MG tablet Take 500 mg by mouth daily.    Marland Kitchen menthol-cetylpyridinium (CEPACOL) 3 MG lozenge Take 1 lozenge (3 mg total) by mouth every 2 (two) hours as needed for sore throat. (Patient not taking: Reported  on 10/26/2015) 100 tablet 12  . traMADol (ULTRAM) 50 MG tablet Take 1 tablet (50 mg total) by mouth every 6 (six) hours. (Patient not taking: Reported on 10/26/2015) 65 tablet 0   Facility-Administered Medications Ordered in Other Encounters  Medication Dose Route Frequency Provider Last Rate Last Dose  . diphenhydrAMINE (BENADRYL) injection 12.5 mg  12.5 mg Intravenous Once Forest Gleason, MD      . methylPREDNISolone sodium succinate (SOLU-MEDROL) 125 mg/2 mL injection 100 mg  100 mg Intravenous Once Forest Gleason, MD         Review of Systems A multi-point review of systems was asked and was negative except findings documented in the history of present illness  Physical Exam Blood pressure 148/89, pulse 107, temperature 97.8 F (36.6 C), temperature source Oral, resp. rate 18, height 4\' 11"  (1.499 m), weight 54.432 kg (120 lb), SpO2 98 %. CONSTITUTIONAL: Resting in bed in no acute distress. EYES: Pupils are equal, round, and reactive to light, Sclera are non-icteric. EARS, NOSE, MOUTH AND THROAT: The oropharynx is clear. The oral mucosa is pink and moist. Hearing is intact to voice. LYMPH NODES:  Lymph nodes in the neck are normal. RESPIRATORY:  Lungs are clear. There is normal respiratory effort, with equal breath sounds bilaterally, and without pathologic use of accessory muscles. CARDIOVASCULAR: Heart is regular without murmurs, gallops, or rubs. Chemo-Port in place in the right upper chest. GI: The abdomen is thin, soft, nontender, and nondistended. There are no palpable masses. There is no hepatosplenomegaly. There are normal bowel sounds in all quadrants. Well-healed lower midline incision. GU: Performed by the emergency department with a description of profuse brown and white discharge from the vagina and surrounding redness to the rectum..   MUSCULOSKELETAL: Normal muscle strength and tone. No cyanosis or edema.   SKIN: Turgor is good and there are no pathologic skin lesions or  ulcers. NEUROLOGIC: Motor and sensation is grossly normal. Cranial nerves are grossly intact. PSYCH:  Oriented to person, place and time. Affect is normal.  Data Reviewed Patient's images and labs reviewed. Labs showed leukocytosis of 12.2 and a mild anemia. CT scan reviewed by myself shows evidence of a pelvic abscess that does appear to connect between the sigmoid colon and the vaginal cuff. As marked by radiology that also appears to be mesenteric implants consistent with metastatic ovarian cancer. I have personally reviewed the patient's imaging, laboratory findings and medical records.    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 She has already been accepted by the surgical team at [facility] however there are no available beds. Given this we will admit to the [place] floor at [facility] under the surgical service. Plan for IV  quadrant next. Should her symptoms worsen would consider percutaneous versus intra-abdominal drainage. Should her fistula become even more symptomatic she may require colonic diversion. Plan however, is to transfer to [facility] once a bed is available. Discussed at length with the patient and her family that her surgical needs could be better suited a tertiary care center should this truly be a colovaginal fistula related to either recurrent cancer or trauma from her prior pelvic cancer operations.     Time spent with the patient was 60 minutes, with more than 50% of the time spent in face-to-face education, counseling and care coordination.     Clayburn Pert, MD FACS General Surgeon 11/04/2015, 4:34 PM

## 2015-11-04 NOTE — Telephone Encounter (Signed)
Patient in CT.  States patient is complaining of "horrible" vaginal discharge.  States she is feeling bad and thinks she needs to be admitted to the hospital.  She had surgery with Dr. Theora Gianotti several months ago.   Spoke with on call physician, Dr. Mike Gip who states patient needs to either see PCP or go to ED for evaluation.  Called CT and spoke to [facility] regarding MD recommendation.

## 2015-11-04 NOTE — Progress Notes (Signed)
Pt to room. A&Ox4. VSS. Oriented to room and safety plan.

## 2015-11-04 NOTE — ED Notes (Signed)
Last pm developed something coming out of her vaginal cream colored, deneis abd pain, has had some rectal and vagina pain

## 2015-11-05 ENCOUNTER — Ambulatory Visit (HOSPITAL_COMMUNITY)
Admission: AD | Admit: 2015-11-05 | Discharge: 2015-11-05 | Disposition: A | Discharge status: Home / Self Care | Payer: Medicare Other | Source: Other Acute Inpatient Hospital | Attending: Oncology | Admitting: Oncology

## 2015-11-05 DIAGNOSIS — N823 Fistula of vagina to large intestine: Secondary | ICD-10-CM | POA: Insufficient documentation

## 2015-11-05 DIAGNOSIS — I10 Essential (primary) hypertension: Secondary | ICD-10-CM | POA: Insufficient documentation

## 2015-11-05 LAB — CBC
HCT: 26.5 % — ABNORMAL LOW (ref 35.0–47.0)
HEMOGLOBIN: 8.7 g/dL — AB (ref 12.0–16.0)
MCH: 29.7 pg (ref 26.0–34.0)
MCHC: 32.9 g/dL (ref 32.0–36.0)
MCV: 90.4 fL (ref 80.0–100.0)
Platelets: 187 10*3/uL (ref 150–440)
RBC: 2.93 MIL/uL — AB (ref 3.80–5.20)
RDW: 21.3 % — ABNORMAL HIGH (ref 11.5–14.5)
WBC: 9.9 10*3/uL (ref 3.6–11.0)

## 2015-11-05 LAB — BASIC METABOLIC PANEL
Anion gap: 8 (ref 5–15)
BUN: 12 mg/dL (ref 6–20)
CHLORIDE: 97 mmol/L — AB (ref 101–111)
CO2: 29 mmol/L (ref 22–32)
Calcium: 8.5 mg/dL — ABNORMAL LOW (ref 8.9–10.3)
Creatinine, Ser: 0.74 mg/dL (ref 0.44–1.00)
GFR calc Af Amer: >60 mL/min (ref >60)
GFR calc non Af Amer: >60 mL/min (ref >60)
Glucose, Bld: 101 mg/dL — ABNORMAL HIGH (ref 65–99)
POTASSIUM: 3.6 mmol/L (ref 3.5–5.1)
SODIUM: 134 mmol/L — AB (ref 135–145)

## 2015-11-05 LAB — PHOSPHORUS: PHOSPHORUS: 3.5 mg/dL (ref 2.5–4.6)

## 2015-11-05 LAB — MAGNESIUM: MAGNESIUM: 1.6 mg/dL — AB (ref 1.7–2.4)

## 2015-11-05 MED ORDER — SALINE SPRAY 0.65 % NA SOLN
1.0000 | NASAL | Status: DC | PRN
  Filled 2015-11-05 (×2): qty 44

## 2015-11-05 MED ORDER — ENSURE ENLIVE PO LIQD: 237.0000 mL | Freq: Two times a day (BID) | ORAL | Status: DC

## 2015-11-05 NOTE — Plan of Care (Signed)
Problem: Safety: Goal: Ability to remain free from injury will improve Outcome: Progressing No injury to date. Pt is high fall risk with precautions in place  Problem: Physical Regulation: Goal: Ability to maintain clinical measurements within normal limits will improve Outcome: Progressing Pt is weak. Encourage to call for assistance Goal: Will remain free from infection Outcome: Progressing No s/s of infection at this time  Problem: Fluid Volume: Goal: Ability to maintain a balanced intake and output will improve Outcome: Progressing Tolerating diet with adequate output

## 2015-11-05 NOTE — Progress Notes (Signed)
Initial Nutrition Assessment  DOCUMENTATION CODES:   Non-severe (moderate) malnutrition in context of chronic illness  INTERVENTION:  Meals and snacks: Cater to pt preferences. Hostess to drop off menu for pt Medical Nutrition Supplement Therapy: Recommend Ensure Enlive po BID, each supplement provides 350 kcal and 20 grams of protein    NUTRITION DIAGNOSIS:   Inadequate oral intake related to cancer and cancer related treatments, poor appetite as evidenced by per patient/family report.    GOAL:   Patient will meet greater than or equal to 90% of their needs    MONITOR:    (Energy intake, Gastrointestinal profile)  REASON FOR ASSESSMENT:   Malnutrition Screening Tool    ASSESSMENT:     Pt admitted with colovaginal fistula  Past Medical History  Diagnosis Date  . Glaucoma   . GERD (gastroesophageal reflux disease)   . Hypercholesteremia   . PONV (postoperative nausea and vomiting)   . Hypertension   . Anxiety   . Cancer [facility])     Current Nutrition: ate eggs and bacon this am and few grits per pt  Food/Nutrition-Related History: Pt reports appetite has not been good since Aug of 2016   Scheduled Medications:  . heparin  5,000 Units Subcutaneous 3 times per day  . latanoprost  1 drop Both Eyes QHS  . LORazepam  0.5 mg Oral BID  . meloxicam  15 mg Oral Daily  . pantoprazole  40 mg Oral QAC breakfast  . piperacillin-tazobactam (ZOSYN)  IV  3.375 g Intravenous 3 times per day  . simvastatin  20 mg Oral QHS       Electrolyte/Renal Profile and Glucose Profile:   Recent Labs Lab 11/04/15 1230 11/05/15 0521  NA 131* 134*  K 3.6 3.6  CL 94* 97*  CO2 26 29  BUN 17 12  CREATININE 0.61 0.74  CALCIUM 8.7* 8.5*  MG  --  1.6*  PHOS  --  3.5  GLUCOSE 111* 101*   Protein Profile:   Recent Labs Lab 11/04/15 1230  ALBUMIN 3.1*    Gastrointestinal Profile: Last BM: 12/16   Nutrition-Focused Physical Exam Findings: Nutrition-Focused physical  exam completed. Findings are no fat depletion, mild muscle depletion, and mild edema.      Weight Change: 3% wt loss in the last 3 months per wt encounters     Diet Order:  Diet regular Room service appropriate?: Yes; Fluid consistency:: Thin  Skin:   reviewed   Height:   Ht Readings from Last 1 Encounters:  11/04/15 4\' 11"  (1.499 m)    Weight:   Wt Readings from Last 1 Encounters:  11/04/15 120 lb (54.432 kg)    Ideal Body Weight:     BMI:  Body mass index is 24.22 kg/(m^2).  Estimated Nutritional Needs:   Kcal:  BEE 895 kcals (IF 1.1-1.3, AF1.3)1279-1512 kcals/d.   Protein:  (1.1-1.3 g/kg) 65-77 g/d  Fluid:  (30-60ml/kg) 1770-2017ml/d  EDUCATION NEEDS:   No education needs identified at this time  [place]. Zenia Resides, [place], [place] (pager)

## 2015-11-05 NOTE — Discharge Summary (Signed)
Patient ID: Kathryn Chandler MRN: [id number] DOB/AGE: 23-Sep-1929 81 y.o.  Admit date: 11/04/2015 Discharge date: 11/05/2015  Discharge Diagnoses:  Colovaginal Fistula  Procedures Performed: CT Scan  Discharged Condition: good  Hospital Course: Admitted from ER secondary to no bed availability at [facility]. Started on IV antibiotics. Had continue vaginal drainage since discharge but improved labs and pain. Tolerated diet and remained non-toxic.  Discharge Orders: Transfer to [facility]  Disposition: Transfer to [facility]  Discharge Medications:  Current facility-administered medications:  .  diphenhydrAMINE (BENADRYL) 12.5 MG/5ML elixir 12.5 mg, 12.5 mg, Oral, Q6H PRN **OR** diphenhydrAMINE (BENADRYL) injection 12.5 mg, 12.5 mg, Intravenous, Q6H PRN, Clayburn Pert, MD .  feeding supplement (ENSURE ENLIVE) (ENSURE ENLIVE) liquid 237 mL, 237 mL, Oral, BID BM, Clayburn Pert, MD, 237 mL at 11/05/15 1142 .  heparin injection 5,000 Units, 5,000 Units, Subcutaneous, 3 times per day, Clayburn Pert, MD, 5,000 Units at 11/05/15 1432 .  hydrALAZINE (APRESOLINE) injection 10 mg, 10 mg, Intravenous, Q2H PRN, Clayburn Pert, MD .  latanoprost (XALATAN) 0.005 % ophthalmic solution 1 drop, 1 drop, Both Eyes, QHS, Clayburn Pert, MD, 1 drop at 11/04/15 2143 .  LORazepam (ATIVAN) tablet 0.5 mg, 0.5 mg, Oral, BID, Clayburn Pert, MD, 0.5 mg at 11/05/15 0755 .  meloxicam (MOBIC) tablet 15 mg, 15 mg, Oral, Daily, Clayburn Pert, MD, 15 mg at 11/05/15 0755 .  menthol-cetylpyridinium (CEPACOL) lozenge 3 mg, 1 lozenge, Oral, Q2H PRN, Clayburn Pert, MD .  morphine 4 MG/ML injection 4 mg, 4 mg, Intravenous, Q4H PRN, Clayburn Pert, MD .  ondansetron (ZOFRAN-ODT) disintegrating tablet 4 mg, 4 mg, Oral, Q6H PRN **OR** ondansetron (ZOFRAN) injection 4 mg, 4 mg, Intravenous, Q6H PRN, Clayburn Pert, MD .  oxyCODONE-acetaminophen (PERCOCET/ROXICET) 5-325 MG per tablet 1-2 tablet, 1-2  tablet, Oral, Q4H PRN, Clayburn Pert, MD .  pantoprazole (PROTONIX) EC tablet 40 mg, 40 mg, Oral, QAC breakfast, Clayburn Pert, MD, 40 mg at 11/05/15 0755 .  piperacillin-tazobactam (ZOSYN) IVPB 3.375 g, 3.375 g, Intravenous, 3 times per day, Clayburn Pert, MD, 3.375 g at 11/05/15 1432 .  simvastatin (ZOCOR) tablet 20 mg, 20 mg, Oral, QHS, Clayburn Pert, MD, 20 mg at 11/04/15 2115 .  sodium chloride (OCEAN) 0.65 % nasal spray 1 spray, 1 spray, Each Nare, PRN, Clayburn Pert, MD  Facility-Administered Medications Ordered in Other Encounters:  .  diphenhydrAMINE (BENADRYL) injection 12.5 mg, 12.5 mg, Intravenous, Once, Forest Gleason, MD .  methylPREDNISolone sodium succinate (SOLU-MEDROL) 125 mg/2 mL injection 100 mg, 100 mg, Intravenous, Once, Forest Gleason, MD  Follwup: With Oncology at [facility] after discharge from [facility].   Signed: Clayburn Pert 11/05/2015, 3:15 PM

## 2015-11-05 NOTE — Progress Notes (Signed)
Txr to [facility] via [facility]. Pt stable, IV abx infusing

## 2015-11-05 NOTE — Progress Notes (Signed)
CC: Vaginal drainage Subjective: Patient states that she feels better this morning than she did last night. She states that she is continued to have some drainage from her vagina however it is improving. She denies any fevers, chills, nausea and vomiting. She's been very happy with the care she has received here. As the time of this note still awaiting bed at [facility].  Objective: Vital signs in last 24 hours: Temp:  [98.1 F (36.7 C)-98.7 F (37.1 C)] 98.7 F (37.1 C) (12/17 0600) Pulse Rate:  [63-107] 101 (12/17 1338) Resp:  [13-21] 17 (12/17 1338) BP: (115-149)/(64-90) 117/64 mmHg (12/17 1338) SpO2:  [86 %-100 %] 98 % (12/17 1338) Last BM Date: 11/04/15  Intake/Output from previous day: 12/16 0701 - 12/17 0700 In: 12.5 [IV Piggyback:12.5] Out: 550 [Urine:550] Intake/Output this shift: Total I/O In: 252.5 [P.O.:240; IV Piggyback:12.5] Out: -   Physical exam:  General: Resting in bed in no acute distress Chest: Clear to auscultation Heart: Tachycardic Abdomen: Soft, minimally tender to deep palpation in the lower midline, nondistended.  Lab Results: CBC   Recent Labs  11/04/15 1230 11/05/15 0521  WBC 12.2* 9.9  HGB 9.5* 8.7*  HCT 29.2* 26.5*  PLT 198 187   BMET  Recent Labs  11/04/15 1230 11/05/15 0521  NA 131* 134*  K 3.6 3.6  CL 94* 97*  CO2 26 29  GLUCOSE 111* 101*  BUN 17 12  CREATININE 0.61 0.74  CALCIUM 8.7* 8.5*   PT/INR No results for input(s): LABPROT, INR in the last 72 hours. ABG No results for input(s): PHART, HCO3 in the last 72 hours.  Invalid input(s): PCO2, PO2  Studies/Results: Ct Chest W Contrast  11/04/2015  CLINICAL DATA:  Vaginal discharge for 2 days. Newly diagnosed stage IIIC ovarian cancer. Prior surgery and ongoing chemotherapy. EXAM: CT CHEST, ABDOMEN, AND PELVIS WITH CONTRAST TECHNIQUE: Multidetector CT imaging of the chest, abdomen and pelvis was performed following the standard protocol during bolus administration of  intravenous contrast. CONTRAST:  89mL OMNIPAQUE IOHEXOL 300 MG/ML  SOLN COMPARISON:  07/04/2015 pelvic ultrasound.  No prior CT. FINDINGS: CT CHEST FINDINGS Mediastinum/Nodes: No supraclavicular adenopathy. A right-sided Port-A-Cath terminates in the mid right atrium. Aortic and branch vessel atherosclerosis. Tortuous descending thoracic aorta. Moderate cardiomegaly, without pericardial effusion. No central pulmonary embolism, on this non-dedicated study. Mild ductus diverticulum including on image 21/series 2. No mediastinal or hilar adenopathy. Lungs/Pleura: No pleural fluid. Mild diffuse nonspecific subpleural interstitial prominence. Left lower lobe subpleural pulmonary nodule measures 4 mm on image 37/series 4. Musculoskeletal: No acute osseous abnormality. CT ABDOMEN PELVIS FINDINGS Hepatobiliary: Focal steatosis adjacent the falciform ligament. No suspicious liver lesion. Normal gallbladder, without biliary ductal dilatation. Pancreas: Normal, without mass or ductal dilatation. A periampullary duodenal diverticulum is incidentally noted. Spleen: Normal in size, without focal abnormality. Adrenals/Urinary Tract: Normal adrenal glands. Too small to characterize right renal lesion. Lower pole right renal scarring. Normal left kidney, without hydronephrosis. Normal urinary bladder. Stomach/Bowel: Stool-filled sigmoid colon with small volume diverticula. No bowel obstruction. Normal terminal ileum. Normal small bowel. Vascular/Lymphatic: Aortic and branch vessel atherosclerosis. Circumaortic left renal vein. No retroperitoneal or retrocrural adenopathy. No pelvic sidewall adenopathy. Reproductive: Hysterectomy. 1.5 cm fluid density structure in the right hemipelvis is likely a residual follicle. Other: Fluid and gas collection within the pelvic cul-de-sac. This is intimately associated with the sigmoid colon and tracks towards the vaginal fornix. This measures on the order of 4.8 x 5.4 cm on image 100/series 2.  Concurrent air within the  vagina. Omental thickening and mild nodularity, including within the left side of the abdomen on image 67/series 2. 9 mm. Musculoskeletal: Degenerative disc disease at the lumbosacral junction. Convex right thoracolumbar spine curvature. IMPRESSION: 1. Gas and fluid collection within the pelvic cul-de-sac, consistent with abscess. Given the clinical history, and presence of air in the vagina, this likely communicates with the vagina. 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 2. Omental/peritoneal metastasis.  No abdominal pelvic adenopathy. 3. Nonspecific left lower lobe pulmonary nodule. Otherwise, no evidence of metastatic disease within the chest. 4.  Atherosclerosis, including within the coronary arteries. These results will be called to the ordering clinician or representative by the Radiologist Assistant, and communication documented in the PACS or zVision Dashboard. Electronically Signed   By: Abigail Miyamoto M.D.   On: 11/04/2015 12:03   Ct Abdomen Pelvis W Contrast  11/04/2015  CLINICAL DATA:  Vaginal discharge for 2 days. Newly diagnosed stage IIIC ovarian cancer. Prior surgery and ongoing chemotherapy. EXAM: CT CHEST, ABDOMEN, AND PELVIS WITH CONTRAST TECHNIQUE: Multidetector CT imaging of the chest, abdomen and pelvis was performed following the standard protocol during bolus administration of intravenous contrast. CONTRAST:  42mL OMNIPAQUE IOHEXOL 300 MG/ML  SOLN COMPARISON:  07/04/2015 pelvic ultrasound.  No prior CT. FINDINGS: CT CHEST FINDINGS Mediastinum/Nodes: No supraclavicular adenopathy. A right-sided Port-A-Cath terminates in the mid right atrium. Aortic and branch vessel atherosclerosis. Tortuous descending thoracic aorta. Moderate cardiomegaly, without pericardial effusion. No central pulmonary embolism, on this non-dedicated study. Mild ductus diverticulum including on image 21/series 2. No mediastinal or hilar  adenopathy. Lungs/Pleura: No pleural fluid. Mild diffuse nonspecific subpleural interstitial prominence. Left lower lobe subpleural pulmonary nodule measures 4 mm on image 37/series 4. Musculoskeletal: No acute osseous abnormality. CT ABDOMEN PELVIS FINDINGS Hepatobiliary: Focal steatosis adjacent the falciform ligament. No suspicious liver lesion. Normal gallbladder, without biliary ductal dilatation. Pancreas: Normal, without mass or ductal dilatation. A periampullary duodenal diverticulum is incidentally noted. Spleen: Normal in size, without focal abnormality. Adrenals/Urinary Tract: Normal adrenal glands. Too small to characterize right renal lesion. Lower pole right renal scarring. Normal left kidney, without hydronephrosis. Normal urinary bladder. Stomach/Bowel: Stool-filled sigmoid colon with small volume diverticula. No bowel obstruction. Normal terminal ileum. Normal small bowel. Vascular/Lymphatic: Aortic and branch vessel atherosclerosis. Circumaortic left renal vein. No retroperitoneal or retrocrural adenopathy. No pelvic sidewall adenopathy. Reproductive: Hysterectomy. 1.5 cm fluid density structure in the right hemipelvis is likely a residual follicle. Other: Fluid and gas collection within the pelvic cul-de-sac. This is intimately associated with the sigmoid colon and tracks towards the vaginal fornix. This measures on the order of 4.8 x 5.4 cm on image 100/series 2. Concurrent air within the vagina. Omental thickening and mild nodularity, including within the left side of the abdomen on image 67/series 2. 9 mm. Musculoskeletal: Degenerative disc disease at the lumbosacral junction. Convex right thoracolumbar spine curvature. IMPRESSION: 1. Gas and fluid collection within the pelvic cul-de-sac, consistent with abscess. Given the clinical history, and presence of air in the vagina, this likely communicates with the vagina. Given intimately associated shin with the sigmoid colon, colovaginal fistula is  a concern. 2. Omental/peritoneal metastasis.  No abdominal pelvic adenopathy. 3. Nonspecific left lower lobe pulmonary nodule. Otherwise, no evidence of metastatic disease within the chest. 4.  Atherosclerosis, including within the coronary arteries. These results will be called to the ordering clinician or representative by the Radiologist Assistant, and communication documented in the PACS or zVision Dashboard. Electronically Signed  By: Abigail Miyamoto M.D.   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 Has improved on antibiotics. White blood cell count has normalized. Plan remains for transfer to [facility] when bed is available. If that does not become available and patient were to worsen would evaluate for possible drainage here. Continue IV antibiotics, IV hydration, diet. Will order vaginal ultrasound for tomorrow or Monday if she is still here with drainage. If her symptoms continue to improve on IV antibiotics could possibly transitional oral antibiotics for continued outpatient care of this problem.  Kathryn Chandler T. Adonis Huguenin, MD, FACS  11/05/2015

## 2015-11-05 NOTE — Plan of Care (Signed)
Problem: Safety: Goal: Ability to remain free from injury will improve Outcome: Not Applicable Date Met:  [birth year] Txr to [facility]  Problem: Physical Regulation: Goal: Ability to maintain clinical measurements within normal limits will improve Outcome: Not Met (add Reason) txr to [facility] Goal: Will remain free from infection Outcome: Not Met (add Reason) txr to [facility]

## 2015-11-05 NOTE — Progress Notes (Signed)
Pt to be transferred to [facility] via [facility]. Pt is stable in no acute distress.  She notified her family by phone of transfer.  She is aware that she will be leaving around 4pm. Belongings with patient.

## 2015-11-07 ENCOUNTER — Inpatient Hospital Stay: Payer: Medicare Other

## 2015-11-07 ENCOUNTER — Inpatient Hospital Stay: Payer: Medicare Other | Admitting: Oncology

## 2015-11-07 DIAGNOSIS — E43 Unspecified severe protein-calorie malnutrition: Secondary | ICD-10-CM | POA: Insufficient documentation

## 2015-11-08 LAB — WOUND CULTURE

## 2015-11-30 ENCOUNTER — Other Ambulatory Visit: Payer: Self-pay | Admitting: *Deleted

## 2015-11-30 ENCOUNTER — Inpatient Hospital Stay: Payer: Medicare Other | Attending: Obstetrics and Gynecology

## 2016-01-11 HISTORY — PX: EXPLORATORY LAPAROTOMY W/ BOWEL RESECTION: SHX1544

## 2016-02-06 ENCOUNTER — Telehealth: Payer: Self-pay

## 2016-02-06 NOTE — Telephone Encounter (Signed)
  Oncology Nurse Navigator Documentation  Navigator Location: [facility] (02/06/16 1039)                                            Time Spent with Patient: 15 (02/06/16 1039)   Spoke with [name] on the phone. He voiced that Gyn appt was changed from 3/22 to 4/12 and that was ok. Gave him appt with Dr Oliva Bustard for 3/24 1130 labs and see Md 1145. Readback performed.

## 2016-02-06 NOTE — Telephone Encounter (Signed)
  Oncology Nurse Navigator Documentation                                                   error

## 2016-02-06 NOTE — Telephone Encounter (Signed)
  Oncology Nurse Navigator Documentation  Navigator Location: [facility] (02/06/16 1000) Navigator Encounter Type: Telephone (02/06/16 1000) Telephone: Lahoma Crocker Call;Appt Confirmation/Clarification (02/06/16 1000)         Patient Visit Type: Post-op Appt (02/06/16 1000)   Barriers/Navigation Needs: Coordination of Care (02/06/16 1000)   Interventions: Coordination of Care (02/06/16 1000)   Coordination of Care: Appts (02/06/16 1000)        Acuity: Level 1 (02/06/16 1000) Acuity Level 1: Minimal follow up required (02/06/16 1000)       Time Spent with Patient: 30 (02/06/16 1000)   Received message from fellow at [facility] regarding follow up appts. Appt arranged for Dr Oliva Bustard 3/24 Friday at 11:45. [facility] notified and son left a voicemail regarding. She has follow up with Gyn 4/12 as present. If she needs to be seen sooner if will require Dr Theora Gianotti approval. Message sent to fellow regarding this.

## 2016-02-06 NOTE — Telephone Encounter (Signed)
  Oncology Nurse Navigator Documentation  Navigator Location: [facility] (02/06/16 1600)                         Coordination of Care: Appts (02/06/16 1600)                  Time Spent with Patient: 15 (02/06/16 1600)   Spoke with [name] regarding ability to bring Kathryn Chandler to an appt Wed with Dr Theora Gianotti. He states he cannot at this late notice unless it would be 4p or after and that is pushing it. He asked to to call her directly at [facility]. I called her and she states maybe her neighbors can bring her after she is discharged from [facility] on Wed. She states she will need to call me back tomorrow. She did not have anything to take my number down on.

## 2016-02-07 ENCOUNTER — Telehealth: Payer: Self-pay

## 2016-02-07 NOTE — Telephone Encounter (Signed)
  Oncology Nurse Navigator Documentation  Navigator Location: [facility] (02/07/16 1500)                         Coordination of Care: Appts (02/07/16 1500)                  Time Spent with Patient: 15 (02/07/16 1500)   Spoke with Kathryn Chandler again on the phone. She states she cannot come to an appt tomorrow 3/22. She is not sure what time she is leaving the rehab and at her age she cannot run all over. She states she has her appt for April and she will keep that one for surgery follow up with Dr Theora Gianotti. She is aware of her appt Friday with Dr Oliva Bustard

## 2016-02-08 ENCOUNTER — Ambulatory Visit: Payer: Medicare Other

## 2016-02-10 ENCOUNTER — Inpatient Hospital Stay: Payer: Medicare Other | Attending: Oncology | Admitting: Oncology

## 2016-02-10 ENCOUNTER — Inpatient Hospital Stay: Payer: Medicare Other

## 2016-02-10 VITALS — BP 114/78 | HR 118 | Temp 97.0°F | Resp 18 | Wt 113.5 lb

## 2016-02-10 DIAGNOSIS — C569 Malignant neoplasm of unspecified ovary: Secondary | ICD-10-CM | POA: Diagnosis not present

## 2016-02-10 DIAGNOSIS — R531 Weakness: Secondary | ICD-10-CM | POA: Diagnosis not present

## 2016-02-10 DIAGNOSIS — I10 Essential (primary) hypertension: Secondary | ICD-10-CM | POA: Insufficient documentation

## 2016-02-10 DIAGNOSIS — R5383 Other fatigue: Secondary | ICD-10-CM | POA: Insufficient documentation

## 2016-02-10 DIAGNOSIS — K219 Gastro-esophageal reflux disease without esophagitis: Secondary | ICD-10-CM | POA: Insufficient documentation

## 2016-02-10 DIAGNOSIS — Z79899 Other long term (current) drug therapy: Secondary | ICD-10-CM | POA: Diagnosis not present

## 2016-02-10 DIAGNOSIS — M818 Other osteoporosis without current pathological fracture: Secondary | ICD-10-CM | POA: Diagnosis not present

## 2016-02-10 DIAGNOSIS — E440 Moderate protein-calorie malnutrition: Secondary | ICD-10-CM | POA: Insufficient documentation

## 2016-02-10 DIAGNOSIS — E7800 Pure hypercholesterolemia, unspecified: Secondary | ICD-10-CM | POA: Insufficient documentation

## 2016-02-10 DIAGNOSIS — F419 Anxiety disorder, unspecified: Secondary | ICD-10-CM | POA: Insufficient documentation

## 2016-02-10 LAB — COMPREHENSIVE METABOLIC PANEL
ALT: 15 U/L (ref 14–54)
ANION GAP: 11 (ref 5–15)
AST: 25 U/L (ref 15–41)
Albumin: 4.2 g/dL (ref 3.5–5.0)
Alkaline Phosphatase: 53 U/L (ref 38–126)
BUN: 15 mg/dL (ref 6–20)
CHLORIDE: 97 mmol/L — AB (ref 101–111)
CO2: 26 mmol/L (ref 22–32)
Calcium: 9.2 mg/dL (ref 8.9–10.3)
Creatinine, Ser: 0.7 mg/dL (ref 0.44–1.00)
GFR calc non Af Amer: >60 mL/min (ref >60)
Glucose, Bld: 113 mg/dL — ABNORMAL HIGH (ref 65–99)
POTASSIUM: 3.6 mmol/L (ref 3.5–5.1)
SODIUM: 134 mmol/L — AB (ref 135–145)
Total Bilirubin: 0.7 mg/dL (ref 0.3–1.2)
Total Protein: 7.8 g/dL (ref 6.5–8.1)

## 2016-02-10 LAB — MAGNESIUM: Magnesium: 1.7 mg/dL (ref 1.7–2.4)

## 2016-02-10 NOTE — Progress Notes (Signed)
Patient here today post surgery/rehabilitation.  Appetite poor, however, she is drinking protein drinks.

## 2016-02-11 ENCOUNTER — Encounter: Payer: Self-pay | Admitting: Oncology

## 2016-02-11 LAB — CA 125: CA 125: 7.5 U/mL (ref 0.0–38.1)

## 2016-02-11 NOTE — Progress Notes (Signed)
Kathryn Chandler [phone]  Fax:([phone]) [facility]: [birth year]  MR#: [id number]  BHA#:[id number]  Patient Care Team: Albina Billet, MD as PCP - General (Internal Medicine) [facility] Gaetana Michaelis, MD as Referring Physician (Obstetrics and Gynecology) Maceo Pro, MD as Referring Physician (Obstetrics and Gynecology)  CHIEF COMPLAINT: (oncology history)  Carcinoma of ovary status post exploratory laparotomy and biopsy Extensive disease stage IIIc T IIIcNX M0 tumor Suboptimal debulking (September, 2016) . BRCA mutation was negative Rectovaginal fistula status for status post optimal debulking, resection of rectosigmoid and repair of fistula in view [facility] in February of 2014) . VISIT DIAGNOSIS:  Ovarian cancer   ICD-9-CM ICD-10-CM   1. Ovarian cancer, unspecified laterality ([place]) 183.0 X73.5 Basic metabolic panel      No history exists.  Interval history Patient had developed rectovaginal fistula in December of 2015.  Patient was admitted in hospital at [facility].  Initially treated conservatively followed by further evaluation patient underwent     : XL, optimal secondary debulking, rectosigmoid resection and reanastomosis with repair of rectovaginal fistula, RSO  Has  protein caloric malnutrition diarrhea anemia was transferred to rehabilitation gradually patient is recovering.  Feeling somewhat stronger.  Here for further evaluation and treatment consideration. Also has an appointment to see Dr. Kasandra Knudsen date, his primary care physician   Review of system  general status: Patient is feeling weak and tired.  No change in a performance status.  No chills.  No fever. Gradually recovering from multiple hospital admission at [facility] for following problems were addressed. Reason for Hospitalization Kathryn Chandler is a 82 y.o. female who was admitted for the above mentioned surgery.  Problems  Addressed During Hospitalization: Principal Problem: Ovarian cancer, unspecified laterality Active Problems: Osteoporosis Acid reflux Essential hypertension Protein-calorie malnutrition, moderate (CMS-HCC) Colovaginal fistula Resolved Problems: Abscess of female pelvis Hypocalcemia Postoperative anemia due to acute blood loss Diarrhea, unspecified  She is gradually recovering feeling stronger was in rehabilitation diarrhea has resolved. abdomnal   wound is healing well.   PAST MEDICAL HISTORY: Past Medical History  Diagnosis Date  . Glaucoma   . GERD (gastroesophageal reflux disease)   . Hypercholesteremia   . PONV (postoperative nausea and vomiting)   . Hypertension   . Anxiety   . Cancer [facility])     PAST SURGICAL HISTORY: Past Surgical History  Procedure Laterality Date  . Abdominal hysterectomy    . Replacement total knee      Right  . Breast biopsy    . Hand surgery      Right  . Laparotomy Left 07/20/2015    Procedure: LAPAROTOMY with Left Salpingooophrectomy;  Surgeon: Honor Loh Ward, MD;  Location: [facility] ORS;  Service: Gynecology;  Laterality: Left;  . Laparotomy N/A 07/20/2015    Procedure: LAPAROTOMY;  Surgeon: Gillis Ends, MD;  Location: [facility] ORS;  Service: Gynecology;  Laterality: N/A;  . Salpingoophorectomy Right 07/20/2015    Procedure: SALPINGO OOPHORECTOMY;  Surgeon: Gillis Ends, MD;  Location: [facility] ORS;  Service: Gynecology;  Laterality: Right;  . Peripheral vascular catheterization N/A 08/10/2015    Procedure: Glori Luis Cath Insertion;  Surgeon: Algernon Huxley, MD;  Location: [place] [facility];  Service: Cardiovascular;  Laterality: N/A;    FAMILY HISTORY No family history on file. There is no significant family history of breast cancer, ovarian cancer, colon cancer       ADVANCED DIRECTIVES:  Patient does  have advance healthcare directive, Patient   does not desire to make any changes  HEALTH MAINTENANCE: Social History    Substance Use Topics  . Smoking status: Never Smoker   . Smokeless tobacco: Never Used  . Alcohol Use: No      Allergies  Allergen Reactions  . Clindamycin/Lincomycin Diarrhea and Other (See Comments)    Reaction:  Antibiotic colitis   . Codeine Anxiety    Current Outpatient Prescriptions  Medication Sig Dispense Refill  . acetaminophen (TYLENOL) 500 MG tablet Take 500-1,000 mg by mouth every 6 (six) hours as needed for mild pain.     . bimatoprost (LUMIGAN) 0.01 % SOLN Place 1 drop into both eyes at bedtime.    . fluticasone (FLONASE) 50 MCG/ACT nasal spray Place 2 sprays into both nostrils daily as needed for rhinitis.     Marland Kitchen ibuprofen (ADVIL,MOTRIN) 200 MG tablet Take 400 mg by mouth every 6 (six) hours as needed for mild pain.    Marland Kitchen lidocaine-prilocaine (EMLA) cream Apply 1 application topically as needed. (Patient taking differently: Apply 1 application topically as needed (prior to accessing port). ) 30 g 3  . LORazepam (ATIVAN) 0.5 MG tablet Take 0.5 mg by mouth 2 (two) times daily.     . meloxicam (MOBIC) 15 MG tablet Take 15 mg by mouth daily.     Marland Kitchen menthol-cetylpyridinium (CEPACOL) 3 MG lozenge Take 1 lozenge (3 mg total) by mouth every 2 (two) hours as needed for sore throat. 100 tablet 12  . omeprazole (PRILOSEC) 20 MG capsule Take 20 mg by mouth daily.     . ondansetron (ZOFRAN) 4 MG tablet Take 1 tablet (4 mg total) by mouth every 6 (six) hours as needed for nausea. 30 tablet 3  . simvastatin (ZOCOR) 20 MG tablet Take 20 mg by mouth at bedtime.     . traMADol (ULTRAM) 50 MG tablet Take 1 tablet (50 mg total) by mouth every 6 (six) hours. 65 tablet 0  . vitamin C (ASCORBIC ACID) 500 MG tablet Take 500 mg by mouth daily.     No current facility-administered medications for this visit.   Facility-Administered Medications Ordered in Other Visits  Medication Dose Route Frequency Provider Last Rate Last Dose  . diphenhydrAMINE (BENADRYL) injection 12.5 mg  12.5 mg  Intravenous Once Forest Gleason, MD      . methylPREDNISolone sodium succinate (SOLU-MEDROL) 125 mg/2 mL injection 100 mg  100 mg Intravenous Once Forest Gleason, MD        OBJECTIVE: PHYSICAL EXAM: Gen. status: Patient is alert oriented not any acute distress.. Lymphatic system: Supraclavicular, cervical, axillary, inguinal lymph nodes are not palpable Cardiac exam revealed the PMI to be normally situated and sized. The rhythm was regular and no extrasystoles were noted during several minutes of auscultation. The first and second heart sounds were normal and physiologic splitting of the second heart sound was noted. There were no murmurs, rubs, clicks, or gallops. Examination of the chest was unremarkable. There were no bony deformities, no asymmetry, and no other abnormalities. Abdomen: Soft no ascites no palpable masses.  Abdominal wound has healed. Examination of the skin revealed no evidence of significant rashes, suspicious appearing nevi or other concerning lesions. Neurologically, the patient was awake, alert, and oriented to person, place and time. There were no obvious focal neurologic abnormalities.  Lower extremity no swelling Musculoskeletal system within normal limit   Filed Vitals:   02/10/16 1211  BP: 114/78  Pulse: 118  Temp: 97 F (  36.1 C)  Resp: 18     Body mass index is 22.91 kg/(m^2).    ECOG FS:1 - Symptomatic but completely ambulatory  LAB RESULTS:  Appointment on 02/10/2016  Component Date Value Ref Range Status  . Sodium 02/10/2016 134* 135 - 145 mmol/L Final  . Potassium 02/10/2016 3.6  3.5 - 5.1 mmol/L Final  . Chloride 02/10/2016 97* 101 - 111 mmol/L Final  . CO2 02/10/2016 26  22 - 32 mmol/L Final  . Glucose, Bld 02/10/2016 113* 65 - 99 mg/dL Final  . BUN 02/10/2016 15  6 - 20 mg/dL Final  . Creatinine, Ser 02/10/2016 0.70  0.44 - 1.00 mg/dL Final  . Calcium 02/10/2016 9.2  8.9 - 10.3 mg/dL Final  . Total Protein 02/10/2016 7.8  6.5 - 8.1 g/dL Final  .  Albumin 02/10/2016 4.2  3.5 - 5.0 g/dL Final  . AST 02/10/2016 25  15 - 41 U/L Final  . ALT 02/10/2016 15  14 - 54 U/L Final  . Alkaline Phosphatase 02/10/2016 53  38 - 126 U/L Final  . Total Bilirubin 02/10/2016 0.7  0.3 - 1.2 mg/dL Final  . GFR calc non Af Amer 02/10/2016 >60  >60 mL/min Final  . GFR calc Af Amer 02/10/2016 >60  >60 mL/min Final   Comment: (NOTE) The eGFR has been calculated using the CKD EPI equation. This calculation has not been validated in all clinical situations. eGFR's persistently <60 mL/min signify possible Chronic Kidney Disease.   . Anion gap 02/10/2016 11  5 - 15 Final  . Magnesium 02/10/2016 1.7  1.7 - 2.4 mg/dL Final  . CA 125 02/10/2016 7.5  0.0 - 38.1 U/mL Final   Comment: (NOTE) Roche ECLIA methodology Performed At: [facility] [address] [place], [place] [id number] Lindon Romp MD ZM:[id number]    Pathologpathology report from [facility] in (February, 2017)  [place]  Component Name Value Range  Case Report Surgical Pathology                                Case: [id number]                                Authorizing Provider:  Gillis Ends, MD Collected:           01/11/2016 0957             Ordering Location:     [facility]          Received:            01/11/2016 1618             Pathologist:           Lelon Frohlich, MD                                                     Specimens:   A) - Peritoneum, anterior peritoneal biopsy  B) - Ovary, Right, Right fallopian tube and ovary                                                  C) - Omentum, omental cake                                                                         D) - Large Intestine, NOS, Rectosigmoid                                                            E) - Other, Proximal Distal donut                                                       DIAGNOSIS A.  Anterior peritoneum, biopsy:  High grade serous adenocarcinoma.     B. Right ovary and fallopian tube, salpingo-oophorectomy:  Ovary with residual high grade serous adenocarcinoma.  Fallopian tube: Negative for malignancy.    C. Omental cake, biopsy:  High grade serous adenocarcinoma.   D. Rectosigmoid colon, excision:  Benign colonic tissue with diverticula.    E. Proximal distal donut, excision:  Benign colonic tissue.    Synoptic Report OVARY or FALLOPIAN TUBE: Oophorectomy, Salpingectomy, Salpingo-Oophorectomy, Subtotal Oophorectomy or Removal of Tumor in Fragments, Hysterectomy With Salpingo-Oophorectomy or Salpingectomy  (Ovary FT - All Specimens)   SPECIMEN    Procedure:    Right salpingo-oophorectomy    Procedure:    Omentectomy    Lymph Node Sampling:    Not performed    Specimen Integrity:    Right ovary      Specimen Integrity of Right Ovary:    Capsule intact    Specimen Integrity:    Right fallopian tube      Specimen Integrity of Right Fallopian Tube:    Intact  TUMOR    Primary Tumor Site(s):    Not specified    Histologic Type:    Serous high-grade carcinoma      Ovarian Surface Involvement:    Present      Noninvasive Implant(s):    Not applicable      Specimen(s):    Right ovary        Right Ovary - Extent:    Involved      Specimen(s):    Right fallopian tube        Right Fallopian Tube - Extent:    Not involved      Specimen(s):    Omentum        Omentum - Extent:    Involved      Peritoneal Ascitic Fluid:    Not performed / unknown      Pleural Fluid:    Not performed /  unknown      Treatment Effect:    Moderate response identified (CRS score 2)  LYMPH NODES    Regional Lymph Nodes:    No lymph nodes submitted or found    Lymph Node Involvement:    Not applicable  STAGE (pTNM)    TNM Descriptors:    y (post-treatment)    Primary Tumor (pT):    Ovary      Ovarian Primary Tumor (pT):    pT3c and / or N1: Peritoneal metastasis beyond pelvis  > 2 cm in greatest dimension and / or regional lymph node metastasis    Regional Lymph Nodes (pN):    pNX: Cannot be assessed    Distant Metastasis (pM):    Not applicable - pM cannot be determined        ASSESSMENT:  had a significant response to the chemotherapy. Rectovaginal fistula secondary to chemotherapy any Avastin therapy which is resolved.  Has undergone optimal debulking.  (pT3C) Here for further evaluation and treatment consideration I would like to wait for next few weeks for patient to get stronger and improve before any further consideration of treatment is being planned.  We will also order somatic mutation , HR D BRCA and BRCA2 by   my   choice from [facility].  She has an appointment to see Dr. Theora Gianotti on April 12 and will be evaluated by me on April 19 for consideration of further therapy.  Assessment;   discussed with the patient.  Extensive records from [facility] [place] has been reviewed.  Pathology report has been reviewed.  Patient expressed understanding and was in agreement with this plan. She also understands that She can call clinic at any time with any questions, concerns, or complaints.    No matching staging information was found for the patient.  Forest Gleason, MD   02/11/2016 8:34 AM

## 2016-02-29 ENCOUNTER — Inpatient Hospital Stay: Payer: Medicare Other | Attending: Obstetrics and Gynecology | Admitting: Obstetrics and Gynecology

## 2016-02-29 ENCOUNTER — Encounter: Payer: Self-pay | Admitting: Obstetrics and Gynecology

## 2016-02-29 ENCOUNTER — Inpatient Hospital Stay: Payer: Medicare Other

## 2016-02-29 VITALS — BP 123/80 | HR 91 | Temp 97.8°F | Resp 20 | Wt 117.1 lb

## 2016-02-29 DIAGNOSIS — M818 Other osteoporosis without current pathological fracture: Secondary | ICD-10-CM | POA: Diagnosis not present

## 2016-02-29 DIAGNOSIS — E7800 Pure hypercholesterolemia, unspecified: Secondary | ICD-10-CM | POA: Diagnosis not present

## 2016-02-29 DIAGNOSIS — K219 Gastro-esophageal reflux disease without esophagitis: Secondary | ICD-10-CM | POA: Insufficient documentation

## 2016-02-29 DIAGNOSIS — C569 Malignant neoplasm of unspecified ovary: Secondary | ICD-10-CM

## 2016-02-29 DIAGNOSIS — I10 Essential (primary) hypertension: Secondary | ICD-10-CM | POA: Diagnosis not present

## 2016-02-29 DIAGNOSIS — K603 Anal fistula: Secondary | ICD-10-CM | POA: Insufficient documentation

## 2016-02-29 DIAGNOSIS — E440 Moderate protein-calorie malnutrition: Secondary | ICD-10-CM | POA: Insufficient documentation

## 2016-02-29 DIAGNOSIS — Z1502 Genetic susceptibility to malignant neoplasm of ovary: Secondary | ICD-10-CM | POA: Diagnosis not present

## 2016-02-29 DIAGNOSIS — F418 Other specified anxiety disorders: Secondary | ICD-10-CM | POA: Insufficient documentation

## 2016-02-29 DIAGNOSIS — Z79899 Other long term (current) drug therapy: Secondary | ICD-10-CM | POA: Diagnosis not present

## 2016-02-29 LAB — CBC WITH DIFFERENTIAL/PLATELET
BASOS ABS: 0.1 10*3/uL (ref 0–0.1)
Basophils Relative: 1 %
EOS PCT: 1 %
Eosinophils Absolute: 0.1 10*3/uL (ref 0–0.7)
HEMATOCRIT: 37.9 % (ref 35.0–47.0)
Hemoglobin: 12.8 g/dL (ref 12.0–16.0)
LYMPHS ABS: 2.9 10*3/uL (ref 1.0–3.6)
LYMPHS PCT: 31 %
MCH: 30.4 pg (ref 26.0–34.0)
MCHC: 33.9 g/dL (ref 32.0–36.0)
MCV: 89.8 fL (ref 80.0–100.0)
MONO ABS: 1 10*3/uL — AB (ref 0.2–0.9)
MONOS PCT: 11 %
NEUTROS ABS: 5.2 10*3/uL (ref 1.4–6.5)
Neutrophils Relative %: 56 %
Platelets: 204 10*3/uL (ref 150–440)
RBC: 4.22 MIL/uL (ref 3.80–5.20)
RDW: 17.3 % — AB (ref 11.5–14.5)
WBC: 9.2 10*3/uL (ref 3.6–11.0)

## 2016-02-29 LAB — BASIC METABOLIC PANEL
ANION GAP: 6 (ref 5–15)
BUN: 20 mg/dL (ref 6–20)
CHLORIDE: 101 mmol/L (ref 101–111)
CO2: 28 mmol/L (ref 22–32)
Calcium: 9.1 mg/dL (ref 8.9–10.3)
Creatinine, Ser: 0.75 mg/dL (ref 0.44–1.00)
GFR calc non Af Amer: >60 mL/min (ref >60)
Glucose, Bld: 117 mg/dL — ABNORMAL HIGH (ref 65–99)
POTASSIUM: 3.5 mmol/L (ref 3.5–5.1)
Sodium: 135 mmol/L (ref 135–145)

## 2016-02-29 NOTE — Progress Notes (Signed)
Patient here today for follow up regarding ovarian cancer. Patient denies concerns today.

## 2016-02-29 NOTE — Progress Notes (Signed)
  Oncology Nurse Navigator Documentation  Navigator Location: [facility] (02/29/16 1100) Navigator Encounter Type: Follow-up Appt (02/29/16 1100)             Treatment Phase: Follow-up (02/29/16 1100)                            Time Spent with Patient: 15 (02/29/16 1100)   Chaperoned pelvic exam. [name]) having concerns regarding [place] billing process. Would like to speak to someone as"high up" as possible. Provided him with Freida Busman Singleton's phone number to assist with addressing his concerns.

## 2016-02-29 NOTE — Progress Notes (Signed)
Gynecologic Oncology Interval Visit   Referring Provider: Dr. Hall Busing  Chief Concern: postoperative visit, stage IIIC ovarian cancer, history or rectovaginal fistula s/p rectosigmoid resection and reanastomosis with optimal interval tumor debulking.   Subjective:  Kathryn [facility] is a 82 y.o. female who is seen initially in consultation from Dr. Hall Busing with suboptimally debulked stage IIIC HGSC of the ovary undergoing chemotherapy with Dr. Oliva Bustard.    She developed a rectovaginal fistula attributed to bevacizumab therapy. She  status for status post XL on 01/11/2016 with RSO, enterolysis, infragastric omentectomy, mobilization of splenic flexure,  resection of rectosigmoid and repair of fistula, with optimal debulking to < 3 mm. Residual disease is miliary in nature involving small bowel mesentary. Postoperatively she is doing well. She is having BMs but they are more narrow in caliber. She is tolerating oral intake and using 1-2 nutritional supplements a day.   Pathology results:  A. Anterior peritoneum, biopsy:  High grade serous adenocarcinoma.     B. Right ovary and fallopian tube, salpingo-oophorectomy:  Ovary with residual high grade serous adenocarcinoma.  Fallopian tube: Negative for malignancy.    C. Omental cake, biopsy:  High grade serous adenocarcinoma.   D. Rectosigmoid colon, excision:  Benign colonic tissue with diverticula.    E. Proximal distal donut, excision:  Benign colonic tissue.     Lab Results  Component Value Date   CA125 7.5 02/10/2016   CA125 10.2 10/03/2015   CA125 84.1* 08/03/2015    Gynecologic Oncology History   Kathryn Chandler is a pleasant female who is seen initially in consultation from Dr. Hall Busing with suboptimally debulked stage IIIC HGSC. She underwent laparotomy and LSO with resection of pelvic mass on 07/20/2015 and has stage IIIC ovarian cancer. Her disease was not amendable to debulking to no gross residual. She would have required  several bowel resections, diaphragm stripping, and liver mobilization and due to peritoneal carcinomatosis would not have been debulked to R0. Preoperative CA125 74.2 and HE4=118  FINAL PATHOLOGY DIAGNOSIS:  A. LEFT OVARY; OOPHORECTOMY:  - HIGH-GRADE SEROUS CARCINOMA.  - FALLOPIAN TUBE NOT IDENTIFIED.  She has been treated with weekly taxane and carboplatin (initially paclitaxel but was switched to Abraxane on day 1 and 8 and 15). Bevacizumab was added cycles 2 and 3. She is tolerating her therapy with an excellent decrease in CA125 values.      Genetic testing BRCA mutation was negative   Problem List: Patient Active Problem List   Diagnosis Date Noted  . Severe protein-calorie malnutrition Altamease Oiler: less than 60% of standard weight) ([place]) 11/07/2015  . Essential (primary) hypertension 11/05/2015  . Malignant neoplasm of ovary ([place]) 11/05/2015  . Colovaginal fistula 11/04/2015  . Acid reflux 08/03/2015  . Bilateral cataracts 08/03/2015  . Chicken pox 08/03/2015  . Allergic state 08/03/2015  . Breast lump 08/03/2015  . Hypercholesteremia 08/03/2015  . Malignant melanoma ([place]) 08/03/2015  . Ovarian cancer ([place]) 07/20/2015  . Head or neck swelling, mass, or lump 08/17/2014    Past Medical History: Past Medical History  Diagnosis Date  . Glaucoma   . GERD (gastroesophageal reflux disease)   . Hypercholesteremia   . PONV (postoperative nausea and vomiting)   . Hypertension   . Anxiety   . Cancer [facility])     Past Surgical History: Past Surgical History  Procedure Laterality Date  . Abdominal hysterectomy    . Replacement total knee      Right  . Breast biopsy    . Hand surgery  Right  . Laparotomy Left 07/20/2015    Procedure: LAPAROTOMY with Left Salpingooophrectomy;  Surgeon: Honor Loh Ward, MD;  Location: [facility] ORS;  Service: Gynecology;  Laterality: Left;  . Laparotomy N/A 07/20/2015    Procedure: LAPAROTOMY;  Surgeon: Gillis Ends, MD;  Location: [facility]  ORS;  Service: Gynecology;  Laterality: N/A;  . Salpingoophorectomy Right 07/20/2015    Procedure: SALPINGO OOPHORECTOMY;  Surgeon: Gillis Ends, MD;  Location: [facility] ORS;  Service: Gynecology;  Laterality: Right;  . Peripheral vascular catheterization N/A 08/10/2015    Procedure: Glori Luis Cath Insertion;  Surgeon: Algernon Huxley, MD;  Location: [place] [facility];  Service: Cardiovascular;  Laterality: N/A;    Past Gynecologic History:  Menopause: 1976 06/07/2014 Mammogram IMPRESSION: No mammographic evidence of malignancy. A result letter of this screening mammogram will be mailed directly to the patient.   OB History:  OB History  Gravida Para Term Preterm AB SAB TAB Ectopic Multiple Living  3 1   2 2         # Outcome Date GA Lbr Len/2nd Weight Sex Delivery Anes PTL Lv  3 Para           2 SAB           1 SAB               Family History: History reviewed. No pertinent family history.  Social History: Social History   Social History  . Marital Status: Widowed    Spouse Name: N/A  . Number of Children: N/A  . Years of Education: N/A   Occupational History  .      Retired   Social History Main Topics  . Smoking status: Never Smoker   . Smokeless tobacco: Never Used  . Alcohol Use: No  . Drug Use: No  . Sexual Activity: Not on file   Other Topics Concern  . Not on file   Social History Narrative    Allergies: Allergies  Allergen Reactions  . Clindamycin/Lincomycin Diarrhea and Other (See Comments)    Reaction:  Antibiotic colitis   . Codeine Anxiety    Current Medications: Current Outpatient Prescriptions  Medication Sig Dispense Refill  . acetaminophen (TYLENOL) 500 MG tablet Take 500-1,000 mg by mouth every 6 (six) hours as needed for mild pain.     . bimatoprost (LUMIGAN) 0.01 % SOLN Place 1 drop into both eyes at bedtime.    . fluticasone (FLONASE) 50 MCG/ACT nasal spray Place 2 sprays into both nostrils daily as needed for rhinitis.     Marland Kitchen  ibuprofen (ADVIL,MOTRIN) 200 MG tablet Take 400 mg by mouth every 6 (six) hours as needed for mild pain.    Marland Kitchen lidocaine-prilocaine (EMLA) cream Apply 1 application topically as needed. (Patient taking differently: Apply 1 application topically as needed (prior to accessing port). ) 30 g 3  . LORazepam (ATIVAN) 0.5 MG tablet Take 0.5 mg by mouth 2 (two) times daily.     . meloxicam (MOBIC) 15 MG tablet Take 15 mg by mouth daily.     Marland Kitchen menthol-cetylpyridinium (CEPACOL) 3 MG lozenge Take 1 lozenge (3 mg total) by mouth every 2 (two) hours as needed for sore throat. 100 tablet 12  . omeprazole (PRILOSEC) 20 MG capsule Take 20 mg by mouth daily.     . ondansetron (ZOFRAN) 4 MG tablet Take 1 tablet (4 mg total) by mouth every 6 (six) hours as needed for nausea. 30 tablet 3  . simvastatin (ZOCOR) 20  MG tablet Take 20 mg by mouth at bedtime.     . traMADol (ULTRAM) 50 MG tablet Take 1 tablet (50 mg total) by mouth every 6 (six) hours. 65 tablet 0  . vitamin C (ASCORBIC ACID) 500 MG tablet Take 500 mg by mouth daily.     No current facility-administered medications for this visit.   Facility-Administered Medications Ordered in Other Visits  Medication Dose Route Frequency Provider Last Rate Last Dose  . diphenhydrAMINE (BENADRYL) injection 12.5 mg  12.5 mg Intravenous Once Forest Gleason, MD      . methylPREDNISolone sodium succinate (SOLU-MEDROL) 125 mg/2 mL injection 100 mg  100 mg Intravenous Once Forest Gleason, MD        Review of Systems General: negative  HEENT: no complaints  Lungs: no complaints  Cardiac: no complaints  GI: no complaints  GU: no complaints  Musculoskeletal: no complaints  Extremities: no complaints  Skin: wound is healed  Neuro: no complaints  Endocrine: no complaints  Psych: no complaints      Objective:  Physical Examination:  BP 123/80 mmHg  Pulse 91  Temp(Src) 97.8 F (36.6 C) (Tympanic)  Resp 20  Wt 117 lb 1 oz (53.1 kg)  Body mass index is 23.63  kg/(m^2). BMI increased compared to 22.91 on 02/11/2016; baseline BMI 24.83 on 10/26/2015   ECOG Performance Status: 3 - Symptomatic, >50% confined to bed  General appearance: alert, cooperative and appears stated age HEENT:PERRLA, extra ocular movement intact and sclera clear, anicteric CV: RRR Lungs: BCTA  Lymphatic survey: Axillary, [place], and inguinal nodes negative. Abdomen: soft, nontender and nondistended. No palpable masses or ascites, no hernias and well healed incision  Neurological exam reveals alert, oriented, normal speech, no focal findings or movement disorder noted. Extremities: Negative for edema or cords Neuro: Grossly intact  Pelvic: exam chaperoned by nurse;  Vulva: normal appearing vulva with no masses, tenderness or lesions; Vagina: normal vagina with healing at the cuff; Adnexa: surgically absent; Uterus: surgically absent, vaginal cuff well healed; Cervix: absent; Rectal: confirmatory, no mass; staple line not palpated.       Assessment:  Kathryn [facility] is a 82 y.o. female diagnosed with stage IIIc high grade serous ovarian cancer s/p interval optiomally debulking with rectosigmoid resection and reanastomosis for rectovaginal fistula. S/p chemotherapy with weekly abraxane, carboplatin, and bevacizumab.  Medical co-morbidities complicating care: prior abdominal surgery, frailty, performance status of 3.  Plan:   Problem List Items Addressed This Visit      Genitourinary   Ovarian cancer ([place]) - Primary   Relevant Orders   CA 125      We discussed options for management. I have recommended restarting chemotherapy, but no more bevacizumab.   We discussed why she has change in size of stool caliber which is secondary to the surgery. She is off of dietary restrictions. I recommended increasing the supplements.   We will continue to follow closely with repeat visit in 3 months and CA125. She should be almost done or completed her chemotherapy at that time.      Gillis Ends, MD    CC:  Larey Days, MD  Albina Billet, MD [address]   [place], [place] [address] [phone]

## 2016-03-06 ENCOUNTER — Inpatient Hospital Stay: Payer: Medicare Other

## 2016-03-06 ENCOUNTER — Inpatient Hospital Stay (HOSPITAL_BASED_OUTPATIENT_CLINIC_OR_DEPARTMENT_OTHER): Payer: Medicare Other | Admitting: Oncology

## 2016-03-06 VITALS — BP 115/78 | HR 89 | Temp 96.6°F | Resp 18 | Wt 113.8 lb

## 2016-03-06 DIAGNOSIS — C569 Malignant neoplasm of unspecified ovary: Secondary | ICD-10-CM

## 2016-03-06 DIAGNOSIS — E440 Moderate protein-calorie malnutrition: Secondary | ICD-10-CM

## 2016-03-06 DIAGNOSIS — I10 Essential (primary) hypertension: Secondary | ICD-10-CM

## 2016-03-06 DIAGNOSIS — Z1502 Genetic susceptibility to malignant neoplasm of ovary: Secondary | ICD-10-CM

## 2016-03-06 DIAGNOSIS — M818 Other osteoporosis without current pathological fracture: Secondary | ICD-10-CM | POA: Diagnosis not present

## 2016-03-06 DIAGNOSIS — F418 Other specified anxiety disorders: Secondary | ICD-10-CM

## 2016-03-06 DIAGNOSIS — K219 Gastro-esophageal reflux disease without esophagitis: Secondary | ICD-10-CM | POA: Diagnosis not present

## 2016-03-06 DIAGNOSIS — E7800 Pure hypercholesterolemia, unspecified: Secondary | ICD-10-CM

## 2016-03-06 DIAGNOSIS — K603 Anal fistula: Secondary | ICD-10-CM | POA: Diagnosis not present

## 2016-03-06 DIAGNOSIS — Z79899 Other long term (current) drug therapy: Secondary | ICD-10-CM

## 2016-03-06 LAB — COMPREHENSIVE METABOLIC PANEL
ALK PHOS: 59 U/L (ref 38–126)
ALT: 16 U/L (ref 14–54)
AST: 25 U/L (ref 15–41)
Albumin: 4.4 g/dL (ref 3.5–5.0)
Anion gap: 7 (ref 5–15)
BILIRUBIN TOTAL: 0.8 mg/dL (ref 0.3–1.2)
BUN: 24 mg/dL — ABNORMAL HIGH (ref 6–20)
CALCIUM: 9.5 mg/dL (ref 8.9–10.3)
CO2: 29 mmol/L (ref 22–32)
CREATININE: 0.75 mg/dL (ref 0.44–1.00)
Chloride: 102 mmol/L (ref 101–111)
Glucose, Bld: 104 mg/dL — ABNORMAL HIGH (ref 65–99)
Potassium: 4.1 mmol/L (ref 3.5–5.1)
SODIUM: 138 mmol/L (ref 135–145)
TOTAL PROTEIN: 7.9 g/dL (ref 6.5–8.1)

## 2016-03-07 LAB — CA 125: CA 125: 6.2 U/mL (ref 0.0–38.1)

## 2016-03-11 ENCOUNTER — Encounter: Payer: Self-pay | Admitting: Oncology

## 2016-03-11 NOTE — Progress Notes (Signed)
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 BRCA mutation was negative (germline mutation was negative) Somatic mutation was present.  BRCA2 (March of 2017) Rectovaginal fistula status for status post optimal debulking, resection of rectosigmoid and repair of fistula in view [facility] in February of 2014) . VISIT DIAGNOSIS:  Ovarian cancer   ICD-9-CM ICD-10-CM   1. Ovarian cancer, unspecified laterality (HCC) 183.0 C56.9 Comprehensive metabolic panel     CA 585      No history exists.  Interval history Patient had developed rectovaginal fistula in December of 2015.  Patient was admitted in hospital at [facility].  Initially treated conservatively followed by further evaluation patient underwent     : XL, optimal secondary debulking, rectosigmoid resection and reanastomosis with repair of rectovaginal fistula, RSO  Patient is gradually recovering from the surgery feeling stronger.  Here for further follow-up and treatment consideration.  No nausea no vomiting and abdominal pain.  Patient had debulking surgery done.     Problems Addressed During Hospitalization: Principal Problem: Ovarian cancer, unspecified laterality Active Problems: Osteoporosis Acid reflux Essential hypertension Protein-calorie malnutrition, moderate (CMS-HCC) Colovaginal fistula Resolved Problems: Abscess of female pelvis Hypocalcemia Postoperative anemia due to acute  blood loss Diarrhea, unspecified  She is gradually recovering feeling stronger was in rehabilitation diarrhea has resolved. abdomnal   wound is healing well.   PAST MEDICAL HISTORY: Past Medical History  Diagnosis Date  . Glaucoma   . GERD (gastroesophageal reflux disease)   . Hypercholesteremia   . PONV (postoperative nausea and vomiting)   . Hypertension   . Anxiety   . Cancer [facility])     PAST SURGICAL HISTORY: Past Surgical History  Procedure Laterality Date  . Abdominal hysterectomy    . Replacement total knee      Right  . Breast biopsy    . Hand surgery      Right  . Laparotomy Left 07/20/2015    Procedure: LAPAROTOMY with Left Salpingooophrectomy;  Surgeon: Honor Loh Ward, MD;  Location: [facility] ORS;  Service: Gynecology;  Laterality: Left;  . Laparotomy N/A 07/20/2015    Procedure: LAPAROTOMY;  Surgeon: Gillis Ends, MD;  Location: [facility] ORS;  Service: Gynecology;  Laterality: N/A;  . Salpingoophorectomy Right 07/20/2015    Procedure: SALPINGO OOPHORECTOMY;  Surgeon: Gillis Ends, MD;  Location: [facility] ORS;  Service: Gynecology;  Laterality: Right;  . Peripheral vascular catheterization N/A 08/10/2015    Procedure: Glori Luis Cath Insertion;  Surgeon: Algernon Huxley, MD;  Location: [facility];  Service: Cardiovascular;  Laterality: N/A;  . Exploratory laparotomy w/ bowel resection  01/11/2016    XL optimal debulking rectosigmoid resection and reanastomosis    FAMILY HISTORY No family history on file. There is no significant family history of breast cancer, ovarian cancer, colon cancer       ADVANCED DIRECTIVES:  Patient does have advance healthcare directive, Patient   does not desire to make any changes  HEALTH MAINTENANCE: Social History  Substance Use Topics  . Smoking status: Never Smoker   .  Smokeless tobacco: Never Used  . Alcohol Use: No      Allergies  Allergen Reactions  . Clindamycin/Lincomycin Diarrhea and Other (See Comments)     Reaction:  Antibiotic colitis   . Codeine Anxiety    Current Outpatient Prescriptions  Medication Sig Dispense Refill  . acetaminophen (TYLENOL) 500 MG tablet Take 500-1,000 mg by mouth every 6 (six) hours as needed for mild pain.     . bimatoprost (LUMIGAN) 0.01 % SOLN Place 1 drop into both eyes at bedtime.    . fluticasone (FLONASE) 50 MCG/ACT nasal spray Place 2 sprays into both nostrils daily as needed for rhinitis.     Marland Kitchen ibuprofen (ADVIL,MOTRIN) 200 MG tablet Take 400 mg by mouth every 6 (six) hours as needed for mild pain.    Marland Kitchen lidocaine-prilocaine (EMLA) cream Apply 1 application topically as needed. (Patient taking differently: Apply 1 application topically as needed (prior to accessing port). ) 30 g 3  . LORazepam (ATIVAN) 0.5 MG tablet Take 0.5 mg by mouth 2 (two) times daily.     . meloxicam (MOBIC) 15 MG tablet Take 15 mg by mouth daily.     Marland Kitchen menthol-cetylpyridinium (CEPACOL) 3 MG lozenge Take 1 lozenge (3 mg total) by mouth every 2 (two) hours as needed for sore throat. 100 tablet 12  . omeprazole (PRILOSEC) 20 MG capsule Take 20 mg by mouth daily.     . ondansetron (ZOFRAN) 4 MG tablet Take 1 tablet (4 mg total) by mouth every 6 (six) hours as needed for nausea. 30 tablet 3  . simvastatin (ZOCOR) 20 MG tablet Take 20 mg by mouth at bedtime.     . traMADol (ULTRAM) 50 MG tablet Take 1 tablet (50 mg total) by mouth every 6 (six) hours. 65 tablet 0  . vitamin C (ASCORBIC ACID) 500 MG tablet Take 500 mg by mouth daily.     No current facility-administered medications for this visit.   Facility-Administered Medications Ordered in Other Visits  Medication Dose Route Frequency Provider Last Rate Last Dose  . diphenhydrAMINE (BENADRYL) injection 12.5 mg  12.5 mg Intravenous Once Forest Gleason, MD      . methylPREDNISolone sodium succinate (SOLU-MEDROL) 125 mg/2 mL injection 100 mg  100 mg Intravenous Once Forest Gleason, MD        OBJECTIVE: PHYSICAL EXAM: Gen. status: Patient is  alert oriented not any acute distress.. Lymphatic system: Supraclavicular, cervical, axillary, inguinal lymph nodes are not palpable Cardiac exam revealed the PMI to be normally situated and sized. The rhythm was regular and no extrasystoles were noted during several minutes of auscultation. The first and second heart sounds were normal and physiologic splitting of the second heart sound was noted. There were no murmurs, rubs, clicks, or gallops. Examination of the chest was unremarkable. There were no bony deformities, no asymmetry, and no other abnormalities. Abdomen: Soft no ascites no palpable masses.  Abdominal wound has healed. Examination of the skin revealed no evidence of significant rashes, suspicious appearing nevi or other concerning lesions. Neurologically, the patient was awake, alert, and oriented to person, place and time. There were no obvious focal neurologic abnormalities.  Lower extremity no swelling Musculoskeletal system within normal limit   Filed Vitals:   03/06/16 1051  BP: 115/78  Pulse: 89  Temp: 96.6 F (35.9 C)  Resp: 18     Body mass index is 22.96 kg/(m^2).    ECOG FS:1 - Symptomatic but completely ambulatory  LAB RESULTS:  Appointment on 03/06/2016  Component Date Value Ref Range Status  . Sodium 03/06/2016 138  135 - 145 mmol/L Final  . Potassium 03/06/2016 4.1  3.5 - 5.1 mmol/L Final  . Chloride 03/06/2016 102  101 - 111 mmol/L Final  . CO2 03/06/2016 29  22 - 32 mmol/L Final  . Glucose, Bld 03/06/2016 104* 65 - 99 mg/dL Final  . BUN 03/06/2016 24* 6 - 20 mg/dL Final  . Creatinine, Ser 03/06/2016 0.75  0.44 - 1.00 mg/dL Final  . Calcium 03/06/2016 9.5  8.9 - 10.3 mg/dL Final  . Total Protein 03/06/2016 7.9  6.5 - 8.1 g/dL Final  . Albumin 03/06/2016 4.4  3.5 - 5.0 g/dL Final  . AST 03/06/2016 25  15 - 41 U/L Final  . ALT 03/06/2016 16  14 - 54 U/L Final  . Alkaline Phosphatase 03/06/2016 59  38 - 126 U/L Final  . Total Bilirubin 03/06/2016 0.8   0.3 - 1.2 mg/dL Final  . GFR calc non Af Amer 03/06/2016 >60  >60 mL/min Final  . GFR calc Af Amer 03/06/2016 >60  >60 mL/min Final   Comment: (NOTE) The eGFR has been calculated using the CKD EPI equation. This calculation has not been validated in all clinical situations. eGFR's persistently <60 mL/min signify possible Chronic Kidney Disease.   . Anion gap 03/06/2016 7  5 - 15 Final  . CA 125 03/06/2016 6.2  0.0 - 38.1 U/mL Final   Comment: (NOTE) [employer] ECLIA methodology Performed At: [facility] [place], [place] [id number] Lindon Romp MD [id number]    Pathologpathology report from [facility] in (February, 2017)  [place]  Component Name Value Range  Case Report Surgical Pathology                                Case: [id number]                                Authorizing Provider:  Gillis Ends, MD Collected:           01/11/2016 0957             Ordering Location:     [facility]          Received:            01/11/2016 1618             Pathologist:           Lelon Frohlich, MD                                                     Specimens:   A) - Peritoneum, anterior peritoneal biopsy                                                        B) - Ovary, Right, Right fallopian tube and ovary  C) - Omentum, omental cake                                                                         D) - Large Intestine, NOS, Rectosigmoid                                                            E) - Other, Proximal Distal donut                                                       DIAGNOSIS A. Anterior peritoneum, biopsy:  High grade serous adenocarcinoma.     B. Right ovary and fallopian tube, salpingo-oophorectomy:  Ovary with residual high grade serous adenocarcinoma.  Fallopian tube: Negative for malignancy.    C. Omental cake, biopsy:  High grade  serous adenocarcinoma.   D. Rectosigmoid colon, excision:  Benign colonic tissue with diverticula.    E. Proximal distal donut, excision:  Benign colonic tissue.    Synoptic Report OVARY or FALLOPIAN TUBE: Oophorectomy, Salpingectomy, Salpingo-Oophorectomy, Subtotal Oophorectomy or Removal of Tumor in Fragments, Hysterectomy With Salpingo-Oophorectomy or Salpingectomy  (Ovary FT - All Specimens)   SPECIMEN    Procedure:    Right salpingo-oophorectomy    Procedure:    Omentectomy    Lymph Node Sampling:    Not performed    Specimen Integrity:    Right ovary      Specimen Integrity of Right Ovary:    Capsule intact    Specimen Integrity:    Right fallopian tube      Specimen Integrity of Right Fallopian Tube:    Intact  TUMOR    Primary Tumor Site(s):    Not specified    Histologic Type:    Serous high-grade carcinoma      Ovarian Surface Involvement:    Present      Noninvasive Implant(s):    Not applicable      Specimen(s):    Right ovary        Right Ovary - Extent:    Involved      Specimen(s):    Right fallopian tube        Right Fallopian Tube - Extent:    Not involved      Specimen(s):    Omentum        Omentum - Extent:    Involved      Peritoneal Ascitic Fluid:    Not performed / unknown      Pleural Fluid:    Not performed / unknown      Treatment Effect:    Moderate response identified (CRS score 2)  LYMPH NODES    Regional Lymph Nodes:    No lymph nodes submitted or found    Lymph Node Involvement:    Not applicable  STAGE (pTNM)    TNM Descriptors:    y (  post-treatment)    Primary Tumor (pT):    Ovary      Ovarian Primary Tumor (pT):    pT3c and / or N1: Peritoneal metastasis beyond pelvis > 2 cm in greatest dimension and / or regional lymph node metastasis    Regional Lymph Nodes (pN):    pNX: Cannot be assessed    Distant Metastasis (pM):    Not applicable - pM cannot be determined        ASSESSMENT:  had a significant response to the  chemotherapy. Rectovaginal fistula secondary to chemotherapy any Avastin therapy which is resolved.  Has undergone optimal debulking.  (pT3C) Here for further evaluation and treatment consideration I would like to wait for next few weeks for patient to get stronger and improve before any further consideration of treatment is being planned.  BRCA mutation is positive (somatic mutation)  Proceed with carboplatinum on day 1 and Taxol on day 1 and 8.  Repeat treatment in 3 weeks.  Patient expressed understanding and was in agreement with this plan. She also understands that She can call clinic at any time with any questions, concerns, or complaints.    No matching staging information was found for the patient.  Forest Gleason, MD   03/11/2016 8:57 AM

## 2016-03-13 ENCOUNTER — Inpatient Hospital Stay: Payer: Medicare Other

## 2016-03-13 VITALS — BP 115/78 | HR 86 | Resp 20

## 2016-03-13 DIAGNOSIS — C569 Malignant neoplasm of unspecified ovary: Secondary | ICD-10-CM | POA: Diagnosis not present

## 2016-03-13 MED ORDER — HEPARIN SOD (PORK) LOCK FLUSH 100 UNIT/ML IV SOLN
500.0000 [IU] | Freq: Once | INTRAVENOUS | Status: AC | PRN
  Administered 2016-03-13: 500 [IU]
  Filled 2016-03-13: qty 5

## 2016-03-13 MED ORDER — SODIUM CHLORIDE 0.9 % IV SOLN
306.0000 mg | Freq: Once | INTRAVENOUS | Status: AC
  Administered 2016-03-13: 310 mg via INTRAVENOUS
  Filled 2016-03-13: qty 31

## 2016-03-13 MED ORDER — PALONOSETRON HCL INJECTION 0.25 MG/5ML
0.2500 mg | Freq: Once | INTRAVENOUS | Status: AC
  Administered 2016-03-13: 0.25 mg via INTRAVENOUS
  Filled 2016-03-13: qty 5

## 2016-03-13 MED ORDER — PACLITAXEL PROTEIN-BOUND CHEMO INJECTION 100 MG
80.0000 mg/m2 | Freq: Once | INTRAVENOUS | Status: AC
Start: 2016-03-13 — End: 2016-03-13
  Administered 2016-03-13: 125 mg via INTRAVENOUS
  Filled 2016-03-13: qty 25

## 2016-03-13 MED ORDER — SODIUM CHLORIDE 0.9 % IV SOLN
Freq: Once | INTRAVENOUS | Status: AC
  Administered 2016-03-13: 10:00:00 via INTRAVENOUS
  Filled 2016-03-13: qty 1000

## 2016-03-13 MED ORDER — SODIUM CHLORIDE 0.9 % IV SOLN
Freq: Once | INTRAVENOUS | Status: AC
  Administered 2016-03-13: 11:00:00 via INTRAVENOUS
  Filled 2016-03-13: qty 5

## 2016-03-13 NOTE — Progress Notes (Signed)
Last labs are from 4/12. Spoke with Hildred Alamin who stated MD wants to use these labs for treatment today.

## 2016-03-21 ENCOUNTER — Inpatient Hospital Stay (HOSPITAL_BASED_OUTPATIENT_CLINIC_OR_DEPARTMENT_OTHER): Payer: Medicare Other | Admitting: Oncology

## 2016-03-21 ENCOUNTER — Inpatient Hospital Stay: Payer: Medicare Other

## 2016-03-21 ENCOUNTER — Encounter: Payer: Self-pay | Admitting: Oncology

## 2016-03-21 ENCOUNTER — Inpatient Hospital Stay: Payer: Medicare Other | Attending: Oncology

## 2016-03-21 VITALS — BP 109/62 | HR 121 | Temp 97.4°F | Resp 18 | Wt 114.0 lb

## 2016-03-21 DIAGNOSIS — M818 Other osteoporosis without current pathological fracture: Secondary | ICD-10-CM

## 2016-03-21 DIAGNOSIS — C561 Malignant neoplasm of right ovary: Secondary | ICD-10-CM

## 2016-03-21 DIAGNOSIS — R531 Weakness: Secondary | ICD-10-CM | POA: Diagnosis not present

## 2016-03-21 DIAGNOSIS — C569 Malignant neoplasm of unspecified ovary: Secondary | ICD-10-CM

## 2016-03-21 DIAGNOSIS — F419 Anxiety disorder, unspecified: Secondary | ICD-10-CM

## 2016-03-21 DIAGNOSIS — Z1501 Genetic susceptibility to malignant neoplasm of breast: Secondary | ICD-10-CM | POA: Diagnosis not present

## 2016-03-21 DIAGNOSIS — I10 Essential (primary) hypertension: Secondary | ICD-10-CM | POA: Insufficient documentation

## 2016-03-21 DIAGNOSIS — K219 Gastro-esophageal reflux disease without esophagitis: Secondary | ICD-10-CM | POA: Diagnosis not present

## 2016-03-21 DIAGNOSIS — E7800 Pure hypercholesterolemia, unspecified: Secondary | ICD-10-CM

## 2016-03-21 DIAGNOSIS — Z79899 Other long term (current) drug therapy: Secondary | ICD-10-CM | POA: Insufficient documentation

## 2016-03-21 DIAGNOSIS — Z5111 Encounter for antineoplastic chemotherapy: Secondary | ICD-10-CM | POA: Insufficient documentation

## 2016-03-21 DIAGNOSIS — E440 Moderate protein-calorie malnutrition: Secondary | ICD-10-CM

## 2016-03-21 DIAGNOSIS — R5383 Other fatigue: Secondary | ICD-10-CM | POA: Insufficient documentation

## 2016-03-21 LAB — CBC WITH DIFFERENTIAL/PLATELET
BASOS ABS: 0.1 10*3/uL (ref 0–0.1)
BASOS PCT: 0 %
EOS ABS: 0.1 10*3/uL (ref 0–0.7)
Eosinophils Relative: 1 %
HCT: 33.7 % — ABNORMAL LOW (ref 35.0–47.0)
HEMOGLOBIN: 11.6 g/dL — AB (ref 12.0–16.0)
Lymphocytes Relative: 28 %
Lymphs Abs: 3.4 10*3/uL (ref 1.0–3.6)
MCH: 30.6 pg (ref 26.0–34.0)
MCHC: 34.6 g/dL (ref 32.0–36.0)
MCV: 88.4 fL (ref 80.0–100.0)
Monocytes Absolute: 1.2 10*3/uL — ABNORMAL HIGH (ref 0.2–0.9)
Monocytes Relative: 10 %
NEUTROS ABS: 7.5 10*3/uL — AB (ref 1.4–6.5)
NEUTROS PCT: 61 %
Platelets: 256 10*3/uL (ref 150–440)
RBC: 3.81 MIL/uL (ref 3.80–5.20)
RDW: 16.9 % — ABNORMAL HIGH (ref 11.5–14.5)
WBC: 12.2 10*3/uL — AB (ref 3.6–11.0)

## 2016-03-21 MED ORDER — HEPARIN SOD (PORK) LOCK FLUSH 100 UNIT/ML IV SOLN
500.0000 [IU] | Freq: Once | INTRAVENOUS | Status: AC
  Administered 2016-03-21: 500 [IU] via INTRAVENOUS
  Filled 2016-03-21: qty 5

## 2016-03-21 MED ORDER — SODIUM CHLORIDE 0.9% FLUSH
10.0000 mL | Freq: Once | INTRAVENOUS | Status: AC
  Administered 2016-03-21: 10 mL via INTRAVENOUS
  Filled 2016-03-21: qty 10

## 2016-03-21 MED ORDER — SODIUM CHLORIDE 0.9 % IV SOLN
Freq: Once | INTRAVENOUS | Status: AC
  Administered 2016-03-21: 15:00:00 via INTRAVENOUS
  Filled 2016-03-21: qty 1000

## 2016-03-21 MED ORDER — SODIUM CHLORIDE 0.9 % IV SOLN
Freq: Once | INTRAVENOUS | Status: AC
  Administered 2016-03-21: 15:00:00 via INTRAVENOUS
  Filled 2016-03-21: qty 4

## 2016-03-21 MED ORDER — PACLITAXEL PROTEIN-BOUND CHEMO INJECTION 100 MG
80.0000 mg/m2 | Freq: Once | INTRAVENOUS | Status: AC
  Administered 2016-03-21: 125 mg via INTRAVENOUS
  Filled 2016-03-21: qty 25

## 2016-03-21 NOTE — Progress Notes (Signed)
Patient states she has had a lot of weakness and fatigue since her last treatment.  Overall feels like it has "knocked her down".  Appetite decreased.  Drinking Premier protein shakes twice a day.  States she is having to have more frequent BM's but less amounts.

## 2016-03-22 LAB — CA 125: CA 125: 5.8 U/mL (ref 0.0–38.1)

## 2016-03-26 ENCOUNTER — Encounter: Payer: Self-pay | Admitting: Oncology

## 2016-03-26 NOTE — Progress Notes (Signed)
[place] @ [facility] Telephone:(336) [phone]  Fax:(336) [place] OB: 1929-07-31  MR#: [id number]  WHQ#:[id number]  Patient Care Team: Albina Billet, MD as PCP - General (Internal Medicine) [facility] Gaetana Michaelis, MD as Referring Physician (Obstetrics and Gynecology) Maceo Pro, MD as Referring Physician (Obstetrics and Gynecology)  CHIEF COMPLAINT: (oncology history)  Carcinoma of ovary status post exploratory laparotomy and biopsy Extensive disease stage IIIc T IIIcNX M0 tumor Suboptimal debulking (September, 2016) . BRCA mutation was negative (germline mutation was negative) Somatic mutation was present.  BRCA2 (March of 2017) Rectovaginal fistula status for status post optimal debulking, resection of rectosigmoid and repair of fistula in view [facility] in February of 2014) . VISIT DIAGNOSIS:  Ovarian cancer   ICD-9-CM ICD-10-CM   1. Ovarian cancer, unspecified laterality ([place]) 183.0 C56.9 Comprehensive metabolic panel     Magnesium      No history exists.  Interval history Patient had developed rectovaginal fistula in December of 2015.  Patient states she has had a lot of weakness and fatigue since her last treatment. Overall feels like it has "knocked her down". Appetite decreased. Drinking Premier protein shakes twice a day. States she is having to have more frequent BM's but less amounts.   : XL, optimal secondary debulking, rectosigmoid resection and reanastomosis with repair of rectovaginal fistula, RSO  Patient is gradually recovering from the surgery feeling stronger.  Here for further follow-up and treatment consideration.  No nausea no vomiting and abdominal pain.  Patient had debulking surgery done.    Patient is here for ongoing evaluation and continuation of chemotherapy.  T due for Taxol treatment  Problems Addressed During Hospitalization: Principal Problem: Ovarian cancer, unspecified laterality Active  Problems: Osteoporosis Acid reflux Essential hypertension Protein-calorie malnutrition, moderate (CMS-HCC) Colovaginal fistula Resolved Problems: Abscess of female pelvis Hypocalcemia Postoperative anemia due to acute blood loss Diarrhea, unspecified  She is gradually recovering feeling stronger was in rehabilitation diarrhea has resolved. abdomnal   wound is healing well.   PAST MEDICAL HISTORY: Past Medical History  Diagnosis Date  . Glaucoma   . GERD (gastroesophageal reflux disease)   . Hypercholesteremia   . PONV (postoperative nausea and vomiting)   . Hypertension   . Anxiety   . Cancer [facility])     PAST SURGICAL HISTORY: Past Surgical History  Procedure Laterality Date  . Abdominal hysterectomy    . Replacement total knee      Right  . Breast biopsy    . Hand surgery      Right  . Laparotomy Left 07/20/2015    Procedure: LAPAROTOMY with Left Salpingooophrectomy;  Surgeon: Honor Loh Ward, MD;  Location: [facility] ORS;  Service: Gynecology;  Laterality: Left;  . Laparotomy N/A 07/20/2015    Procedure: LAPAROTOMY;  Surgeon: Gillis Ends, MD;  Location: [facility] ORS;  Service: Gynecology;  Laterality: N/A;  . Salpingoophorectomy Right 07/20/2015    Procedure: SALPINGO OOPHORECTOMY;  Surgeon: Gillis Ends, MD;  Location: [facility] ORS;  Service: Gynecology;  Laterality: Right;  . Peripheral vascular catheterization N/A 08/10/2015    Procedure: Glori Luis Cath Insertion;  Surgeon: Algernon Huxley, MD;  Location: [place] [facility];  Service: Cardiovascular;  Laterality: N/A;  . Exploratory laparotomy w/ bowel resection  01/11/2016    XL optimal debulking rectosigmoid resection and reanastomosis    FAMILY HISTORY No family history on file. There is no significant family history of breast cancer, ovarian cancer, colon cancer  ADVANCED DIRECTIVES:  Patient does have advance healthcare directive, Patient   does not desire to make any changes  HEALTH  MAINTENANCE: Social History  Substance Use Topics  . Smoking status: Never Smoker   . Smokeless tobacco: Never Used  . Alcohol Use: No      Allergies  Allergen Reactions  . Clindamycin/Lincomycin Diarrhea and Other (See Comments)    Reaction:  Antibiotic colitis   . Codeine Anxiety    Current Outpatient Prescriptions  Medication Sig Dispense Refill  . acetaminophen (TYLENOL) 500 MG tablet Take 500-1,000 mg by mouth every 6 (six) hours as needed for mild pain.     . bimatoprost (LUMIGAN) 0.01 % SOLN Place 1 drop into both eyes at bedtime.    . fluticasone (FLONASE) 50 MCG/ACT nasal spray Place 2 sprays into both nostrils daily as needed for rhinitis.     Marland Kitchen ibuprofen (ADVIL,MOTRIN) 200 MG tablet Take 400 mg by mouth every 6 (six) hours as needed for mild pain.    Marland Kitchen lidocaine-prilocaine (EMLA) cream Apply 1 application topically as needed. (Patient taking differently: Apply 1 application topically as needed (prior to accessing port). ) 30 g 3  . LORazepam (ATIVAN) 0.5 MG tablet Take 0.5 mg by mouth 2 (two) times daily.     . meloxicam (MOBIC) 15 MG tablet Take 15 mg by mouth daily.     Marland Kitchen menthol-cetylpyridinium (CEPACOL) 3 MG lozenge Take 1 lozenge (3 mg total) by mouth every 2 (two) hours as needed for sore throat. 100 tablet 12  . omeprazole (PRILOSEC) 20 MG capsule Take 20 mg by mouth daily.     . ondansetron (ZOFRAN) 4 MG tablet Take 1 tablet (4 mg total) by mouth every 6 (six) hours as needed for nausea. 30 tablet 3  . simvastatin (ZOCOR) 20 MG tablet Take 20 mg by mouth at bedtime.     . traMADol (ULTRAM) 50 MG tablet Take 1 tablet (50 mg total) by mouth every 6 (six) hours. 65 tablet 0  . vitamin C (ASCORBIC ACID) 500 MG tablet Take 500 mg by mouth daily.     No current facility-administered medications for this visit.   Facility-Administered Medications Ordered in Other Visits  Medication Dose Route Frequency Provider Last Rate Last Dose  . diphenhydrAMINE (BENADRYL)  injection 12.5 mg  12.5 mg Intravenous Once Forest Gleason, MD      . methylPREDNISolone sodium succinate (SOLU-MEDROL) 125 mg/2 mL injection 100 mg  100 mg Intravenous Once Forest Gleason, MD        OBJECTIVE: PHYSICAL EXAM: Gen. status: Patient is alert oriented not any acute distress.. Lymphatic system: Supraclavicular, cervical, axillary, inguinal lymph nodes are not palpable Cardiac exam revealed the PMI to be normally situated and sized. The rhythm was regular and no extrasystoles were noted during several minutes of auscultation. The first and second heart sounds were normal and physiologic splitting of the second heart sound was noted. There were no murmurs, rubs, clicks, or gallops. Examination of the chest was unremarkable. There were no bony deformities, no asymmetry, and no other abnormalities. Abdomen: Soft no ascites no palpable masses.  Abdominal wound has healed. Examination of the skin revealed no evidence of significant rashes, suspicious appearing nevi or other concerning lesions. Neurologically, the patient was awake, alert, and oriented to person, place and time. There were no obvious focal neurologic abnormalities.  Lower extremity no swelling Musculoskeletal system within normal limit   Filed Vitals:   03/21/16 1413  BP: 109/62  Pulse: 121  Temp: 97.4 F (36.3 C)  Resp: 18     Body mass index is 23.01 kg/(m^2).    ECOG FS:1 - Symptomatic but completely ambulatory  LAB RESULTS:  Infusion on 03/21/2016  Component Date Value Ref Range Status  . WBC 03/21/2016 12.2* 3.6 - 11.0 K/uL Final  . RBC 03/21/2016 3.81  3.80 - 5.20 MIL/uL Final  . Hemoglobin 03/21/2016 11.6* 12.0 - 16.0 g/dL Final  . HCT 03/21/2016 33.7* 35.0 - 47.0 % Final  . MCV 03/21/2016 88.4  80.0 - 100.0 fL Final  . MCH 03/21/2016 30.6  26.0 - 34.0 pg Final  . MCHC 03/21/2016 34.6  32.0 - 36.0 g/dL Final  . RDW 03/21/2016 16.9* 11.5 - 14.5 % Final  . Platelets 03/21/2016 256  150 - 440 K/uL Final  .  Neutrophils Relative % 03/21/2016 61   Final  . Neutro Abs 03/21/2016 7.5* 1.4 - 6.5 K/uL Final  . Lymphocytes Relative 03/21/2016 28   Final  . Lymphs Abs 03/21/2016 3.4  1.0 - 3.6 K/uL Final  . Monocytes Relative 03/21/2016 10   Final  . Monocytes Absolute 03/21/2016 1.2* 0.2 - 0.9 K/uL Final  . Eosinophils Relative 03/21/2016 1   Final  . Eosinophils Absolute 03/21/2016 0.1  0 - 0.7 K/uL Final  . Basophils Relative 03/21/2016 0   Final  . Basophils Absolute 03/21/2016 0.1  0 - 0.1 K/uL Final  . CA 125 03/21/2016 5.8  0.0 - 38.1 U/mL Final   Comment: (NOTE) Roche ECLIA methodology Performed At: [facility] [address] [place], [place] [id number] Lindon Romp MD CL:[id number]    Pathologpathology report from [facility] in (February, 2017)  [place]  Component Name Value Range  Case Report Surgical Pathology                                Case: [id number]                                Authorizing Provider:  Gillis Ends, MD Collected:           01/11/2016 0957             Ordering Location:     [facility]          Received:            01/11/2016 1618             Pathologist:           Lelon Frohlich, MD                                                     Specimens:   A) - Peritoneum, anterior peritoneal biopsy                                                        B) - Ovary, Right, Right fallopian tube and ovary  C) - Omentum, omental cake                                                                         D) - Large Intestine, NOS, Rectosigmoid                                                            E) - Other, Proximal Distal donut                                                       DIAGNOSIS A. Anterior peritoneum, biopsy:  High grade serous adenocarcinoma.     B. Right ovary and fallopian tube, salpingo-oophorectomy:  Ovary with residual high grade  serous adenocarcinoma.  Fallopian tube: Negative for malignancy.    C. Omental cake, biopsy:  High grade serous adenocarcinoma.   D. Rectosigmoid colon, excision:  Benign colonic tissue with diverticula.    E. Proximal distal donut, excision:  Benign colonic tissue.    Synoptic Report OVARY or FALLOPIAN TUBE: Oophorectomy, Salpingectomy, Salpingo-Oophorectomy, Subtotal Oophorectomy or Removal of Tumor in Fragments, Hysterectomy With Salpingo-Oophorectomy or Salpingectomy  (Ovary FT - All Specimens)   SPECIMEN    Procedure:    Right salpingo-oophorectomy    Procedure:    Omentectomy    Lymph Node Sampling:    Not performed    Specimen Integrity:    Right ovary      Specimen Integrity of Right Ovary:    Capsule intact    Specimen Integrity:    Right fallopian tube      Specimen Integrity of Right Fallopian Tube:    Intact  TUMOR    Primary Tumor Site(s):    Not specified    Histologic Type:    Serous high-grade carcinoma      Ovarian Surface Involvement:    Present      Noninvasive Implant(s):    Not applicable      Specimen(s):    Right ovary        Right Ovary - Extent:    Involved      Specimen(s):    Right fallopian tube        Right Fallopian Tube - Extent:    Not involved      Specimen(s):    Omentum        Omentum - Extent:    Involved      Peritoneal Ascitic Fluid:    Not performed / unknown      Pleural Fluid:    Not performed / unknown      Treatment Effect:    Moderate response identified (CRS score 2)  LYMPH NODES    Regional Lymph Nodes:    No lymph nodes submitted or found    Lymph Node Involvement:    Not applicable  STAGE (pTNM)    TNM Descriptors:    y (  post-treatment)    Primary Tumor (pT):    Ovary      Ovarian Primary Tumor (pT):    pT3c and / or N1: Peritoneal metastasis beyond pelvis > 2 cm in greatest dimension and / or regional lymph node metastasis    Regional Lymph Nodes (pN):    pNX: Cannot be assessed    Distant Metastasis (pM):    Not  applicable - pM cannot be determined        ASSESSMENT:  had a significant response to the chemotherapy. Rectovaginal fistula secondary to chemotherapy any Avastin therapy which is resolved.  Has undergone optimal debulking.  (pT3C) Here for further evaluation and treatment consideration I would like to wait for next few weeks for patient to get stronger and improve before any further consideration of treatment is being planned.  BRCA mutation is positive (somatic mutation)  Proceed with carboplatinum on day 1 and Taxol on day 1 and 8.  Repeat treatment in 3 weeks. All lab data was evaluated  Patient expressed understanding and was in agreement with this plan. She also understands that She can call clinic at any time with any questions, concerns, or complaints.    No matching staging information was found for the patient.  Forest Gleason, MD   03/26/2016 3:56 PM

## 2016-03-29 ENCOUNTER — Inpatient Hospital Stay (HOSPITAL_BASED_OUTPATIENT_CLINIC_OR_DEPARTMENT_OTHER): Payer: Medicare Other | Admitting: Oncology

## 2016-03-29 ENCOUNTER — Inpatient Hospital Stay: Payer: Medicare Other

## 2016-03-29 ENCOUNTER — Encounter: Payer: Self-pay | Admitting: Oncology

## 2016-03-29 VITALS — BP 129/82 | HR 92 | Temp 96.7°F | Resp 1 | Wt 115.3 lb

## 2016-03-29 DIAGNOSIS — C561 Malignant neoplasm of right ovary: Secondary | ICD-10-CM | POA: Diagnosis not present

## 2016-03-29 DIAGNOSIS — F419 Anxiety disorder, unspecified: Secondary | ICD-10-CM

## 2016-03-29 DIAGNOSIS — R531 Weakness: Secondary | ICD-10-CM | POA: Diagnosis not present

## 2016-03-29 DIAGNOSIS — I10 Essential (primary) hypertension: Secondary | ICD-10-CM

## 2016-03-29 DIAGNOSIS — M818 Other osteoporosis without current pathological fracture: Secondary | ICD-10-CM

## 2016-03-29 DIAGNOSIS — Z1501 Genetic susceptibility to malignant neoplasm of breast: Secondary | ICD-10-CM

## 2016-03-29 DIAGNOSIS — Z79899 Other long term (current) drug therapy: Secondary | ICD-10-CM

## 2016-03-29 DIAGNOSIS — C569 Malignant neoplasm of unspecified ovary: Secondary | ICD-10-CM

## 2016-03-29 DIAGNOSIS — K219 Gastro-esophageal reflux disease without esophagitis: Secondary | ICD-10-CM

## 2016-03-29 DIAGNOSIS — R5383 Other fatigue: Secondary | ICD-10-CM

## 2016-03-29 DIAGNOSIS — E440 Moderate protein-calorie malnutrition: Secondary | ICD-10-CM

## 2016-03-29 DIAGNOSIS — E7800 Pure hypercholesterolemia, unspecified: Secondary | ICD-10-CM

## 2016-03-29 LAB — COMPREHENSIVE METABOLIC PANEL
ALK PHOS: 77 U/L (ref 38–126)
ALT: 29 U/L (ref 14–54)
ANION GAP: 10 (ref 5–15)
AST: 33 U/L (ref 15–41)
Albumin: 3.4 g/dL — ABNORMAL LOW (ref 3.5–5.0)
BILIRUBIN TOTAL: 0.5 mg/dL (ref 0.3–1.2)
BUN: 14 mg/dL (ref 6–20)
CALCIUM: 8.8 mg/dL — AB (ref 8.9–10.3)
CO2: 24 mmol/L (ref 22–32)
Chloride: 98 mmol/L — ABNORMAL LOW (ref 101–111)
Creatinine, Ser: 0.8 mg/dL (ref 0.44–1.00)
GFR calc non Af Amer: >60 mL/min (ref >60)
Glucose, Bld: 140 mg/dL — ABNORMAL HIGH (ref 65–99)
Potassium: 3.6 mmol/L (ref 3.5–5.1)
SODIUM: 132 mmol/L — AB (ref 135–145)
TOTAL PROTEIN: 6.8 g/dL (ref 6.5–8.1)

## 2016-03-29 LAB — CBC WITH DIFFERENTIAL/PLATELET
BASOS PCT: 1 %
Basophils Absolute: 0.1 10*3/uL (ref 0–0.1)
Eosinophils Absolute: 0 10*3/uL (ref 0–0.7)
Eosinophils Relative: 0 %
HCT: 32.2 % — ABNORMAL LOW (ref 35.0–47.0)
Hemoglobin: 11 g/dL — ABNORMAL LOW (ref 12.0–16.0)
LYMPHS ABS: 3.2 10*3/uL (ref 1.0–3.6)
Lymphocytes Relative: 33 %
MCH: 30.4 pg (ref 26.0–34.0)
MCHC: 34.2 g/dL (ref 32.0–36.0)
MCV: 88.8 fL (ref 80.0–100.0)
MONO ABS: 1.2 10*3/uL — AB (ref 0.2–0.9)
MONOS PCT: 12 %
NEUTROS ABS: 5.4 10*3/uL (ref 1.4–6.5)
Neutrophils Relative %: 54 %
Platelets: 257 10*3/uL (ref 150–440)
RBC: 3.62 MIL/uL — ABNORMAL LOW (ref 3.80–5.20)
RDW: 16.9 % — AB (ref 11.5–14.5)
WBC: 9.9 10*3/uL (ref 3.6–11.0)

## 2016-03-29 LAB — MAGNESIUM: Magnesium: 1.4 mg/dL — ABNORMAL LOW (ref 1.7–2.4)

## 2016-03-29 MED ORDER — SODIUM CHLORIDE 0.9% FLUSH
10.0000 mL | Freq: Once | INTRAVENOUS | Status: AC
  Administered 2016-03-29: 10 mL via INTRAVENOUS
  Filled 2016-03-29: qty 10

## 2016-03-29 MED ORDER — SODIUM CHLORIDE 0.9 % IV SOLN
Freq: Once | INTRAVENOUS | Status: AC
  Administered 2016-03-29: 16:00:00 via INTRAVENOUS
  Filled 2016-03-29: qty 1000

## 2016-03-29 MED ORDER — SODIUM CHLORIDE 0.9 % IV SOLN
Freq: Once | INTRAVENOUS | Status: AC
  Administered 2016-03-29: 16:00:00 via INTRAVENOUS
  Filled 2016-03-29: qty 4

## 2016-03-29 MED ORDER — PACLITAXEL PROTEIN-BOUND CHEMO INJECTION 100 MG
80.0000 mg/m2 | Freq: Once | INTRAVENOUS | Status: AC
  Administered 2016-03-29: 125 mg via INTRAVENOUS
  Filled 2016-03-29: qty 25

## 2016-03-29 MED ORDER — HEPARIN SOD (PORK) LOCK FLUSH 100 UNIT/ML IV SOLN
500.0000 [IU] | Freq: Once | INTRAVENOUS | Status: AC
  Administered 2016-03-29: 500 [IU] via INTRAVENOUS
  Filled 2016-03-29: qty 5

## 2016-04-01 ENCOUNTER — Encounter: Payer: Self-pay | Admitting: Oncology

## 2016-04-01 NOTE — Progress Notes (Signed)
[place] @ [facility] Telephone:(336) [phone]  Fax:(336) [place] OB: 1928/12/04  MR#: [id number]  VXY#:[id number]  Patient Care Team: Albina Billet, MD as PCP - General (Internal Medicine) [facility] Gaetana Michaelis, MD as Referring Physician (Obstetrics and Gynecology) Maceo Pro, MD as Referring Physician (Obstetrics and Gynecology)  CHIEF COMPLAINT: (oncology history)  Carcinoma of ovary status post exploratory laparotomy and biopsy Extensive disease stage IIIc T IIIcNX M0 tumor Suboptimal debulking (September, 2016) . BRCA mutation was negative (germline) BRCA mutation is positive (somatic mutation in tumor tissue). Patient started back on carboplatinum and Taxol (day 1, 8, 15) from April 4: 017) cycle 4  VISIT DIAGNOSIS:  Ovarian cancer   ICD-9-CM ICD-10-CM   1. Ovarian cancer, unspecified laterality (HCC) 183.0 C56.9 Comprehensive metabolic panel     Magnesium     CA 125      No history exists.      Review of system  general status: Patient is feeling weak and tired.  No change in a performance status.  No chills.  No fever.  Tolerated weekly Taxol much better.  No nausea.  No vomiting.  No tingling.  No numbness. HEENT:  No evidence of stomatitis Lungs: No cough or shortness of breath Cardiac: No chest pain or paroxysmal nocturnal dyspnea GI: No nausea no vomiting no diarrhea no abdominal pain Abdominal wound is healing well. Skin: No rash Lower extremity no swelling Neurological system: No tingling.  No numbness.  No other focal signs Musculoskeletal system no bony pains  PAST MEDICAL HISTORY: Past Medical History  Diagnosis Date  . Glaucoma   . GERD (gastroesophageal reflux disease)   . Hypercholesteremia   . PONV (postoperative nausea and vomiting)   . Hypertension   . Anxiety   . Cancer [facility])     PAST SURGICAL HISTORY: Past Surgical History  Procedure Laterality Date  . Abdominal hysterectomy    .  Replacement total knee      Right  . Breast biopsy    . Hand surgery      Right  . Laparotomy Left 07/20/2015    Procedure: LAPAROTOMY with Left Salpingooophrectomy;  Surgeon: Honor Loh Ward, MD;  Location: [facility] ORS;  Service: Gynecology;  Laterality: Left;  . Laparotomy N/A 07/20/2015    Procedure: LAPAROTOMY;  Surgeon: Gillis Ends, MD;  Location: [facility] ORS;  Service: Gynecology;  Laterality: N/A;  . Salpingoophorectomy Right 07/20/2015    Procedure: SALPINGO OOPHORECTOMY;  Surgeon: Gillis Ends, MD;  Location: [facility] ORS;  Service: Gynecology;  Laterality: Right;  . Peripheral vascular catheterization N/A 08/10/2015    Procedure: Glori Luis Cath Insertion;  Surgeon: Algernon Huxley, MD;  Location: [place] CV LAB;  Service: Cardiovascular;  Laterality: N/A;  . Exploratory laparotomy w/ bowel resection  01/11/2016    XL optimal debulking rectosigmoid resection and reanastomosis    FAMILY HISTORY No family history on file. There is no significant family history of breast cancer, ovarian cancer, colon cancer       ADVANCED DIRECTIVES:  Patient does have advance healthcare directive, Patient   does not desire to make any changes  HEALTH MAINTENANCE: Social History  Substance Use Topics  . Smoking status: Never Smoker   . Smokeless tobacco: Never Used  . Alcohol Use: No      Allergies  Allergen Reactions  . Clindamycin/Lincomycin Diarrhea and Other (See Comments)    Reaction:  Antibiotic colitis   . Codeine Anxiety  Current Outpatient Prescriptions  Medication Sig Dispense Refill  . acetaminophen (TYLENOL) 500 MG tablet Take 500-1,000 mg by mouth every 6 (six) hours as needed for mild pain.     . bimatoprost (LUMIGAN) 0.01 % SOLN Place 1 drop into both eyes at bedtime.    . fluticasone (FLONASE) 50 MCG/ACT nasal spray Place 2 sprays into both nostrils daily as needed for rhinitis.     Marland Kitchen ibuprofen (ADVIL,MOTRIN) 200 MG tablet Take 400 mg by mouth every 6 (six)  hours as needed for mild pain.    Marland Kitchen lidocaine-prilocaine (EMLA) cream Apply 1 application topically as needed. (Patient taking differently: Apply 1 application topically as needed (prior to accessing port). ) 30 g 3  . LORazepam (ATIVAN) 0.5 MG tablet Take 0.5 mg by mouth 2 (two) times daily.     . meloxicam (MOBIC) 15 MG tablet Take 15 mg by mouth daily.     Marland Kitchen menthol-cetylpyridinium (CEPACOL) 3 MG lozenge Take 1 lozenge (3 mg total) by mouth every 2 (two) hours as needed for sore throat. 100 tablet 12  . omeprazole (PRILOSEC) 20 MG capsule Take 20 mg by mouth daily.     . ondansetron (ZOFRAN) 4 MG tablet Take 1 tablet (4 mg total) by mouth every 6 (six) hours as needed for nausea. 30 tablet 3  . simvastatin (ZOCOR) 20 MG tablet Take 20 mg by mouth at bedtime.     . traMADol (ULTRAM) 50 MG tablet Take 1 tablet (50 mg total) by mouth every 6 (six) hours. 65 tablet 0  . vitamin C (ASCORBIC ACID) 500 MG tablet Take 500 mg by mouth daily.     No current facility-administered medications for this visit.   Facility-Administered Medications Ordered in Other Visits  Medication Dose Route Frequency Provider Last Rate Last Dose  . diphenhydrAMINE (BENADRYL) injection 12.5 mg  12.5 mg Intravenous Once Forest Gleason, MD      . methylPREDNISolone sodium succinate (SOLU-MEDROL) 125 mg/2 mL injection 100 mg  100 mg Intravenous Once Forest Gleason, MD        OBJECTIVE: PHYSICAL EXAM: Gen. status: Patient is alert oriented not any acute distress.. Lymphatic system: Supraclavicular, cervical, axillary, inguinal lymph nodes are not palpable Cardiac exam revealed the PMI to be normally situated and sized. The rhythm was regular and no extrasystoles were noted during several minutes of auscultation. The first and second heart sounds were normal and physiologic splitting of the second heart sound was noted. There were no murmurs, rubs, clicks, or gallops. Examination of the chest was unremarkable. There were no bony  deformities, no asymmetry, and no other abnormalities. Abdomen: Soft no ascites no palpable masses.  Abdominal wound has healed. Examination of the skin revealed no evidence of significant rashes, suspicious appearing nevi or other concerning lesions. Neurologically, the patient was awake, alert, and oriented to person, place and time. There were no obvious focal neurologic abnormalities.  Lower extremity no swelling Musculoskeletal system within normal limit   Filed Vitals:   03/29/16 1503  BP: 129/82  Pulse: 92  Temp: 96.7 F (35.9 C)  Resp: 1     Body mass index is 23.28 kg/(m^2).    ECOG FS:1 - Symptomatic but completely ambulatory  LAB RESULTS:  Infusion on 03/29/2016  Component Date Value Ref Range Status  . WBC 03/29/2016 9.9  3.6 - 11.0 K/uL Final  . RBC 03/29/2016 3.62* 3.80 - 5.20 MIL/uL Final  . Hemoglobin 03/29/2016 11.0* 12.0 - 16.0 g/dL Final  . HCT 03/29/2016  32.2* 35.0 - 47.0 % Final  . MCV 03/29/2016 88.8  80.0 - 100.0 fL Final  . MCH 03/29/2016 30.4  26.0 - 34.0 pg Final  . MCHC 03/29/2016 34.2  32.0 - 36.0 g/dL Final  . RDW 03/29/2016 16.9* 11.5 - 14.5 % Final  . Platelets 03/29/2016 257  150 - 440 K/uL Final  . Neutrophils Relative % 03/29/2016 54   Final  . Neutro Abs 03/29/2016 5.4  1.4 - 6.5 K/uL Final  . Lymphocytes Relative 03/29/2016 33   Final  . Lymphs Abs 03/29/2016 3.2  1.0 - 3.6 K/uL Final  . Monocytes Relative 03/29/2016 12   Final  . Monocytes Absolute 03/29/2016 1.2* 0.2 - 0.9 K/uL Final  . Eosinophils Relative 03/29/2016 0   Final  . Eosinophils Absolute 03/29/2016 0.0  0 - 0.7 K/uL Final  . Basophils Relative 03/29/2016 1   Final  . Basophils Absolute 03/29/2016 0.1  0 - 0.1 K/uL Final  . Sodium 03/29/2016 132* 135 - 145 mmol/L Final  . Potassium 03/29/2016 3.6  3.5 - 5.1 mmol/L Final  . Chloride 03/29/2016 98* 101 - 111 mmol/L Final  . CO2 03/29/2016 24  22 - 32 mmol/L Final  . Glucose, Bld 03/29/2016 140* 65 - 99 mg/dL Final  . BUN  03/29/2016 14  6 - 20 mg/dL Final  . Creatinine, Ser 03/29/2016 0.80  0.44 - 1.00 mg/dL Final  . Calcium 03/29/2016 8.8* 8.9 - 10.3 mg/dL Final  . Total Protein 03/29/2016 6.8  6.5 - 8.1 g/dL Final  . Albumin 03/29/2016 3.4* 3.5 - 5.0 g/dL Final  . AST 03/29/2016 33  15 - 41 U/L Final  . ALT 03/29/2016 29  14 - 54 U/L Final  . Alkaline Phosphatase 03/29/2016 77  38 - 126 U/L Final  . Total Bilirubin 03/29/2016 0.5  0.3 - 1.2 mg/dL Final  . GFR calc non Af Amer 03/29/2016 >60  >60 mL/min Final  . GFR calc Af Amer 03/29/2016 >60  >60 mL/min Final   Comment: (NOTE) The eGFR has been calculated using the CKD EPI equation. This calculation has not been validated in all clinical situations. eGFR's persistently <60 mL/min signify possible Chronic Kidney Disease.   . Anion gap 03/29/2016 10  5 - 15 Final  . Magnesium 03/29/2016 1.4* 1.7 - 2.4 mg/dL Final      ASSESSMENT:  Cancer of ovary status post suboptimal debulking surgery because of extensive disease stage III Proceed with day 15 of weekly Taxol chemotherapy  All lab data has been reviewed independently Patient expressed understanding and was in agreement with this plan. She also understands that She can call clinic at any time with any questions, concerns, or complaints.    No matching staging information was found for the patient.  Forest Gleason, MD   04/01/2016 8:12 AM

## 2016-04-05 ENCOUNTER — Inpatient Hospital Stay (HOSPITAL_BASED_OUTPATIENT_CLINIC_OR_DEPARTMENT_OTHER): Payer: Medicare Other | Admitting: Oncology

## 2016-04-05 ENCOUNTER — Encounter: Payer: Self-pay | Admitting: Oncology

## 2016-04-05 ENCOUNTER — Inpatient Hospital Stay: Payer: Medicare Other

## 2016-04-05 VITALS — BP 112/73 | HR 96 | Temp 96.9°F | Resp 18 | Wt 114.3 lb

## 2016-04-05 DIAGNOSIS — M818 Other osteoporosis without current pathological fracture: Secondary | ICD-10-CM | POA: Diagnosis not present

## 2016-04-05 DIAGNOSIS — R531 Weakness: Secondary | ICD-10-CM | POA: Diagnosis not present

## 2016-04-05 DIAGNOSIS — R5383 Other fatigue: Secondary | ICD-10-CM | POA: Diagnosis not present

## 2016-04-05 DIAGNOSIS — E440 Moderate protein-calorie malnutrition: Secondary | ICD-10-CM

## 2016-04-05 DIAGNOSIS — F419 Anxiety disorder, unspecified: Secondary | ICD-10-CM

## 2016-04-05 DIAGNOSIS — C561 Malignant neoplasm of right ovary: Secondary | ICD-10-CM

## 2016-04-05 DIAGNOSIS — I10 Essential (primary) hypertension: Secondary | ICD-10-CM

## 2016-04-05 DIAGNOSIS — E7800 Pure hypercholesterolemia, unspecified: Secondary | ICD-10-CM

## 2016-04-05 DIAGNOSIS — Z1501 Genetic susceptibility to malignant neoplasm of breast: Secondary | ICD-10-CM

## 2016-04-05 DIAGNOSIS — C569 Malignant neoplasm of unspecified ovary: Secondary | ICD-10-CM

## 2016-04-05 DIAGNOSIS — K219 Gastro-esophageal reflux disease without esophagitis: Secondary | ICD-10-CM

## 2016-04-05 DIAGNOSIS — Z79899 Other long term (current) drug therapy: Secondary | ICD-10-CM

## 2016-04-05 LAB — CBC WITH DIFFERENTIAL/PLATELET
BASOS ABS: 0 10*3/uL (ref 0–0.1)
BASOS PCT: 1 %
Eosinophils Absolute: 0.1 10*3/uL (ref 0–0.7)
Eosinophils Relative: 1 %
HCT: 32 % — ABNORMAL LOW (ref 35.0–47.0)
HEMOGLOBIN: 10.9 g/dL — AB (ref 12.0–16.0)
Lymphocytes Relative: 32 %
Lymphs Abs: 3 10*3/uL (ref 1.0–3.6)
MCH: 30.7 pg (ref 26.0–34.0)
MCHC: 34 g/dL (ref 32.0–36.0)
MCV: 90.2 fL (ref 80.0–100.0)
MONO ABS: 0.9 10*3/uL (ref 0.2–0.9)
MONOS PCT: 10 %
NEUTROS ABS: 5.5 10*3/uL (ref 1.4–6.5)
Neutrophils Relative %: 58 %
Platelets: 227 10*3/uL (ref 150–440)
RBC: 3.55 MIL/uL — AB (ref 3.80–5.20)
RDW: 17.2 % — ABNORMAL HIGH (ref 11.5–14.5)
WBC: 9.6 10*3/uL (ref 3.6–11.0)

## 2016-04-05 LAB — COMPREHENSIVE METABOLIC PANEL
ALBUMIN: 3.5 g/dL (ref 3.5–5.0)
ALK PHOS: 86 U/L (ref 38–126)
ALT: 27 U/L (ref 14–54)
AST: 26 U/L (ref 15–41)
Anion gap: 5 (ref 5–15)
BUN: 12 mg/dL (ref 6–20)
CALCIUM: 8.8 mg/dL — AB (ref 8.9–10.3)
CO2: 28 mmol/L (ref 22–32)
CREATININE: 0.73 mg/dL (ref 0.44–1.00)
Chloride: 99 mmol/L — ABNORMAL LOW (ref 101–111)
GFR calc Af Amer: >60 mL/min (ref >60)
GFR calc non Af Amer: >60 mL/min (ref >60)
GLUCOSE: 129 mg/dL — AB (ref 65–99)
Potassium: 4.2 mmol/L (ref 3.5–5.1)
SODIUM: 132 mmol/L — AB (ref 135–145)
Total Bilirubin: 0.9 mg/dL (ref 0.3–1.2)
Total Protein: 6.9 g/dL (ref 6.5–8.1)

## 2016-04-05 LAB — MAGNESIUM: Magnesium: 1.6 mg/dL — ABNORMAL LOW (ref 1.7–2.4)

## 2016-04-05 NOTE — Progress Notes (Signed)
Patient would like to discuss with MD today about how many treatments she has left.

## 2016-04-06 ENCOUNTER — Inpatient Hospital Stay: Payer: Medicare Other

## 2016-04-06 VITALS — BP 90/61 | HR 92 | Temp 97.4°F

## 2016-04-06 DIAGNOSIS — C569 Malignant neoplasm of unspecified ovary: Secondary | ICD-10-CM

## 2016-04-06 DIAGNOSIS — C561 Malignant neoplasm of right ovary: Secondary | ICD-10-CM | POA: Diagnosis not present

## 2016-04-06 LAB — CA 125: CA 125: 4.7 U/mL (ref 0.0–38.1)

## 2016-04-06 MED ORDER — PACLITAXEL PROTEIN-BOUND CHEMO INJECTION 100 MG
80.0000 mg/m2 | Freq: Once | INTRAVENOUS | Status: AC
  Administered 2016-04-06: 125 mg via INTRAVENOUS
  Filled 2016-04-06: qty 25

## 2016-04-06 MED ORDER — HEPARIN SOD (PORK) LOCK FLUSH 100 UNIT/ML IV SOLN
INTRAVENOUS | Status: AC
  Filled 2016-04-06: qty 5

## 2016-04-06 MED ORDER — SODIUM CHLORIDE 0.9 % IJ SOLN
10.0000 mL | INTRAMUSCULAR | Status: DC | PRN
  Administered 2016-04-06: 10 mL
  Filled 2016-04-06: qty 10

## 2016-04-06 MED ORDER — SODIUM CHLORIDE 0.9 % IV SOLN
Freq: Once | INTRAVENOUS | Status: AC
  Administered 2016-04-06: 10:00:00 via INTRAVENOUS
  Filled 2016-04-06: qty 1000

## 2016-04-06 MED ORDER — PALONOSETRON HCL INJECTION 0.25 MG/5ML
0.2500 mg | Freq: Once | INTRAVENOUS | Status: AC
  Administered 2016-04-06: 0.25 mg via INTRAVENOUS

## 2016-04-06 MED ORDER — FOSAPREPITANT DIMEGLUMINE INJECTION 150 MG
Freq: Once | INTRAVENOUS | Status: AC
  Administered 2016-04-06: 11:00:00 via INTRAVENOUS
  Filled 2016-04-06: qty 5

## 2016-04-06 MED ORDER — CARBOPLATIN CHEMO INJECTION 450 MG/45ML
306.0000 mg | Freq: Once | INTRAVENOUS | Status: AC
  Administered 2016-04-06: 310 mg via INTRAVENOUS
  Filled 2016-04-06: qty 31

## 2016-04-06 MED ORDER — HEPARIN SOD (PORK) LOCK FLUSH 100 UNIT/ML IV SOLN
500.0000 [IU] | Freq: Once | INTRAVENOUS | Status: AC | PRN
  Administered 2016-04-06: 500 [IU]

## 2016-04-07 ENCOUNTER — Encounter: Payer: Self-pay | Admitting: Oncology

## 2016-04-07 NOTE — Progress Notes (Signed)
[place] @ [facility] Telephone:(336) [phone]  Fax:([phone]) [place] OB: 07-29-1929  MR#: [id number]  RAQ#:[id number]  Patient Care Team: Albina Billet, MD as PCP - General (Internal Medicine) [facility] Gaetana Michaelis, MD as Referring Physician (Obstetrics and Gynecology) Maceo Pro, MD as Referring Physician (Obstetrics and Gynecology)  CHIEF COMPLAINT: (oncology history)  Carcinoma of ovary status post exploratory laparotomy and biopsy Extensive disease stage IIIc T IIIcNX M0 tumor Suboptimal debulking (September, 2016) . BRCA mutation was negative (germline) BRCA mutation is positive (somatic mutation in tumor tissue). Patient started back on carboplatinum and Taxol (day 1, 8, 15) from April 4: 017) cycle 5. Patient is here for ongoing evaluation and overall gaining strength.  No tingling.  No numbness.  Appetite remains stable. No chills.  No fever.  No abdominal pain.   Patient would like to discuss with MD today about how many treatments she has left.      VISIT DIAGNOSIS:  Ovarian cancer   ICD-9-CM ICD-10-CM   1. Ovarian cancer, unspecified laterality ([place]) 183.0 C56.9 Comprehensive metabolic panel      No history exists.      Review of system  general status: Patient is feeling weak and tired.  No change in a performance status.  No chills.  No fever.  Tolerated weekly Taxol much better.  No nausea.  No vomiting.  No tingling.  No numbness. HEENT:  No evidence of stomatitis Lungs: No cough or shortness of breath Cardiac: No chest pain or paroxysmal nocturnal dyspnea GI: No nausea no vomiting no diarrhea no abdominal pain Abdominal wound is healing well. Skin: No rash Lower extremity no swelling Neurological system: No tingling.  No numbness.  No other focal signs Musculoskeletal system no bony pains  PAST MEDICAL HISTORY: Past Medical History  Diagnosis Date  . Glaucoma   . GERD (gastroesophageal reflux disease)   .  Hypercholesteremia   . PONV (postoperative nausea and vomiting)   . Hypertension   . Anxiety   . Cancer [facility])     PAST SURGICAL HISTORY: Past Surgical History  Procedure Laterality Date  . Abdominal hysterectomy    . Replacement total knee      Right  . Breast biopsy    . Hand surgery      Right  . Laparotomy Left 07/20/2015    Procedure: LAPAROTOMY with Left Salpingooophrectomy;  Surgeon: Honor Loh Ward, MD;  Location: [facility] ORS;  Service: Gynecology;  Laterality: Left;  . Laparotomy N/A 07/20/2015    Procedure: LAPAROTOMY;  Surgeon: Gillis Ends, MD;  Location: [facility] ORS;  Service: Gynecology;  Laterality: N/A;  . Salpingoophorectomy Right 07/20/2015    Procedure: SALPINGO OOPHORECTOMY;  Surgeon: Gillis Ends, MD;  Location: [facility] ORS;  Service: Gynecology;  Laterality: Right;  . Peripheral vascular catheterization N/A 08/10/2015    Procedure: Glori Luis Cath Insertion;  Surgeon: Algernon Huxley, MD;  Location: [place] [facility];  Service: Cardiovascular;  Laterality: N/A;  . Exploratory laparotomy w/ bowel resection  01/11/2016    XL optimal debulking rectosigmoid resection and reanastomosis    FAMILY HISTORY No family history on file. There is no significant family history of breast cancer, ovarian cancer, colon cancer       ADVANCED DIRECTIVES:  Patient does have advance healthcare directive, Patient   does not desire to make any changes  HEALTH MAINTENANCE: Social History  Substance Use Topics  . Smoking status: Never Smoker   . Smokeless  tobacco: Never Used  . Alcohol Use: No      Allergies  Allergen Reactions  . Clindamycin/Lincomycin Diarrhea and Other (See Comments)    Reaction:  Antibiotic colitis   . Codeine Anxiety    Current Outpatient Prescriptions  Medication Sig Dispense Refill  . acetaminophen (TYLENOL) 500 MG tablet Take 500-1,000 mg by mouth every 6 (six) hours as needed for mild pain.     . bimatoprost (LUMIGAN) 0.01 % SOLN Place 1  drop into both eyes at bedtime.    . fluticasone (FLONASE) 50 MCG/ACT nasal spray Place 2 sprays into both nostrils daily as needed for rhinitis.     Marland Kitchen ibuprofen (ADVIL,MOTRIN) 200 MG tablet Take 400 mg by mouth every 6 (six) hours as needed for mild pain.    Marland Kitchen lidocaine-prilocaine (EMLA) cream Apply 1 application topically as needed. (Patient taking differently: Apply 1 application topically as needed (prior to accessing port). ) 30 g 3  . LORazepam (ATIVAN) 0.5 MG tablet Take 0.5 mg by mouth 2 (two) times daily.     . meloxicam (MOBIC) 15 MG tablet Take 15 mg by mouth daily.     Marland Kitchen menthol-cetylpyridinium (CEPACOL) 3 MG lozenge Take 1 lozenge (3 mg total) by mouth every 2 (two) hours as needed for sore throat. 100 tablet 12  . omeprazole (PRILOSEC) 20 MG capsule Take 20 mg by mouth daily.     . ondansetron (ZOFRAN) 4 MG tablet Take 1 tablet (4 mg total) by mouth every 6 (six) hours as needed for nausea. 30 tablet 3  . simvastatin (ZOCOR) 20 MG tablet Take 20 mg by mouth at bedtime.     . traMADol (ULTRAM) 50 MG tablet Take 1 tablet (50 mg total) by mouth every 6 (six) hours. 65 tablet 0  . vitamin C (ASCORBIC ACID) 500 MG tablet Take 500 mg by mouth daily.     No current facility-administered medications for this visit.   Facility-Administered Medications Ordered in Other Visits  Medication Dose Route Frequency Provider Last Rate Last Dose  . diphenhydrAMINE (BENADRYL) injection 12.5 mg  12.5 mg Intravenous Once Forest Gleason, MD      . methylPREDNISolone sodium succinate (SOLU-MEDROL) 125 mg/2 mL injection 100 mg  100 mg Intravenous Once Forest Gleason, MD        OBJECTIVE: PHYSICAL EXAM: Gen. status: Patient is alert oriented not any acute distress.. Lymphatic system: Supraclavicular, cervical, axillary, inguinal lymph nodes are not palpable Cardiac exam revealed the PMI to be normally situated and sized. The rhythm was regular and no extrasystoles were noted during several minutes of  auscultation. The first and second heart sounds were normal and physiologic splitting of the second heart sound was noted. There were no murmurs, rubs, clicks, or gallops. Examination of the chest was unremarkable. There were no bony deformities, no asymmetry, and no other abnormalities. Abdomen: Soft no ascites no palpable masses.  Abdominal wound has healed. Examination of the skin revealed no evidence of significant rashes, suspicious appearing nevi or other concerning lesions. Neurologically, the patient was awake, alert, and oriented to person, place and time. There were no obvious focal neurologic abnormalities.  Lower extremity no swelling Musculoskeletal system within normal limit   Filed Vitals:   04/05/16 1150  BP: 112/73  Pulse: 96  Temp: 96.9 F (36.1 C)  Resp: 18     Body mass index is 23.08 kg/(m^2).    ECOG FS:1 - Symptomatic but completely ambulatory  LAB RESULTS:  Appointment on 04/05/2016  Component  Date Value Ref Range Status  . Sodium 04/05/2016 132* 135 - 145 mmol/L Final  . Potassium 04/05/2016 4.2  3.5 - 5.1 mmol/L Final  . Chloride 04/05/2016 99* 101 - 111 mmol/L Final  . CO2 04/05/2016 28  22 - 32 mmol/L Final  . Glucose, Bld 04/05/2016 129* 65 - 99 mg/dL Final  . BUN 04/05/2016 12  6 - 20 mg/dL Final  . Creatinine, Ser 04/05/2016 0.73  0.44 - 1.00 mg/dL Final  . Calcium 04/05/2016 8.8* 8.9 - 10.3 mg/dL Final  . Total Protein 04/05/2016 6.9  6.5 - 8.1 g/dL Final  . Albumin 04/05/2016 3.5  3.5 - 5.0 g/dL Final  . AST 04/05/2016 26  15 - 41 U/L Final  . ALT 04/05/2016 27  14 - 54 U/L Final  . Alkaline Phosphatase 04/05/2016 86  38 - 126 U/L Final  . Total Bilirubin 04/05/2016 0.9  0.3 - 1.2 mg/dL Final  . GFR calc non Af Amer 04/05/2016 >60  >60 mL/min Final  . GFR calc Af Amer 04/05/2016 >60  >60 mL/min Final   Comment: (NOTE) The eGFR has been calculated using the CKD EPI equation. This calculation has not been validated in all clinical  situations. eGFR's persistently <60 mL/min signify possible Chronic Kidney Disease.   . Anion gap 04/05/2016 5  5 - 15 Final  . Magnesium 04/05/2016 1.6* 1.7 - 2.4 mg/dL Final  . CA 125 04/05/2016 4.7  0.0 - 38.1 U/mL Final   Comment: (NOTE) [employer] ECLIA methodology Performed At: [facility] [place], [place] [id number] Lindon Romp MD XN:[id number]   . WBC 04/05/2016 9.6  3.6 - 11.0 K/uL Final  . RBC 04/05/2016 3.55* 3.80 - 5.20 MIL/uL Final  . Hemoglobin 04/05/2016 10.9* 12.0 - 16.0 g/dL Final  . HCT 04/05/2016 32.0* 35.0 - 47.0 % Final  . MCV 04/05/2016 90.2  80.0 - 100.0 fL Final  . MCH 04/05/2016 30.7  26.0 - 34.0 pg Final  . MCHC 04/05/2016 34.0  32.0 - 36.0 g/dL Final  . RDW 04/05/2016 17.2* 11.5 - 14.5 % Final  . Platelets 04/05/2016 227  150 - 440 K/uL Final  . Neutrophils Relative % 04/05/2016 58   Final  . Neutro Abs 04/05/2016 5.5  1.4 - 6.5 K/uL Final  . Lymphocytes Relative 04/05/2016 32   Final  . Lymphs Abs 04/05/2016 3.0  1.0 - 3.6 K/uL Final  . Monocytes Relative 04/05/2016 10   Final  . Monocytes Absolute 04/05/2016 0.9  0.2 - 0.9 K/uL Final  . Eosinophils Relative 04/05/2016 1   Final  . Eosinophils Absolute 04/05/2016 0.1  0 - 0.7 K/uL Final  . Basophils Relative 04/05/2016 1   Final  . Basophils Absolute 04/05/2016 0.0  0 - 0.1 K/uL Final      ASSESSMENT:  Cancer of ovary status post suboptimal debulking surgery because of extensive disease stage III All lab data has been reviewed.  We will proceed with the fifth cycle of chemotherapy.  We might help to drop to 6 cycles with patient continues to have somewhat poor tolerance to carboplatinum. I will discuss situation with GYN oncologist.   No matching staging information was found for the patient.  Forest Gleason, MD   04/07/2016 8:04 AM

## 2016-04-13 ENCOUNTER — Inpatient Hospital Stay: Payer: Medicare Other

## 2016-04-13 ENCOUNTER — Telehealth: Payer: Self-pay | Admitting: *Deleted

## 2016-04-13 ENCOUNTER — Encounter: Payer: Self-pay | Admitting: *Deleted

## 2016-04-13 VITALS — BP 103/67 | HR 97 | Temp 98.6°F | Resp 18

## 2016-04-13 DIAGNOSIS — C561 Malignant neoplasm of right ovary: Secondary | ICD-10-CM | POA: Diagnosis not present

## 2016-04-13 DIAGNOSIS — C569 Malignant neoplasm of unspecified ovary: Secondary | ICD-10-CM

## 2016-04-13 LAB — CBC WITH DIFFERENTIAL/PLATELET
BASOS PCT: 1 %
Basophils Absolute: 0 10*3/uL (ref 0–0.1)
EOS ABS: 0.1 10*3/uL (ref 0–0.7)
EOS PCT: 1 %
HCT: 30.5 % — ABNORMAL LOW (ref 35.0–47.0)
HEMOGLOBIN: 10.6 g/dL — AB (ref 12.0–16.0)
Lymphocytes Relative: 24 %
Lymphs Abs: 2.1 10*3/uL (ref 1.0–3.6)
MCH: 31.1 pg (ref 26.0–34.0)
MCHC: 34.7 g/dL (ref 32.0–36.0)
MCV: 89.8 fL (ref 80.0–100.0)
Monocytes Absolute: 1.2 10*3/uL — ABNORMAL HIGH (ref 0.2–0.9)
Monocytes Relative: 14 %
NEUTROS PCT: 62 %
Neutro Abs: 5.4 10*3/uL (ref 1.4–6.5)
PLATELETS: 287 10*3/uL (ref 150–440)
RBC: 3.4 MIL/uL — AB (ref 3.80–5.20)
RDW: 16 % — ABNORMAL HIGH (ref 11.5–14.5)
WBC: 8.7 10*3/uL (ref 3.6–11.0)

## 2016-04-13 MED ORDER — SODIUM CHLORIDE 0.9% FLUSH
10.0000 mL | INTRAVENOUS | Status: DC | PRN
  Administered 2016-04-13: 10 mL via INTRAVENOUS
  Filled 2016-04-13: qty 10

## 2016-04-13 MED ORDER — SODIUM CHLORIDE 0.9 % IV SOLN
Freq: Once | INTRAVENOUS | Status: AC
  Administered 2016-04-13: 15:00:00 via INTRAVENOUS
  Filled 2016-04-13: qty 4

## 2016-04-13 MED ORDER — SODIUM CHLORIDE 0.9 % IV SOLN
Freq: Once | INTRAVENOUS | Status: AC
  Administered 2016-04-13: 14:00:00 via INTRAVENOUS
  Filled 2016-04-13: qty 1000

## 2016-04-13 MED ORDER — HEPARIN SOD (PORK) LOCK FLUSH 100 UNIT/ML IV SOLN
500.0000 [IU] | Freq: Once | INTRAVENOUS | Status: AC
  Administered 2016-04-13: 500 [IU] via INTRAVENOUS
  Filled 2016-04-13: qty 5

## 2016-04-13 MED ORDER — PACLITAXEL PROTEIN-BOUND CHEMO INJECTION 100 MG
80.0000 mg/m2 | Freq: Once | INTRAVENOUS | Status: AC
  Administered 2016-04-13: 125 mg via INTRAVENOUS
  Filled 2016-04-13: qty 25

## 2016-04-13 NOTE — Progress Notes (Signed)
Per MD, Dr. Oliva Bustard, order: CBC with Differential to be drawn today. Comprehensive Metabolic Panel does not need to be drawn today.

## 2016-04-13 NOTE — Telephone Encounter (Signed)
Called to inquire if she should keep her appt for chemo this afternoon. Reports she has had diarrhea all week, but it is better today. Per Dr Grayland Ormond, she should come in. Patient advised of this

## 2016-04-17 ENCOUNTER — Inpatient Hospital Stay: Payer: Medicare Other

## 2016-04-17 ENCOUNTER — Inpatient Hospital Stay
Admission: EM | Admit: 2016-04-17 | Discharge: 2016-04-20 | DRG: 872 | Disposition: A | Discharge status: Skilled Nursing Facility | Payer: Medicare Other | Attending: Internal Medicine | Admitting: Internal Medicine

## 2016-04-17 ENCOUNTER — Encounter: Payer: Self-pay | Admitting: *Deleted

## 2016-04-17 DIAGNOSIS — Z881 Allergy status to other antibiotic agents status: Secondary | ICD-10-CM

## 2016-04-17 DIAGNOSIS — Z79899 Other long term (current) drug therapy: Secondary | ICD-10-CM

## 2016-04-17 DIAGNOSIS — N390 Urinary tract infection, site not specified: Secondary | ICD-10-CM | POA: Diagnosis present

## 2016-04-17 DIAGNOSIS — Z7951 Long term (current) use of inhaled steroids: Secondary | ICD-10-CM | POA: Diagnosis not present

## 2016-04-17 DIAGNOSIS — E785 Hyperlipidemia, unspecified: Secondary | ICD-10-CM | POA: Diagnosis present

## 2016-04-17 DIAGNOSIS — E871 Hypo-osmolality and hyponatremia: Secondary | ICD-10-CM

## 2016-04-17 DIAGNOSIS — Z885 Allergy status to narcotic agent status: Secondary | ICD-10-CM

## 2016-04-17 DIAGNOSIS — F419 Anxiety disorder, unspecified: Secondary | ICD-10-CM | POA: Diagnosis present

## 2016-04-17 DIAGNOSIS — D899 Disorder involving the immune mechanism, unspecified: Secondary | ICD-10-CM | POA: Diagnosis present

## 2016-04-17 DIAGNOSIS — E7800 Pure hypercholesterolemia, unspecified: Secondary | ICD-10-CM | POA: Diagnosis present

## 2016-04-17 DIAGNOSIS — I10 Essential (primary) hypertension: Secondary | ICD-10-CM | POA: Diagnosis present

## 2016-04-17 DIAGNOSIS — N3090 Cystitis, unspecified without hematuria: Secondary | ICD-10-CM

## 2016-04-17 DIAGNOSIS — E860 Dehydration: Secondary | ICD-10-CM | POA: Diagnosis present

## 2016-04-17 DIAGNOSIS — A414 Sepsis due to anaerobes: Secondary | ICD-10-CM | POA: Diagnosis present

## 2016-04-17 DIAGNOSIS — E876 Hypokalemia: Secondary | ICD-10-CM | POA: Diagnosis present

## 2016-04-17 DIAGNOSIS — Z96651 Presence of right artificial knee joint: Secondary | ICD-10-CM | POA: Diagnosis present

## 2016-04-17 DIAGNOSIS — A419 Sepsis, unspecified organism: Secondary | ICD-10-CM

## 2016-04-17 DIAGNOSIS — H409 Unspecified glaucoma: Secondary | ICD-10-CM | POA: Diagnosis present

## 2016-04-17 DIAGNOSIS — A047 Enterocolitis due to Clostridium difficile: Secondary | ICD-10-CM | POA: Diagnosis present

## 2016-04-17 DIAGNOSIS — Z8543 Personal history of malignant neoplasm of ovary: Secondary | ICD-10-CM | POA: Diagnosis not present

## 2016-04-17 DIAGNOSIS — A0472 Enterocolitis due to Clostridium difficile, not specified as recurrent: Secondary | ICD-10-CM

## 2016-04-17 DIAGNOSIS — N179 Acute kidney failure, unspecified: Secondary | ICD-10-CM | POA: Diagnosis present

## 2016-04-17 DIAGNOSIS — R1084 Generalized abdominal pain: Secondary | ICD-10-CM

## 2016-04-17 DIAGNOSIS — K219 Gastro-esophageal reflux disease without esophagitis: Secondary | ICD-10-CM | POA: Diagnosis present

## 2016-04-17 DIAGNOSIS — A4181 Sepsis due to Enterococcus: Secondary | ICD-10-CM | POA: Diagnosis present

## 2016-04-17 DIAGNOSIS — N19 Unspecified kidney failure: Secondary | ICD-10-CM | POA: Diagnosis present

## 2016-04-17 LAB — C DIFFICILE QUICK SCREEN W PCR REFLEX
C DIFFICILE (CDIFF) INTERP: POSITIVE
C Diff antigen: POSITIVE — AB
C Diff toxin: POSITIVE — AB

## 2016-04-17 LAB — CBC WITH DIFFERENTIAL/PLATELET
Basophils Absolute: 0 10*3/uL (ref 0–0.1)
EOS ABS: 0 10*3/uL (ref 0–0.7)
Eosinophils Relative: 0 %
HCT: 28.9 % — ABNORMAL LOW (ref 35.0–47.0)
Hemoglobin: 10.1 g/dL — ABNORMAL LOW (ref 12.0–16.0)
Lymphocytes Relative: 9 %
Lymphs Abs: 1 10*3/uL (ref 1.0–3.6)
MCH: 31.4 pg (ref 26.0–34.0)
MCHC: 34.8 g/dL (ref 32.0–36.0)
MCV: 90.3 fL (ref 80.0–100.0)
MONO ABS: 0.5 10*3/uL (ref 0.2–0.9)
Neutro Abs: 9.4 10*3/uL — ABNORMAL HIGH (ref 1.4–6.5)
Platelets: 211 10*3/uL (ref 150–440)
RBC: 3.2 MIL/uL — ABNORMAL LOW (ref 3.80–5.20)
RDW: 15.6 % — AB (ref 11.5–14.5)
WBC: 10.9 10*3/uL (ref 3.6–11.0)

## 2016-04-17 LAB — GASTROINTESTINAL PANEL BY PCR, STOOL (REPLACES STOOL CULTURE)
ADENOVIRUS F40/41: NOT DETECTED
Astrovirus: NOT DETECTED
CRYPTOSPORIDIUM: NOT DETECTED
CYCLOSPORA CAYETANENSIS: NOT DETECTED
Campylobacter species: NOT DETECTED
E. COLI O157: NOT DETECTED
ENTAMOEBA HISTOLYTICA: NOT DETECTED
Enteroaggregative E coli (EAEC): NOT DETECTED
Enteropathogenic E coli (EPEC): NOT DETECTED
Enterotoxigenic E coli (ETEC): NOT DETECTED
Giardia lamblia: NOT DETECTED
Norovirus GI/GII: NOT DETECTED
Plesimonas shigelloides: NOT DETECTED
ROTAVIRUS A: NOT DETECTED
SAPOVIRUS (I, II, IV, AND V): NOT DETECTED
SHIGA LIKE TOXIN PRODUCING E COLI (STEC): NOT DETECTED
Salmonella species: NOT DETECTED
Shigella/Enteroinvasive E coli (EIEC): NOT DETECTED
VIBRIO CHOLERAE: NOT DETECTED
VIBRIO SPECIES: NOT DETECTED
YERSINIA ENTEROCOLITICA: NOT DETECTED

## 2016-04-17 LAB — URINALYSIS COMPLETE WITH MICROSCOPIC (ARMC ONLY)
BILIRUBIN URINE: NEGATIVE
GLUCOSE, UA: NEGATIVE mg/dL
HGB URINE DIPSTICK: NEGATIVE
Leukocytes, UA: NEGATIVE
NITRITE: POSITIVE — AB
Protein, ur: 100 mg/dL — AB
Specific Gravity, Urine: 1.02 (ref 1.005–1.030)
pH: 5 (ref 5.0–8.0)

## 2016-04-17 LAB — MAGNESIUM: MAGNESIUM: 1.6 mg/dL — AB (ref 1.7–2.4)

## 2016-04-17 LAB — COMPREHENSIVE METABOLIC PANEL
ALT: 17 U/L (ref 14–54)
ANION GAP: 12 (ref 5–15)
AST: 21 U/L (ref 15–41)
Albumin: 2.4 g/dL — ABNORMAL LOW (ref 3.5–5.0)
Alkaline Phosphatase: 72 U/L (ref 38–126)
BUN: 20 mg/dL (ref 6–20)
CALCIUM: 7.7 mg/dL — AB (ref 8.9–10.3)
CHLORIDE: 89 mmol/L — AB (ref 101–111)
CO2: 26 mmol/L (ref 22–32)
CREATININE: 0.74 mg/dL (ref 0.44–1.00)
Glucose, Bld: 115 mg/dL — ABNORMAL HIGH (ref 65–99)
Potassium: 3.1 mmol/L — ABNORMAL LOW (ref 3.5–5.1)
Sodium: 127 mmol/L — ABNORMAL LOW (ref 135–145)
Total Bilirubin: 1.4 mg/dL — ABNORMAL HIGH (ref 0.3–1.2)
Total Protein: 6 g/dL — ABNORMAL LOW (ref 6.5–8.1)

## 2016-04-17 LAB — LACTIC ACID, PLASMA
LACTIC ACID, VENOUS: 1.4 mmol/L (ref 0.5–2.0)
LACTIC ACID, VENOUS: 1.8 mmol/L (ref 0.5–2.0)

## 2016-04-17 LAB — PHOSPHORUS: Phosphorus: 2.7 mg/dL (ref 2.5–4.6)

## 2016-04-17 LAB — PROCALCITONIN: Procalcitonin: 0.5 ng/mL

## 2016-04-17 LAB — LIPASE, BLOOD: LIPASE: 16 U/L (ref 11–51)

## 2016-04-17 MED ORDER — ENOXAPARIN SODIUM 40 MG/0.4ML ~~LOC~~ SOLN
40.0000 mg | SUBCUTANEOUS | Status: DC
  Administered 2016-04-17 – 2016-04-19 (×3): 40 mg via SUBCUTANEOUS
  Filled 2016-04-17 (×2): qty 0.4

## 2016-04-17 MED ORDER — LATANOPROST 0.005 % OP SOLN
1.0000 [drp] | Freq: Every day | OPHTHALMIC | Status: DC
  Administered 2016-04-17 – 2016-04-19 (×3): 1 [drp] via OPHTHALMIC
  Filled 2016-04-17: qty 2.5

## 2016-04-17 MED ORDER — ACETAMINOPHEN 650 MG RE SUPP: 650.0000 mg | Freq: Four times a day (QID) | RECTAL | Status: DC | PRN

## 2016-04-17 MED ORDER — SODIUM CHLORIDE 0.9 % IV BOLUS (SEPSIS)
1000.0000 mL | Freq: Once | INTRAVENOUS | Status: AC
  Administered 2016-04-17: 1000 mL via INTRAVENOUS

## 2016-04-17 MED ORDER — LORAZEPAM 0.5 MG PO TABS
ORAL_TABLET | ORAL | Status: AC
  Filled 2016-04-17: qty 1

## 2016-04-17 MED ORDER — CEFTRIAXONE SODIUM 1 G IJ SOLR
1.0000 g | INTRAMUSCULAR | Status: DC
  Administered 2016-04-18 – 2016-04-19 (×2): 1 g via INTRAVENOUS
  Filled 2016-04-17 (×3): qty 10

## 2016-04-17 MED ORDER — ONDANSETRON HCL 4 MG/2ML IJ SOLN: 4.0000 mg | Freq: Four times a day (QID) | INTRAMUSCULAR | Status: DC | PRN

## 2016-04-17 MED ORDER — FLUTICASONE PROPIONATE 50 MCG/ACT NA SUSP
2.0000 | Freq: Every day | NASAL | Status: DC | PRN
  Filled 2016-04-17: qty 16

## 2016-04-17 MED ORDER — LORAZEPAM 0.5 MG PO TABS
0.5000 mg | ORAL_TABLET | Freq: Two times a day (BID) | ORAL | Status: DC
  Administered 2016-04-17 – 2016-04-20 (×6): 0.5 mg via ORAL
  Filled 2016-04-17 (×4): qty 1

## 2016-04-17 MED ORDER — DEXTROSE 5 % IV SOLN
1.0000 g | Freq: Once | INTRAVENOUS | Status: AC
  Administered 2016-04-17: 1 g via INTRAVENOUS
  Filled 2016-04-17: qty 10

## 2016-04-17 MED ORDER — PANTOPRAZOLE SODIUM 40 MG PO TBEC
40.0000 mg | DELAYED_RELEASE_TABLET | Freq: Every day | ORAL | Status: DC
  Administered 2016-04-18 – 2016-04-20 (×3): 40 mg via ORAL
  Filled 2016-04-17 (×2): qty 1

## 2016-04-17 MED ORDER — ENOXAPARIN SODIUM 40 MG/0.4ML ~~LOC~~ SOLN: 40.0000 mg | SUBCUTANEOUS | Status: DC

## 2016-04-17 MED ORDER — ONDANSETRON HCL 4 MG PO TABS: 4.0000 mg | ORAL_TABLET | Freq: Four times a day (QID) | ORAL | Status: DC | PRN

## 2016-04-17 MED ORDER — SIMVASTATIN 20 MG PO TABS
20.0000 mg | ORAL_TABLET | Freq: Every day | ORAL | Status: DC
  Administered 2016-04-17 – 2016-04-19 (×3): 20 mg via ORAL
  Filled 2016-04-17 (×2): qty 1

## 2016-04-17 MED ORDER — ENOXAPARIN SODIUM 40 MG/0.4ML ~~LOC~~ SOLN
SUBCUTANEOUS | Status: AC
  Filled 2016-04-17: qty 0.4

## 2016-04-17 MED ORDER — TRAMADOL HCL 50 MG PO TABS: 50.0000 mg | ORAL_TABLET | Freq: Four times a day (QID) | ORAL | Status: DC

## 2016-04-17 MED ORDER — SODIUM CHLORIDE 0.9 % IV BOLUS (SEPSIS)
500.0000 mL | Freq: Once | INTRAVENOUS | Status: AC
  Administered 2016-04-17: 500 mL via INTRAVENOUS

## 2016-04-17 MED ORDER — MORPHINE SULFATE (PF) 2 MG/ML IV SOLN: 1.0000 mg | INTRAVENOUS | Status: DC | PRN

## 2016-04-17 MED ORDER — SODIUM CHLORIDE 0.9 % IV BOLUS (SEPSIS)
250.0000 mL | Freq: Once | INTRAVENOUS | Status: AC
  Administered 2016-04-17: 250 mL via INTRAVENOUS

## 2016-04-17 MED ORDER — POTASSIUM CHLORIDE CRYS ER 20 MEQ PO TBCR
40.0000 meq | EXTENDED_RELEASE_TABLET | Freq: Once | ORAL | Status: AC
  Administered 2016-04-17: 40 meq via ORAL
  Filled 2016-04-17: qty 2

## 2016-04-17 MED ORDER — METRONIDAZOLE IN NACL 5-0.79 MG/ML-% IV SOLN
500.0000 mg | Freq: Once | INTRAVENOUS | Status: DC
  Filled 2016-04-17: qty 100

## 2016-04-17 MED ORDER — POTASSIUM CHLORIDE IN NACL 20-0.9 MEQ/L-% IV SOLN
INTRAVENOUS | Status: DC
  Administered 2016-04-17 – 2016-04-19 (×5): via INTRAVENOUS
  Filled 2016-04-17 (×9): qty 1000

## 2016-04-17 MED ORDER — MAGNESIUM SULFATE 2 GM/50ML IV SOLN
2.0000 g | Freq: Once | INTRAVENOUS | Status: AC
  Administered 2016-04-17: 20:00:00 2 g via INTRAVENOUS
  Filled 2016-04-17: qty 50

## 2016-04-17 MED ORDER — ACETAMINOPHEN 325 MG PO TABS: 650.0000 mg | ORAL_TABLET | Freq: Four times a day (QID) | ORAL | Status: DC | PRN

## 2016-04-17 MED ORDER — DIATRIZOATE MEGLUMINE & SODIUM 66-10 % PO SOLN
15.0000 mL | ORAL | Status: AC
Start: 2016-04-17 — End: 2016-04-17
  Administered 2016-04-17: 15 mL via ORAL

## 2016-04-17 MED ORDER — SIMVASTATIN 20 MG PO TABS
ORAL_TABLET | ORAL | Status: AC
  Filled 2016-04-17: qty 1

## 2016-04-17 MED ORDER — VANCOMYCIN 50 MG/ML ORAL SOLUTION
250.0000 mg | Freq: Four times a day (QID) | ORAL | Status: DC
  Administered 2016-04-17 – 2016-04-20 (×12): 250 mg via ORAL
  Filled 2016-04-17 (×16): qty 5

## 2016-04-17 MED ORDER — IOPAMIDOL (ISOVUE-300) INJECTION 61%
100.0000 mL | Freq: Once | INTRAVENOUS | Status: AC | PRN
  Administered 2016-04-17: 100 mL via INTRAVENOUS

## 2016-04-17 MED ORDER — ONDANSETRON HCL 4 MG/2ML IJ SOLN
4.0000 mg | Freq: Once | INTRAMUSCULAR | Status: AC
  Administered 2016-04-17: 4 mg via INTRAVENOUS
  Filled 2016-04-17: qty 2

## 2016-04-17 MED ORDER — VITAMIN C 500 MG PO TABS
500.0000 mg | ORAL_TABLET | Freq: Every day | ORAL | Status: DC
  Administered 2016-04-18 – 2016-04-20 (×3): 500 mg via ORAL
  Filled 2016-04-17 (×2): qty 1

## 2016-04-17 MED ORDER — HYDROCODONE-ACETAMINOPHEN 5-325 MG PO TABS: 1.0000 | ORAL_TABLET | ORAL | Status: DC | PRN

## 2016-04-17 NOTE — ED Notes (Signed)
Patient's linen and gown changed due to spilling oral contrast on linens. Patient states she is unable to finish contrast. Dr. Joni Fears aware.

## 2016-04-17 NOTE — ED Notes (Signed)
Patient left for CT scan. 

## 2016-04-17 NOTE — H&P (Signed)
[place] [facility] [place] NAME: Kathryn Chandler    MR#:  [id number]  DATE OF BIRTH:  09/24/1929  DATE OF ADMISSION:  04/17/2016  PRIMARY CARE PHYSICIAN: Albina Billet, MD   REQUESTING/REFERRING PHYSICIAN: dr Joni Fears  CHIEF COMPLAINT:  Nausea vomiting diarrhea and not feeling well for last 3 days.  HISTORY OF PRESENT ILLNESS:  Kathryn Chandler  is a 82 y.o. female with a known history of Ovarian cancer status post chemotherapy last dose was on May 26, hypertension, anxiety comes to the emergency room with increasing nausea vomiting and diarrheal stools last 3 days. She feels weak had a couple falls at home which were nontraumatic. Patient also started having abdominal cramping. She had low-grade fever of 100.2. She is tachycardic. Her sodium is 127 and potassium is 3.1. She is being admitted with sepsis secondary to C. difficile colitis, UTI, acute renal failure with electrolyte abnormality.  PAST MEDICAL HISTORY:   Past Medical History  Diagnosis Date  . Glaucoma   . GERD (gastroesophageal reflux disease)   . Hypercholesteremia   . PONV (postoperative nausea and vomiting)   . Hypertension   . Anxiety   . Cancer ([place])   . Ovarian cancer ([place])     PAST SURGICAL HISTOIRY:   Past Surgical History  Procedure Laterality Date  . Abdominal hysterectomy    . Replacement total knee      Right  . Breast biopsy    . Hand surgery      Right  . Laparotomy Left 07/20/2015    Procedure: LAPAROTOMY with Left Salpingooophrectomy;  Surgeon: Honor Loh Ward, MD;  Location: [facility] ORS;  Service: Gynecology;  Laterality: Left;  . Laparotomy N/A 07/20/2015    Procedure: LAPAROTOMY;  Surgeon: Gillis Ends, MD;  Location: [facility] ORS;  Service: Gynecology;  Laterality: N/A;  . Salpingoophorectomy Right 07/20/2015    Procedure: SALPINGO OOPHORECTOMY;  Surgeon: Gillis Ends, MD;  Location: [facility] ORS;  Service: Gynecology;  Laterality: Right;  .  Peripheral vascular catheterization N/A 08/10/2015    Procedure: Glori Luis Cath Insertion;  Surgeon: Algernon Huxley, MD;  Location: [place] [facility];  Service: Cardiovascular;  Laterality: N/A;  . Exploratory laparotomy w/ bowel resection  01/11/2016    XL optimal debulking rectosigmoid resection and reanastomosis    SOCIAL HISTORY:   Social History  Substance Use Topics  . Smoking status: Never Smoker   . Smokeless tobacco: Never Used  . Alcohol Use: No    FAMILY HISTORY:   Family History  Problem Relation Age of Onset  . Stomach cancer Mother   . Osteoporosis Mother     DRUG ALLERGIES:   Allergies  Allergen Reactions  . Clindamycin/Lincomycin Diarrhea and Other (See Comments)    Reaction:  Antibiotic colitis   . Codeine Anxiety    REVIEW OF SYSTEMS:  Review of Systems  Constitutional: Positive for malaise/fatigue. Negative for fever, chills and weight loss.  HENT: Negative for ear discharge, ear pain and nosebleeds.   Eyes: Negative for blurred vision, pain and discharge.  Respiratory: Negative for sputum production, shortness of breath, wheezing and stridor.   Cardiovascular: Negative for chest pain, palpitations, orthopnea and PND.  Gastrointestinal: Positive for vomiting, abdominal pain and diarrhea. Negative for nausea.  Genitourinary: Negative for urgency and frequency.  Musculoskeletal: Negative for back pain and joint pain.  Neurological: Positive for weakness. Negative for sensory change, speech change and focal weakness.  Psychiatric/Behavioral: Negative for depression and hallucinations. The  patient is not nervous/anxious.   All other systems reviewed and are negative.    MEDICATIONS AT HOME:   Prior to Admission medications   Medication Sig Start Date End Date Taking? Authorizing Provider  acetaminophen (TYLENOL) 500 MG tablet Take 500-1,000 mg by mouth every 6 (six) hours as needed for mild pain.     Historical Provider, MD  bimatoprost (LUMIGAN) 0.01 %  SOLN Place 1 drop into both eyes at bedtime.    Historical Provider, MD  fluticasone (FLONASE) 50 MCG/ACT nasal spray Place 2 sprays into both nostrils daily as needed for rhinitis.     Historical Provider, MD  ibuprofen (ADVIL,MOTRIN) 200 MG tablet Take 400 mg by mouth every 6 (six) hours as needed for mild pain.    Historical Provider, MD  lidocaine-prilocaine (EMLA) cream Apply 1 application topically as needed. Patient taking differently: Apply 1 application topically as needed (prior to accessing port).  10/06/15   Forest Gleason, MD  LORazepam (ATIVAN) 0.5 MG tablet Take 0.5 mg by mouth 2 (two) times daily.     Historical Provider, MD  meloxicam (MOBIC) 15 MG tablet Take 15 mg by mouth daily.     Historical Provider, MD  menthol-cetylpyridinium (CEPACOL) 3 MG lozenge Take 1 lozenge (3 mg total) by mouth every 2 (two) hours as needed for sore throat. 07/24/15   Chelsea C Ward, MD  omeprazole (PRILOSEC) 20 MG capsule Take 20 mg by mouth daily.     Historical Provider, MD  ondansetron (ZOFRAN) 4 MG tablet Take 1 tablet (4 mg total) by mouth every 6 (six) hours as needed for nausea. 08/03/15   Forest Gleason, MD  simvastatin (ZOCOR) 20 MG tablet Take 20 mg by mouth at bedtime.     Historical Provider, MD  traMADol (ULTRAM) 50 MG tablet Take 1 tablet (50 mg total) by mouth every 6 (six) hours. 07/24/15   Chelsea C Ward, MD  vitamin C (ASCORBIC ACID) 500 MG tablet Take 500 mg by mouth daily.    Historical Provider, MD      VITAL SIGNS:  Blood pressure 110/63, pulse 113, temperature 100.9 F (38.3 C), temperature source Oral, resp. rate 18, height 4\' 11"  (1.499 m), weight 51.529 kg (113 lb 9.6 oz), SpO2 98 %.  PHYSICAL EXAMINATION:  GENERAL:  82 y.o.-year-old patient lying in the bed with no acute distress. weak EYES: Pupils equal, round, reactive to light and accommodation. No scleral icterus. Extraocular muscles intact.  HEENT: Head atraumatic, normocephalic. Oropharynx and nasopharynx clear.  Clinically dry NECK:  Supple, no jugular venous distention. No thyroid enlargement, no tenderness.  LUNGS: Normal breath sounds bilaterally, no wheezing, rales,rhonchi or crepitation. No use of accessory muscles of respiration. Port a cath ++ CARDIOVASCULAR: S1, S2 normal. No murmurs, rubs, or gallops.  ABDOMEN: Soft, nontender, nondistended. Bowel sounds present. No organomegaly or mass.  EXTREMITIES: No pedal edema, cyanosis, or clubbing.  NEUROLOGIC: Cranial nerves II through XII are intact. Muscle strength 5/5 in all extremities. Sensation intact. Gait not checked.  PSYCHIATRIC: The patient is alert and oriented x 3.  SKIN: No obvious rash, lesion, or ulcer.   LABORATORY PANEL:   CBC  Recent Labs Lab 04/17/16 1158  WBC 10.9  HGB 10.1*  HCT 28.9*  PLT 211   ------------------------------------------------------------------------------------------------------------------  Chemistries   Recent Labs Lab 04/17/16 1158  NA 127*  K 3.1*  CL 89*  CO2 26  GLUCOSE 115*  BUN 20  CREATININE 0.74  CALCIUM 7.7*  MG 1.6*  AST 21  ALT 17  ALKPHOS 72  BILITOT 1.4*    IMPRESSION AND PLAN:   Kathryn Chandler  is a 82 y.o. female with a known history of Ovarian cancer status post chemotherapy last dose was on May 26, hypertension, anxiety comes to the emergency room with increasing nausea vomiting and diarrheal stools last 3 days. She feels weak had a couple falls at home which were nontraumatic. Patient also started having abdominal cramping.  1.Sepsis due to C difficile diarrhea/ UTI -Patient presented with fever of 100.9 tachycardic heart rate in the 120s positive C. difficile positive your urine for UTI. -IV Rocephin -Oral vancomycin 250 mg every 6 hourly. We'll consider vancomycin given patient's immunocompromised state -Monitor blood cultures fever curve  2. Acute renal failure with clinical dehydration hypokalemia hyponatremia due to GI losses -Replete with IV  fluids -Replete metabolic panel -Pharmacy consult for electrolyte abnormality  3. Ovarian cancer status post chemotherapy last chemotherapy was May 26  4. Hyperlipidemia on simvastatin  5. DVT prophylaxis subcutaneous Lovenox    All the records are reviewed and case discussed with ED provider. Management plans discussed with the patient, family and they are in agreement.  CODE STATUS: full  TOTAL TIME TAKING CARE OF THIS PATIENT: 50 minutes.    Kathryn Chandler M.D on 04/17/2016 at 3:20 PM  Between 7am to 6pm - Pager - [phone]  After 6pm go to [email or web address] - password [email or web address] [facility]  Office  [phone]  CC: Primary care physician; Albina Billet, MD

## 2016-04-17 NOTE — ED Notes (Signed)
Per Patient's son, patient has had several days of diarrhea, weakness and poor PO intake. Patient is currently being treated for ovarian CA. Last chemo treatment was Friday, May 26.

## 2016-04-17 NOTE — Consult Note (Signed)
MEDICATION RELATED CONSULT NOTE - INITIAL   Pharmacy Consult for electrolytes Indication: hypokalemia/hypomagnesium  Allergies  Allergen Reactions  . Clindamycin/Lincomycin Diarrhea and Other (See Comments)    Reaction:  Antibiotic colitis   . Codeine Anxiety    Patient Measurements: Height: 4\' 11"  (149.9 cm) Weight: 113 lb 9.6 oz (51.529 kg) IBW/kg (Calculated) : 43.2 Adjusted Body Weight:   Vital Signs: Temp: 100.9 F (38.3 C) (05/30 1146) Temp Source: Oral (05/30 1146) BP: 110/63 mmHg (05/30 1438) Pulse Rate: 113 (05/30 1438) Intake/Output from previous day:   Intake/Output from this shift:    Labs:  Recent Labs  04/17/16 1158  WBC 10.9  HGB 10.1*  HCT 28.9*  PLT 211  CREATININE 0.74  MG 1.6*  PHOS 2.7  ALBUMIN 2.4*  PROT 6.0*  AST 21  ALT 17  ALKPHOS 72  BILITOT 1.4*   Estimated Creatinine Clearance: 35.1 mL/min (by C-G formula based on Cr of 0.74).   Microbiology: Recent Results (from the past 720 hour(s))  Gastrointestinal Panel by PCR , Stool     Status: None   Collection Time: 04/17/16 12:59 PM  Result Value Ref Range Status   Campylobacter species NOT DETECTED NOT DETECTED Final   Plesimonas shigelloides NOT DETECTED NOT DETECTED Final   Salmonella species NOT DETECTED NOT DETECTED Final   Yersinia enterocolitica NOT DETECTED NOT DETECTED Final   Vibrio species NOT DETECTED NOT DETECTED Final   Vibrio cholerae NOT DETECTED NOT DETECTED Final   Enteroaggregative E coli (EAEC) NOT DETECTED NOT DETECTED Final   Enteropathogenic E coli (EPEC) NOT DETECTED NOT DETECTED Final   Enterotoxigenic E coli (ETEC) NOT DETECTED NOT DETECTED Final   Shiga like toxin producing E coli (STEC) NOT DETECTED NOT DETECTED Final   E. coli O157 NOT DETECTED NOT DETECTED Final   Shigella/Enteroinvasive E coli (EIEC) NOT DETECTED NOT DETECTED Final   Cryptosporidium NOT DETECTED NOT DETECTED Final   Cyclospora cayetanensis NOT DETECTED NOT DETECTED Final   Entamoeba histolytica NOT DETECTED NOT DETECTED Final   Giardia lamblia NOT DETECTED NOT DETECTED Final   Adenovirus F40/41 NOT DETECTED NOT DETECTED Final   Astrovirus NOT DETECTED NOT DETECTED Final   Norovirus GI/GII NOT DETECTED NOT DETECTED Final   Rotavirus A NOT DETECTED NOT DETECTED Final   Sapovirus (I, II, IV, and V) NOT DETECTED NOT DETECTED Final  C difficile quick scan w PCR reflex     Status: Abnormal   Collection Time: 04/17/16 12:59 PM  Result Value Ref Range Status   C Diff antigen POSITIVE (A) NEGATIVE Final   C Diff toxin POSITIVE (A) NEGATIVE Final   C Diff interpretation   Final    Positive for toxigenic C. difficile, active toxin production present.    Comment: CRITICAL RESULT CALLED TO, READ BACK BY AND VERIFIED WITH: [facility] WEAVER 04/17/16 AT 1448 VKB     Medical History: Past Medical History  Diagnosis Date  . Glaucoma   . GERD (gastroesophageal reflux disease)   . Hypercholesteremia   . PONV (postoperative nausea and vomiting)   . Hypertension   . Anxiety   . Cancer ([place])   . Ovarian cancer ([facility])     Medications:  Scheduled:  . potassium chloride  40 mEq Oral Once  . vancomycin  250 mg Oral Q6H    Assessment: Pt is a 82 year old female who presents with n/v/d x 3 days. Pt is cdiff positive. K =3.1, mg=1.6, phos =2.7  Goal of Therapy:  K=3.5-5 Mg=1.7-2.4  Plan:  Will give KCL 40 MEQ po once. Will also give Mg 2g IV once. Recheck electrolytes in the AM.   Ramond Dial, Pharm.D Clinical Pharmacist   04/17/2016,4:24 PM

## 2016-04-17 NOTE — ED Provider Notes (Signed)
[facility] Department Provider Note  ____________________________________________  Time seen: 12:40 PM  I have reviewed the triage vital signs and the nursing notes.   HISTORY  Chief Complaint Diarrhea  Level 5 caveat:  Portions of the history and physical were unable to be obtained due to the patient's acute illness Portions of history obtained from family  HPI Kathryn Chandler is a 82 y.o. female brought to the ED due to generalized weakness, poor oral intake, and diarrhea for the last 4 days. She has a history of ovarian cancer, on chemotherapy, status post hysterectomy and BSO. Chemotherapy was 4 days ago. No vomiting. No chest pain shortness of breath.     Past Medical History  Diagnosis Date  . Glaucoma   . GERD (gastroesophageal reflux disease)   . Hypercholesteremia   . PONV (postoperative nausea and vomiting)   . Hypertension   . Anxiety   . Cancer ([place])   . Ovarian cancer [facility])      Patient Active Problem List   Diagnosis Date Noted  . Renal failure 04/17/2016  . Severe protein-calorie malnutrition Altamease Oiler: less than 60% of standard weight) ([place]) 11/07/2015  . Essential (primary) hypertension 11/05/2015  . Malignant neoplasm of ovary ([place]) 11/05/2015  . Colovaginal fistula 11/04/2015  . Acid reflux 08/03/2015  . Bilateral cataracts 08/03/2015  . Chicken pox 08/03/2015  . Allergic state 08/03/2015  . Breast lump 08/03/2015  . Hypercholesteremia 08/03/2015  . Malignant melanoma ([place]) 08/03/2015  . Ovarian cancer ([place]) 07/20/2015  . Head or neck swelling, mass, or lump 08/17/2014     Past Surgical History  Procedure Laterality Date  . Abdominal hysterectomy    . Replacement total knee      Right  . Breast biopsy    . Hand surgery      Right  . Laparotomy Left 07/20/2015    Procedure: LAPAROTOMY with Left Salpingooophrectomy;  Surgeon: Honor Loh Ward, MD;  Location: [facility] ORS;  Service: Gynecology;  Laterality: Left;   . Laparotomy N/A 07/20/2015    Procedure: LAPAROTOMY;  Surgeon: Gillis Ends, MD;  Location: [facility] ORS;  Service: Gynecology;  Laterality: N/A;  . Salpingoophorectomy Right 07/20/2015    Procedure: SALPINGO OOPHORECTOMY;  Surgeon: Gillis Ends, MD;  Location: [facility] ORS;  Service: Gynecology;  Laterality: Right;  . Peripheral vascular catheterization N/A 08/10/2015    Procedure: Glori Luis Cath Insertion;  Surgeon: Algernon Huxley, MD;  Location: [place] CV LAB;  Service: Cardiovascular;  Laterality: N/A;  . Exploratory laparotomy w/ bowel resection  01/11/2016    XL optimal debulking rectosigmoid resection and reanastomosis     Current Outpatient Rx  Name  Route  Sig  Dispense  Refill  . acetaminophen (TYLENOL) 500 MG tablet   Oral   Take 500-1,000 mg by mouth every 6 (six) hours as needed for mild pain.          . bimatoprost (LUMIGAN) 0.01 % SOLN   Both Eyes   Place 1 drop into both eyes at bedtime.         . fluticasone (FLONASE) 50 MCG/ACT nasal spray   Each Nare   Place 2 sprays into both nostrils daily as needed for rhinitis.          Marland Kitchen ibuprofen (ADVIL,MOTRIN) 200 MG tablet   Oral   Take 400 mg by mouth every 6 (six) hours as needed for mild pain.         Marland Kitchen lidocaine-prilocaine (EMLA) cream  Topical   Apply 1 application topically as needed. Patient taking differently: Apply 1 application topically as needed (prior to accessing port).    30 g   3   . LORazepam (ATIVAN) 0.5 MG tablet   Oral   Take 0.5 mg by mouth 2 (two) times daily.          . meloxicam (MOBIC) 15 MG tablet   Oral   Take 15 mg by mouth daily.          Marland Kitchen menthol-cetylpyridinium (CEPACOL) 3 MG lozenge   Oral   Take 1 lozenge (3 mg total) by mouth every 2 (two) hours as needed for sore throat.   100 tablet   12   . omeprazole (PRILOSEC) 20 MG capsule   Oral   Take 20 mg by mouth daily.          . ondansetron (ZOFRAN) 4 MG tablet   Oral   Take 1 tablet (4 mg  total) by mouth every 6 (six) hours as needed for nausea.   30 tablet   3   . simvastatin (ZOCOR) 20 MG tablet   Oral   Take 20 mg by mouth at bedtime.          . traMADol (ULTRAM) 50 MG tablet   Oral   Take 1 tablet (50 mg total) by mouth every 6 (six) hours.   65 tablet   0   . vitamin C (ASCORBIC ACID) 500 MG tablet   Oral   Take 500 mg by mouth daily.            Allergies Clindamycin/lincomycin and Codeine   No family history on file.  Social History Social History  Substance Use Topics  . Smoking status: Never Smoker   . Smokeless tobacco: Never Used  . Alcohol Use: No    Review of Systems  Constitutional:   No fever or chills.  Eyes:   No vision changes.  ENT:   No sore throat. No rhinorrhea. Cardiovascular:   No chest pain. Respiratory:   No dyspnea or cough. Gastrointestinal:   Positive abdominal pain and diarrhea.  Genitourinary:   Negative for dysuria or difficulty urinating. Musculoskeletal:   Negative for focal pain or swelling Neurological:   Negative for headaches 10-point ROS otherwise negative.  ____________________________________________   PHYSICAL EXAM:  VITAL SIGNS: ED Triage Vitals  Enc Vitals Group     BP 04/17/16 1146 116/57 mmHg     Pulse Rate 04/17/16 1146 124     Resp 04/17/16 1146 20     Temp 04/17/16 1146 100.9 F (38.3 C)     Temp Source 04/17/16 1146 Oral     SpO2 04/17/16 1146 97 %     Weight 04/17/16 1146 113 lb 9.6 oz (51.529 kg)     Height 04/17/16 1146 4\' 11"  (1.499 m)     Head Cir --      Peak Flow --      Pain Score 04/17/16 1150 0     Pain Loc --      Pain Edu? --      Excl. in [place]? --     Vital signs reviewed, nursing assessments reviewed.   Constitutional:   Alert and oriented. Ill-appearing. Eyes:   No scleral icterus. No conjunctival pallor. PERRL. EOMI.  No nystagmus. ENT   Head:   Normocephalic and atraumatic.   Nose:   No congestion/rhinnorhea. No septal hematoma   Mouth/Throat:    Dry mucous membranes, no  pharyngeal erythema. No peritonsillar mass.    Neck:   No stridor. No SubQ emphysema. No meningismus. Hematological/Lymphatic/Immunilogical:   No cervical lymphadenopathy. Cardiovascular:   RRR. Symmetric bilateral radial and DP pulses.  No murmurs.  Respiratory:   Normal respiratory effort without tachypnea nor retractions. Breath sounds are clear and equal bilaterally. No wheezes/rales/rhonchi. Gastrointestinal:   Soft generalized tenderness. Non distended. There is no CVA tenderness.  No rebound, rigidity, or guarding. Genitourinary:   Unremarkable external genitalia perineum Musculoskeletal:   Nontender with normal range of motion in all extremities. No joint effusions.  No lower extremity tenderness.  No edema. Neurologic:   Normal speech and language.  CN 2-10 normal. Motor grossly intact. No gross focal neurologic deficits are appreciated.  Skin:    Skin is warm, dry and intact. No rash noted.  No petechiae, purpura, or bullae. Poor skin turgor  ____________________________________________    LABS (pertinent positives/negatives) (all labs ordered are listed, but only abnormal results are displayed) Labs Reviewed  C DIFFICILE QUICK SCREEN W PCR REFLEX - Abnormal; Notable for the following:    C Diff antigen POSITIVE (*)    C Diff toxin POSITIVE (*)    All other components within normal limits  COMPREHENSIVE METABOLIC PANEL - Abnormal; Notable for the following:    Sodium 127 (*)    Potassium 3.1 (*)    Chloride 89 (*)    Glucose, Bld 115 (*)    Calcium 7.7 (*)    Total Protein 6.0 (*)    Albumin 2.4 (*)    Total Bilirubin 1.4 (*)    All other components within normal limits  MAGNESIUM - Abnormal; Notable for the following:    Magnesium 1.6 (*)    All other components within normal limits  CBC WITH DIFFERENTIAL/PLATELET - Abnormal; Notable for the following:    RBC 3.20 (*)    Hemoglobin 10.1 (*)    HCT 28.9 (*)    RDW 15.6 (*)    Neutro  Abs 9.4 (*)    All other components within normal limits  URINALYSIS COMPLETEWITH MICROSCOPIC ([facility] ONLY) - Abnormal; Notable for the following:    Color, Urine AMBER (*)    APPearance CLOUDY (*)    Ketones, ur 1+ (*)    Protein, ur 100 (*)    Nitrite POSITIVE (*)    Bacteria, UA MANY (*)    Squamous Epithelial / LPF 0-5 (*)    All other components within normal limits  URINE CULTURE  GASTROINTESTINAL PANEL BY PCR, STOOL (REPLACES STOOL CULTURE)  CULTURE, BLOOD (ROUTINE X 2)  CULTURE, BLOOD (ROUTINE X 2)  LIPASE, BLOOD  PHOSPHORUS  LACTIC ACID, PLASMA  LACTIC ACID, PLASMA  PROCALCITONIN   ____________________________________________   EKG  Interpreted by me sinus tachycardia rate 124, normal axis and intervals. Normal QRS ST segments and T waves.  ____________________________________________    RADIOLOGY  CT abdomen pelvis pending  ____________________________________________   PROCEDURES CRITICAL CARE Performed by: Joni Fears, Preet Mangano   Total critical care time: 35 minutes  Critical care time was exclusive of separately billable procedures and treating other patients.  Critical care was necessary to treat or prevent imminent or life-threatening deterioration.  Critical care was time spent personally by me on the following activities: development of treatment plan with patient and/or surrogate as well as nursing, discussions with consultants, evaluation of patient's response to treatment, examination of patient, obtaining history from patient or surrogate, ordering and performing treatments and interventions, ordering and review of laboratory studies,  ordering and review of radiographic studies, pulse oximetry and re-evaluation of patient's condition.   ____________________________________________   INITIAL IMPRESSION / ASSESSMENT AND PLAN / ED COURSE  Pertinent labs & imaging results that were available during my care of the patient were reviewed by me and  considered in my medical decision making (see chart for details).  Patient presents with tachycardia fever general is abdominal pain and diarrhea. Appears to be severely dehydrated related to chemotherapy. We did CT scan due to her immunosuppression and abdominal tenderness as well. Initial workup is consistent with urinary tract infection which may be causing sepsis. Patient was ordered ceftriaxone IV for this. Since were already giving her ceftriaxone which will also cover enteric, I added on Flagyl in case she has an abdominal infectious source as well. Urine culture sent. At 2:20 PM with enough data back to suggest that the patient has early sepsis, we will start fluid resuscitation boluses per sepsis protocol including serial lactates. Plan for admission. Patient also has hyponatremia to 127 consistent with her dehydration.  ----------------------------------------- 3:04 PM on 04/17/2016 -----------------------------------------  C. difficile positive. Continue IV fluids and Flagyl for now. Case discussed with the hospitalist who will evaluate for admission.     ____________________________________________   FINAL CLINICAL IMPRESSION(S) / ED DIAGNOSES  Final diagnoses:  Generalized abdominal pain  Dehydration  Hyponatremia  Cystitis  C. difficile colitis  Sepsis     Portions of this note were generated with dragon dictation software. Dictation errors may occur despite best attempts at proofreading.   Carrie Mew, MD 04/17/16 [phone]

## 2016-04-18 LAB — BASIC METABOLIC PANEL
ANION GAP: 6 (ref 5–15)
BUN: 15 mg/dL (ref 6–20)
CALCIUM: 7 mg/dL — AB (ref 8.9–10.3)
CHLORIDE: 100 mmol/L — AB (ref 101–111)
CO2: 24 mmol/L (ref 22–32)
Creatinine, Ser: 0.61 mg/dL (ref 0.44–1.00)
GFR calc non Af Amer: >60 mL/min (ref >60)
Glucose, Bld: 110 mg/dL — ABNORMAL HIGH (ref 65–99)
POTASSIUM: 3.6 mmol/L (ref 3.5–5.1)
Sodium: 130 mmol/L — ABNORMAL LOW (ref 135–145)

## 2016-04-18 LAB — MAGNESIUM: MAGNESIUM: 2 mg/dL (ref 1.7–2.4)

## 2016-04-18 MED ORDER — LORAZEPAM 0.5 MG PO TABS
ORAL_TABLET | ORAL | Status: AC
  Filled 2016-04-18: qty 1

## 2016-04-18 MED ORDER — PANTOPRAZOLE SODIUM 40 MG PO TBEC
DELAYED_RELEASE_TABLET | ORAL | Status: AC
  Filled 2016-04-18: qty 1

## 2016-04-18 MED ORDER — VITAMIN C 500 MG PO TABS
ORAL_TABLET | ORAL | Status: AC
  Filled 2016-04-18: qty 1

## 2016-04-18 MED ORDER — BOOST / RESOURCE BREEZE PO LIQD
1.0000 | Freq: Three times a day (TID) | ORAL | Status: DC
  Administered 2016-04-18 – 2016-04-20 (×5): 1 via ORAL

## 2016-04-18 NOTE — Progress Notes (Signed)
Initial Nutrition Assessment  DOCUMENTATION CODES:   Severe malnutrition in context of chronic illness  INTERVENTION:   Await diet advancement as medically able Will send plastic utensils on meal trays Recommend Boost Breeze po TID, each supplement provides 250 kcal and 9 grams of protein   NUTRITION DIAGNOSIS:   Malnutrition related to chronic illness as evidenced by energy intake < or equal to 75% for > or equal to 1 month, moderate depletions of muscle mass, mild depletion of body fat, percent weight loss.  GOAL:   Patient will meet greater than or equal to 90% of their needs  MONITOR:   PO intake, Supplement acceptance, Diet advancement, Skin, Weight trends, Labs, I & O's  REASON FOR ASSESSMENT:   Malnutrition Screening Tool    ASSESSMENT:   Pt with h/o ovarian cancer currently on chemotherapy, last dose received Apr 13, 2016.  Pt admitted with n/v/d with sepsis secondary to UTI, c.diff, and acute renal failure.   Past Medical History  Diagnosis Date  . Glaucoma   . GERD (gastroesophageal reflux disease)   . Hypercholesteremia   . PONV (postoperative nausea and vomiting)   . Hypertension   . Anxiety   . Cancer ([place])   . Ovarian cancer [facility])     Diet Order:  Diet clear liquid Room service appropriate?: Yes; Fluid consistency:: Thin   Pt eating sips and bites of CL this am on visit. Pt reports having not eating hardly anything for the past few days secondary to n/v. Pt reports poor appetite and po intake for a long time, usually eating only one meal per day at best. Pt c/o dysgeusia secondary to chemo as well. Pt reports trying Ensure/Boost however felt that they made her nauseous even though her MDs really wanted her to try and drink them.   Medications: Protonix, Vitamin C, Ativan, NS with KCl at 126mL/hr Labs: Na 130, Mg 2.0, 1.6   Gastrointestinal Profile: Last BM:  04/18/2016 loose BM   Nutrition-Focused Physical Exam Findings: Nutrition-Focused  physical exam completed. Findings are mild-moderate fat depletion, moderate muscle depletion, and no edema.     Weight Change: Pt reports weight of 138lbs prior to getting sick which pt reports was in August 2016 (18% weight loss in 9 months) Per weight trend in [facility] progressive weight loss   Skin:  Reviewed, no issues   Height:   Ht Readings from Last 1 Encounters:  04/17/16 4\' 11"  (1.499 m)    Weight:   Wt Readings from Last 1 Encounters:  04/17/16 113 lb 9.6 oz (51.529 kg)   Wt Readings from Last 10 Encounters:  04/17/16 113 lb 9.6 oz (51.529 kg)  04/05/16 114 lb 5 oz (51.852 kg)  03/29/16 115 lb 5 oz (52.305 kg)  03/21/16 114 lb (51.71 kg)  03/06/16 113 lb 12.1 oz (51.6 kg)  02/29/16 117 lb 1 oz (53.1 kg)  02/10/16 113 lb 8 oz (51.483 kg)  11/04/15 120 lb (54.432 kg)  10/26/15 123 lb 0.3 oz (55.8 kg)  10/17/15 122 lb 5.7 oz (55.5 kg)    BMI:  Body mass index is 22.93 kg/(m^2).  Estimated Nutritional Needs:   Kcal:  1560-1820kcals  Protein:  62-78g protein  Fluid:  >/= 1.7L fluid  EDUCATION NEEDS:   Education needs no appropriate at this time  Dwyane Luo, RD, LDN Pager [phone] Weekend/On-Call Pager [phone]

## 2016-04-18 NOTE — Progress Notes (Signed)
Son updated on pt status by myself and Dr Posey Pronto

## 2016-04-18 NOTE — Progress Notes (Signed)
PT Cancellation Note  Patient Details Name: Kathryn Chandler MRN: [id number] DOB: [birth year]   Cancelled Treatment:    Reason Eval/Treat Not Completed: Patient declined, no reason specified;Fatigue/lethargy limiting ability to participate; "I am not refusing, I am declining.  I just need to rest right now."  Pt too tired to get up with PT this date, pt agreeable to later time.  Pt also states she is too weak to operate bed controls to lower bed, asking for assistance from PT.  Will return at a later time.  Anaysha Andre A [place]bury Hair, PT 04/18/2016, 9:53 AM

## 2016-04-18 NOTE — Care Management (Signed)
Admitted to this facility with renal failure. Lives alone. Son is [name] [phone]). Last seen Dr. Benita Stabile several months ago. [facility] in February. Home Health through [facility] or [facility]. [facility] in March 2017. Bedside commode and rolling walker in the home. No Life Alert. No home oxygen. Takes care of all basic needs herself, drives. [place] [facility] prior to this admission. Gets medications through [facility] or [facility] on [address]. Port in place. Chemotherapy last Thursday. Sees Dr. Jeb Levering at the [facility]. Son will transport. Shelbie Ammons RN MSN CCM Care Management [phone]

## 2016-04-18 NOTE — Progress Notes (Signed)
PT Cancellation Note  Patient Details Name: Kathryn Chandler MRN: [id number] DOB: Nov 16, 1929   Cancelled Treatment:    Reason Eval/Treat Not Completed: Patient declined, no reason specified;Fatigue/lethargy limiting ability to participate; Second attempt for PT evaluation, pt reports she is "hard headed" and not ready to get up.    Maycol Hoying A Finas Delone, PT 04/18/2016, 12:54 PM

## 2016-04-18 NOTE — Care Management Important Message (Signed)
Important Message  Patient Details  Name: Kathryn Chandler MRN: [id number] Date of Birth: 1929-08-20   Medicare Important Message Given:  Yes    Shelbie Ammons, RN 04/18/2016, 9:25 AM

## 2016-04-18 NOTE — Progress Notes (Signed)
Patient ID: Kathryn Chandler, female   DOB: 1929/02/21, 82 y.o.   MRN: [id number] [place] [facility] [address] NAME: Kathryn Chandler    MR#:  [id number]  DATE OF BIRTH:  [birth year]  SUBJECTIVE:  Patient overall feels better. Diarrhea slowing down. Feels very weak and fatigued. Tolerating clear liquid diet.  REVIEW OF SYSTEMS:   Review of Systems  Constitutional: Positive for malaise/fatigue. Negative for fever, chills and weight loss.  HENT: Negative for ear discharge, ear pain and nosebleeds.   Eyes: Negative for blurred vision, pain and discharge.  Respiratory: Negative for sputum production, shortness of breath, wheezing and stridor.   Cardiovascular: Negative for chest pain, palpitations, orthopnea and PND.  Gastrointestinal: Positive for diarrhea. Negative for nausea, vomiting and abdominal pain.  Genitourinary: Negative for urgency and frequency.  Musculoskeletal: Negative for back pain and joint pain.  Neurological: Positive for weakness. Negative for sensory change, speech change and focal weakness.  Psychiatric/Behavioral: Negative for depression and hallucinations. The patient is not nervous/anxious.   All other systems reviewed and are negative.  Tolerating Diet:cld Tolerating PT: pending  DRUG ALLERGIES:   Allergies  Allergen Reactions  . Clindamycin/Lincomycin Diarrhea and Other (See Comments)    Reaction:  Antibiotic colitis   . Codeine Anxiety    VITALS:  Blood pressure 107/56, pulse 110, temperature 99.3 F (37.4 C), temperature source Oral, resp. rate 17, height 4\' 11"  (1.499 m), weight 51.529 kg (113 lb 9.6 oz), SpO2 94 %.  PHYSICAL EXAMINATION:   Physical Exam  GENERAL:  82 y.o.-year-old patient lying in the bed with no acute distress. Appears weak and cachectic and thin EYES: Pupils equal, round, reactive to light and accommodation. No scleral icterus. Extraocular muscles intact.  HEENT: Head atraumatic,  normocephalic. Oropharynx and nasopharynx clear.  NECK:  Supple, no jugular venous distention. No thyroid enlargement, no tenderness.  LUNGS: Normal breath sounds bilaterally, no wheezing, rales, rhonchi. No use of accessory muscles of respiration.  CARDIOVASCULAR: S1, S2 normal. No murmurs, rubs, or gallops.  ABDOMEN: Soft, nontender, nondistended. Bowel sounds present. No organomegaly or mass.  EXTREMITIES: No cyanosis, clubbing or edema b/l.    NEUROLOGIC: Cranial nerves II through XII are intact. No focal Motor or sensory deficits b/l.   PSYCHIATRIC:  patient is alert and oriented x 3.  SKIN: No obvious rash, lesion, or ulcer.   LABORATORY PANEL:  CBC  Recent Labs Lab 04/17/16 1158  WBC 10.9  HGB 10.1*  HCT 28.9*  PLT 211    Chemistries   Recent Labs Lab 04/17/16 1158 04/18/16 0403  NA 127* 130*  K 3.1* 3.6  CL 89* 100*  CO2 26 24  GLUCOSE 115* 110*  BUN 20 15  CREATININE 0.74 0.61  CALCIUM 7.7* 7.0*  MG 1.6* 2.0  AST 21  --   ALT 17  --   ALKPHOS 72  --   BILITOT 1.4*  --    Cardiac Enzymes No results for input(s): TROPONINI in the last 168 hours. RADIOLOGY:  Ct Abdomen Pelvis W Contrast  04/17/2016  CLINICAL DATA:  Ovarian cancer on chemotherapy. Diarrhea, fatigue, and tachycardia. EXAM: CT ABDOMEN AND PELVIS WITH CONTRAST TECHNIQUE: Multidetector CT imaging of the abdomen and pelvis was performed using the standard protocol following bolus administration of intravenous contrast. CONTRAST:  118mL ISOVUE-300 IOPAMIDOL (ISOVUE-300) INJECTION 61% COMPARISON:  11/04/2015 FINDINGS: Lower chest and abdominal wall: Trace pleural effusions and mild dependent atelectasis. Mild cardiomegaly. Hepatobiliary: No focal liver abnormality.Calculi  seen in the fundus. No evidence of acute cholecystitis. Pancreas: Unremarkable. Spleen: Unremarkable. Adrenals/Urinary Tract: Negative adrenals. 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 There is right lower  pole renal cortical thinning. No hydronephrosis or urolithiasis. Unremarkable bladder. Reproductive:Hysterectomy and oophorectomy. Stomach/Bowel: Marked diffuse colonic wall thickening with mesocolonic fat inflammation. Thumb printing morphology on scout image. Colocolonic anastomosis at the upper rectum with no visible fistula today. No perforation or obstruction. No small bowel inflammation. Vascular/Lymphatic: No acute vascular abnormality. No retroperitoneal adenopathy. Peritoneal: Negative for ascites. Omental nodularity seen previously could be improved or obscured by acute inflammation along the colon. Musculoskeletal: Dextroscoliosis with lower lumbar disc and facet degeneration. IMPRESSION: 1. Pancolitis.  This pattern is oftenseen with C difficile. 2. Ovarian cancer with omental metastases noted previously not clearly seen today. 3. Chronic findings are described above. Electronically Signed   By: Monte Fantasia M.D.   On: 04/17/2016 17:25   Dg Chest Port 1 View  04/17/2016  CLINICAL DATA:  Ovarian carcinoma. Generalized weakness and diarrhea EXAM: PORTABLE CHEST 1 VIEW COMPARISON:  Chest radiograph March 13, 2015 and chest CT November 04, 2015 FINDINGS: There is no edema or consolidation. Heart is upper normal in size with pulmonary vascularity within normal limits. There is atherosclerotic calcification in the aortic arch region. Port-A-Cath tip is in superior vena cava. No pneumothorax. No adenopathy. No bone lesions evident. IMPRESSION: No edema or consolidation. Electronically Signed   By: Lowella Grip III M.D.   On: 04/17/2016 15:42   ASSESSMENT AND PLAN:  Kathryn Chandler is a 82 y.o. female with a known history of Ovarian cancer status post chemotherapy last dose was on May 26, hypertension, anxiety comes to the emergency room with increasing nausea vomiting and diarrheal stools last 3 days. She feels weak had a couple falls at home which were nontraumatic. Patient also started having  abdominal cramping.  1.Sepsis due to C difficile diarrhea/ UTI -Patient presented with fever of 100.9 tachycardic heart rate in the 120s positive C. difficile positive your urine for UTI. -IV Rocephin -Oral vancomycin 250 mg every 6 hourly. We'll consider vancomycin given patient's immunocompromised state -Monitor blood cultures  -Monitor fever curve  -Urine culture grows 100,000 colony of gram-negative rods and 100,000 colonies of enterococcus final results pending  2. Acute renal failure with clinical dehydration hypokalemia hyponatremia due to GI losses -Replete with IV fluids -Replete metabolic panel -Pharmacy consult for electrolyte abnormality  3. Ovarian cancer status post chemotherapy last chemotherapy was May 26  4. Hyperlipidemia on simvastatin  5. DVT prophylaxis subcutaneous Lovenox  Case discussed with Care Management/Social Worker. Management plans discussed with the patient, family and they are in agreement.  CODE STATUS: Full  DVT Prophylaxis: Lovenox TOTAL TIME TAKING CARE OF THIS PATIENT: 30 minutes.  >50% time spent on counselling and coordination of care  POSSIBLE D/C IN one to 2 DAYS, DEPENDING ON CLINICAL CONDITION.  Note: This dictation was prepared with Dragon dictation along with smaller phrase technology. Any transcriptional errors that result from this process are unintentional.  Keyarra Rendall M.D on 04/18/2016 at 3:30 PM  Between 7am to 6pm - Pager - [phone]  After 6pm go to [email or web address] - password [email or web address] [place]  Office  [phone]  CC: Primary care physician; Albina Billet, MD

## 2016-04-18 NOTE — Consult Note (Signed)
MEDICATION RELATED CONSULT NOTE - INITIAL   Pharmacy Consult for electrolytes Indication: hypokalemia/hypomagnesium  Allergies  Allergen Reactions  . Clindamycin/Lincomycin Diarrhea and Other (See Comments)    Reaction:  Antibiotic colitis   . Codeine Anxiety   Patient Measurements: Height: 4\' 11"  (149.9 cm) Weight: 113 lb 9.6 oz (51.529 kg) IBW/kg (Calculated) : 43.2  Vital Signs: Temp: 99.3 F (37.4 C) (05/31 0504) Temp Source: Oral (05/30 2051) BP: 107/56 mmHg (05/31 0504) Pulse Rate: 110 (05/31 0504) Intake/Output from previous day: 05/30 0701 - 05/31 0700 In: 3365 [P.O.:240; I.V.:3075; IV Piggyback:50] Out: -  Intake/Output from this shift:    Labs:  Recent Labs  04/17/16 1158 04/18/16 0403  WBC 10.9  --   HGB 10.1*  --   HCT 28.9*  --   PLT 211  --   CREATININE 0.74 0.61  MG 1.6* 2.0  PHOS 2.7  --   ALBUMIN 2.4*  --   PROT 6.0*  --   AST 21  --   ALT 17  --   ALKPHOS 72  --   BILITOT 1.4*  --    Estimated Creatinine Clearance: 35.1 mL/min (by C-G formula based on Cr of 0.61).  Microbiology: Recent Results (from the past 720 hour(s))  Gastrointestinal Panel by PCR , Stool     Status: None   Collection Time: 04/17/16 12:59 PM  Result Value Ref Range Status   Campylobacter species NOT DETECTED NOT DETECTED Final   Plesimonas shigelloides NOT DETECTED NOT DETECTED Final   Salmonella species NOT DETECTED NOT DETECTED Final   Yersinia enterocolitica NOT DETECTED NOT DETECTED Final   Vibrio species NOT DETECTED NOT DETECTED Final   Vibrio cholerae NOT DETECTED NOT DETECTED Final   Enteroaggregative E coli (EAEC) NOT DETECTED NOT DETECTED Final   Enteropathogenic E coli (EPEC) NOT DETECTED NOT DETECTED Final   Enterotoxigenic E coli (ETEC) NOT DETECTED NOT DETECTED Final   Shiga like toxin producing E coli (STEC) NOT DETECTED NOT DETECTED Final   E. coli O157 NOT DETECTED NOT DETECTED Final   Shigella/Enteroinvasive E coli (EIEC) NOT DETECTED NOT  DETECTED Final   Cryptosporidium NOT DETECTED NOT DETECTED Final   Cyclospora cayetanensis NOT DETECTED NOT DETECTED Final   Entamoeba histolytica NOT DETECTED NOT DETECTED Final   Giardia lamblia NOT DETECTED NOT DETECTED Final   Adenovirus F40/41 NOT DETECTED NOT DETECTED Final   Astrovirus NOT DETECTED NOT DETECTED Final   Norovirus GI/GII NOT DETECTED NOT DETECTED Final   Rotavirus A NOT DETECTED NOT DETECTED Final   Sapovirus (I, II, IV, and V) NOT DETECTED NOT DETECTED Final  C difficile quick scan w PCR reflex     Status: Abnormal   Collection Time: 04/17/16 12:59 PM  Result Value Ref Range Status   C Diff antigen POSITIVE (A) NEGATIVE Final   C Diff toxin POSITIVE (A) NEGATIVE Final   C Diff interpretation   Final    Positive for toxigenic C. difficile, active toxin production present.    Comment: CRITICAL RESULT CALLED TO, READ BACK BY AND VERIFIED WITH: [facility] WEAVER 04/17/16 AT 1448 VKB     Medical History: Past Medical History  Diagnosis Date  . Glaucoma   . GERD (gastroesophageal reflux disease)   . Hypercholesteremia   . PONV (postoperative nausea and vomiting)   . Hypertension   . Anxiety   . Cancer ([place])   . Ovarian cancer ([place])     Medications:  Scheduled:  . cefTRIAXone (ROCEPHIN)  IV  1 g Intravenous Q24H  . enoxaparin (LOVENOX) injection  40 mg Subcutaneous Q24H  . latanoprost  1 drop Both Eyes QHS  . LORazepam  0.5 mg Oral BID  . pantoprazole  40 mg Oral Daily  . simvastatin  20 mg Oral QHS  . vancomycin  250 mg Oral Q6H  . vitamin C  500 mg Oral Daily    Assessment: Pt is a 82 year old female who presents with n/v/d x 3 days. Pt is cdiff positive. K =3.1, mg=1.6, phos =2.7  Electrolytes this morning are WNL with K = 3.6 & Mg = 2  Goal of Therapy:  K=3.5-5 Mg=1.7-2.4  Plan:  No supplementation needed at this time. Will recheck electrolytes with AM labs tomorrow.  Theodis Shove, Pharm.D Clinical Pharmacist 04/18/2016,7:56 AM

## 2016-04-19 ENCOUNTER — Inpatient Hospital Stay: Payer: Medicare Other

## 2016-04-19 ENCOUNTER — Inpatient Hospital Stay: Payer: Medicare Other | Admitting: Oncology

## 2016-04-19 LAB — BASIC METABOLIC PANEL
Anion gap: 5 (ref 5–15)
BUN: 9 mg/dL (ref 6–20)
CHLORIDE: 101 mmol/L (ref 101–111)
CO2: 22 mmol/L (ref 22–32)
CREATININE: 0.56 mg/dL (ref 0.44–1.00)
Calcium: 7.1 mg/dL — ABNORMAL LOW (ref 8.9–10.3)
GFR calc non Af Amer: >60 mL/min (ref >60)
Glucose, Bld: 99 mg/dL (ref 65–99)
POTASSIUM: 3.3 mmol/L — AB (ref 3.5–5.1)
Sodium: 128 mmol/L — ABNORMAL LOW (ref 135–145)

## 2016-04-19 LAB — MAGNESIUM: Magnesium: 1.5 mg/dL — ABNORMAL LOW (ref 1.7–2.4)

## 2016-04-19 MED ORDER — MAGNESIUM SULFATE 2 GM/50ML IV SOLN
2.0000 g | Freq: Once | INTRAVENOUS | Status: AC
  Administered 2016-04-19: 09:00:00 2 g via INTRAVENOUS
  Filled 2016-04-19: qty 50

## 2016-04-19 MED ORDER — POTASSIUM CHLORIDE CRYS ER 20 MEQ PO TBCR
40.0000 meq | EXTENDED_RELEASE_TABLET | Freq: Once | ORAL | Status: AC
  Administered 2016-04-19: 09:00:00 40 meq via ORAL
  Filled 2016-04-19: qty 2

## 2016-04-19 NOTE — Evaluation (Signed)
Physical Therapy Evaluation Patient Details Name: Kathryn Chandler MRN: [id number] DOB: [birth year] Today's Date: 04/19/2016   History of Present Illness  82 yo F presented to ED on 5/30 for severe diarrhea, nausea/vomitting, weakness and poor PO intake, found to have renal failure. Pt is currently being treated for ovarian cancer. PMH includes HTN, cancer, exploratory laparotomy with bowel resection.   Clinical Impression  Pt demonstrated generalized weakness and difficulty walking due to multiple medical complications. She has very low endurance and requires increased time to complete tasks. Pt required min A for bed mobility as well as transfers and limited ambulation of 60ft with SPC. She becomes SOB with minimal activity. Pt continues to be incontinent of B/B during session. STR recommended after hospital discharge to address deficits of strength, endurance, balance and mobility prior to returning home alone. She is currently being treated for cancer. Pt will benefit from skilled PT services to increase functional I and mobility for safe discharge.     Follow Up Recommendations SNF    Equipment Recommendations  None recommended by PT    Recommendations for Other Services       Precautions / Restrictions Precautions Precautions: Fall;Other (comment) (cancer) Restrictions Weight Bearing Restrictions: No      Mobility  Bed Mobility Overal bed mobility: Needs Assistance Bed Mobility: Supine to Sit     Supine to sit: Min assist     General bed mobility comments: uses rail, assistance to get legs of EOB and to get trunk upright  Transfers Overall transfer level: Needs assistance Equipment used: Straight cane Transfers: Sit to/from [employer] Transfers Sit to Stand: Min assist Stand pivot transfers: Min assist       General transfer comment: pt slightly unsteady, no LOB  Ambulation/Gait Ambulation/Gait assistance: Min assist Ambulation Distance (Feet): 5  Feet Assistive device: Straight cane Gait Pattern/deviations: Step-to pattern;Trunk flexed;Narrow base of support Gait velocity: reduced   General Gait Details: Very slow, cautious gait.   Stairs            Wheelchair Mobility    Modified Rankin (Stroke Patients Only)       Balance Overall balance assessment: Needs assistance;History of Falls Sitting-balance support: Bilateral upper extremity supported;Feet supported Sitting balance-Leahy Scale: Fair     Standing balance support: Single extremity supported Standing balance-Leahy Scale: Fair Standing balance comment: weak, slightly unsteady                             Pertinent Vitals/Pain Pain Assessment: No/denies pain    Home Living Family/patient expects to be discharged to:: Private residence Living Arrangements: Alone Available Help at Discharge: Family Type of Home: House Home Access: Stairs to enter     Home Layout: Two level;Able to live on main level with bedroom/bathroom Home Equipment: Gilford Rile - 2 wheels;Cane - single point;Bedside commode      Prior Function Level of Independence: Independent with assistive device(s)         Comments: I with ADLs, driving, ambulates with SPC     Hand Dominance        Extremity/Trunk Assessment   Upper Extremity Assessment: Generalized weakness           Lower Extremity Assessment: Generalized weakness         Communication   Communication: No difficulties  Cognition Arousal/Alertness: Awake/alert Behavior During Therapy: Flat affect Overall Cognitive Status: Within Functional Limits for tasks assessed  General Comments General comments (skin integrity, edema, etc.): soiled brief and pads, fatigues very quickly    Exercises Other Exercises Other Exercises: Pt ambulated ~5 ft with SPC and min A. Cues for safe technique. SOB with minimal exertion. Requires seated rest break for energy conservation.       Assessment/Plan    PT Assessment Patient needs continued PT services  PT Diagnosis Difficulty walking;Generalized weakness   PT Problem List Decreased strength;Decreased activity tolerance;Decreased balance;Decreased mobility;Decreased safety awareness  PT Treatment Interventions Gait training;Stair training;Therapeutic activities;Therapeutic exercise;Balance training;Neuromuscular re-education;Patient/family education   PT Goals (Current goals can be found in the Care Plan section) Acute Rehab PT Goals Patient Stated Goal: to get stronger PT Goal Formulation: With patient Time For Goal Achievement: 05/03/16 Potential to Achieve Goals: Fair    Frequency Min 2X/week   Barriers to discharge Inaccessible home environment;Decreased caregiver support pt lives alone    Co-evaluation               End of Session Equipment Utilized During Treatment: Gait belt Activity Tolerance: Patient limited by fatigue Patient left: in chair;with call bell/phone within reach;with chair alarm set Nurse Communication: Mobility status         Time: 1005-1030 PT Time Calculation (min) (ACUTE ONLY): 25 min   Charges:   PT Evaluation $PT Eval Moderate Complexity: 1 Procedure PT Treatments $Gait Training: 8-22 mins   PT G Codes:        Neoma Laming, PT, DPT  04/19/2016, 11:15 AM [phone]

## 2016-04-19 NOTE — Consult Note (Signed)
MEDICATION RELATED CONSULT NOTE - FOLLOW UP   Pharmacy Consult for electrolytes Indication: hypokalemia/hypomagnesium  Allergies  Allergen Reactions  . Clindamycin/Lincomycin Diarrhea and Other (See Comments)    Reaction:  Antibiotic colitis   . Codeine Anxiety   Patient Measurements: Height: 4\' 11"  (149.9 cm) Weight: 113 lb 9.6 oz (51.529 kg) IBW/kg (Calculated) : 43.2  Vital Signs: Temp: 98.7 F (37.1 C) (06/01 0554) Temp Source: Oral (06/01 0554) BP: 119/60 mmHg (06/01 0554) Pulse Rate: 100 (06/01 0554) Intake/Output from previous day:   Intake/Output from this shift:    Labs:  Recent Labs  04/17/16 1158 04/18/16 0403 04/19/16 0621  WBC 10.9  --   --   HGB 10.1*  --   --   HCT 28.9*  --   --   PLT 211  --   --   CREATININE 0.74 0.61 0.56  MG 1.6* 2.0 1.5*  PHOS 2.7  --   --   ALBUMIN 2.4*  --   --   PROT 6.0*  --   --   AST 21  --   --   ALT 17  --   --   ALKPHOS 72  --   --   BILITOT 1.4*  --   --    Estimated Creatinine Clearance: 35.1 mL/min (by C-G formula based on Cr of 0.56).  Microbiology: Recent Results (from the past 720 hour(s))  Urine culture     Status: Abnormal (Preliminary result)   Collection Time: 04/17/16 12:59 PM  Result Value Ref Range Status   Specimen Description URINE, RANDOM  Final   Special Requests NONE  Final   Culture (A)  Final    >=100,000 COLONIES/mL GRAM NEGATIVE RODS >=100,000 COLONIES/mL GRAM POSITIVE COCCI    Report Status PENDING  Incomplete  Gastrointestinal Panel by PCR , Stool     Status: None   Collection Time: 04/17/16 12:59 PM  Result Value Ref Range Status   Campylobacter species NOT DETECTED NOT DETECTED Final   Plesimonas shigelloides NOT DETECTED NOT DETECTED Final   Salmonella species NOT DETECTED NOT DETECTED Final   Yersinia enterocolitica NOT DETECTED NOT DETECTED Final   Vibrio species NOT DETECTED NOT DETECTED Final   Vibrio cholerae NOT DETECTED NOT DETECTED Final   Enteroaggregative E coli  (EAEC) NOT DETECTED NOT DETECTED Final   Enteropathogenic E coli (EPEC) NOT DETECTED NOT DETECTED Final   Enterotoxigenic E coli (ETEC) NOT DETECTED NOT DETECTED Final   Shiga like toxin producing E coli (STEC) NOT DETECTED NOT DETECTED Final   E. coli O157 NOT DETECTED NOT DETECTED Final   Shigella/Enteroinvasive E coli (EIEC) NOT DETECTED NOT DETECTED Final   Cryptosporidium NOT DETECTED NOT DETECTED Final   Cyclospora cayetanensis NOT DETECTED NOT DETECTED Final   Entamoeba histolytica NOT DETECTED NOT DETECTED Final   Giardia lamblia NOT DETECTED NOT DETECTED Final   Adenovirus F40/41 NOT DETECTED NOT DETECTED Final   Astrovirus NOT DETECTED NOT DETECTED Final   Norovirus GI/GII NOT DETECTED NOT DETECTED Final   Rotavirus A NOT DETECTED NOT DETECTED Final   Sapovirus (I, II, IV, and V) NOT DETECTED NOT DETECTED Final  C difficile quick scan w PCR reflex     Status: Abnormal   Collection Time: 04/17/16 12:59 PM  Result Value Ref Range Status   C Diff antigen POSITIVE (A) NEGATIVE Final   C Diff toxin POSITIVE (A) NEGATIVE Final   C Diff interpretation   Final    Positive for toxigenic C.  difficile, active toxin production present.    Comment: CRITICAL RESULT CALLED TO, READ BACK BY AND VERIFIED WITH: SONJA WEAVER 04/17/16 AT 1448 VKB   Blood Culture (routine x 2)     Status: None (Preliminary result)   Collection Time: 04/17/16  3:10 PM  Result Value Ref Range Status   Specimen Description BLOOD RIGHT FATTY CASTS  Final   Special Requests BOTTLES DRAWN AEROBIC AND ANAEROBIC 6CC  Final   Culture NO GROWTH < 24 HOURS  Final   Report Status PENDING  Incomplete  Blood Culture (routine x 2)     Status: None (Preliminary result)   Collection Time: 04/17/16  3:26 PM  Result Value Ref Range Status   Specimen Description BLOOD LEFT WRIST  Final   Special Requests BOTTLES DRAWN AEROBIC AND ANAEROBIC 6CC  Final   Culture NO GROWTH < 24 HOURS  Final   Report Status PENDING  Incomplete     Medical History: Past Medical History  Diagnosis Date  . Glaucoma   . GERD (gastroesophageal reflux disease)   . Hypercholesteremia   . PONV (postoperative nausea and vomiting)   . Hypertension   . Anxiety   . Cancer ([place])   . Ovarian cancer ([facility])     Medications:  Scheduled:  . cefTRIAXone (ROCEPHIN)  IV  1 g Intravenous Q24H  . enoxaparin (LOVENOX) injection  40 mg Subcutaneous Q24H  . feeding supplement  1 Container Oral TID BM  . latanoprost  1 drop Both Eyes QHS  . LORazepam  0.5 mg Oral BID  . pantoprazole  40 mg Oral Daily  . potassium chloride  40 mEq Oral Once  . simvastatin  20 mg Oral QHS  . vancomycin  250 mg Oral Q6H  . vitamin C  500 mg Oral Daily    Assessment: Electrolytes this morning are WNL except K= 3.3 and magnesium: 1.5   Goal of Therapy:  K=3.5-5 Mg=1.7-2.4  Plan:  Will give K[place] Con 40 mEq PO x 1 and will also give Magnesium 2 g IV x 1.  Will recheck electrolytes with AM labs tomorrow.  Larene Beach, PharmD  Clinical Pharmacist 04/19/2016,8:15 AM

## 2016-04-20 LAB — BASIC METABOLIC PANEL
Anion gap: 9 (ref 5–15)
BUN: 5 mg/dL — AB (ref 6–20)
CHLORIDE: 99 mmol/L — AB (ref 101–111)
CO2: 22 mmol/L (ref 22–32)
Calcium: 7.2 mg/dL — ABNORMAL LOW (ref 8.9–10.3)
Creatinine, Ser: 0.33 mg/dL — ABNORMAL LOW (ref 0.44–1.00)
GFR calc non Af Amer: >60 mL/min (ref >60)
Glucose, Bld: 95 mg/dL (ref 65–99)
POTASSIUM: 3.7 mmol/L (ref 3.5–5.1)
SODIUM: 130 mmol/L — AB (ref 135–145)

## 2016-04-20 LAB — URINE CULTURE: Culture: >=100000 — AB

## 2016-04-20 LAB — MAGNESIUM: MAGNESIUM: 1.3 mg/dL — AB (ref 1.7–2.4)

## 2016-04-20 MED ORDER — LEVOFLOXACIN 500 MG PO TABS
500.0000 mg | ORAL_TABLET | Freq: Every day | ORAL | Status: DC
  Administered 2016-04-20: 09:00:00 500 mg via ORAL
  Filled 2016-04-20: qty 1

## 2016-04-20 MED ORDER — LEVOFLOXACIN 500 MG PO TABS: 500.0000 mg | ORAL_TABLET | Freq: Every day | ORAL | Status: DC

## 2016-04-20 MED ORDER — VANCOMYCIN 50 MG/ML ORAL SOLUTION: 250.0000 mg | Freq: Four times a day (QID) | ORAL | Status: DC

## 2016-04-20 MED ORDER — MAGNESIUM OXIDE 400 (241.3 MG) MG PO TABS
400.0000 mg | ORAL_TABLET | ORAL | Status: DC | PRN
Start: 2016-04-20 — End: 2016-04-20

## 2016-04-20 MED ORDER — BOOST / RESOURCE BREEZE PO LIQD: 1.0000 | Freq: Three times a day (TID) | ORAL | Status: DC

## 2016-04-20 MED ORDER — MAGNESIUM SULFATE 4 GM/100ML IV SOLN
4.0000 g | Freq: Once | INTRAVENOUS | Status: AC
  Administered 2016-04-20: 4 g via INTRAVENOUS
  Filled 2016-04-20: qty 100

## 2016-04-20 MED ORDER — MAGNESIUM OXIDE 400 (241.3 MG) MG PO TABS: 400.0000 mg | ORAL_TABLET | Freq: Every day | ORAL | Status: DC

## 2016-04-20 NOTE — Progress Notes (Signed)
Checked to see if pre-authorization would be needed for non-emergent EMS transport. Per [employer] benefits obtained online through [employer], patient has a [employer] Medicare Advantage PPO policy.  Medicare PPO plans do not require pre-auth for non-emergent ground transports using service codes A0426 or A0428.   

## 2016-04-20 NOTE — Consult Note (Signed)
MEDICATION RELATED CONSULT NOTE - FOLLOW UP   Pharmacy Consult for electrolytes Indication: hypokalemia/hypomagnesium  Allergies  Allergen Reactions  . Clindamycin/Lincomycin Diarrhea and Other (See Comments)    Reaction:  Antibiotic colitis   . Codeine Anxiety   Patient Measurements: Height: 4\' 11"  (149.9 cm) Weight: 113 lb 9.6 oz (51.529 kg) IBW/kg (Calculated) : 43.2  Vital Signs: Temp: 97.7 F (36.5 C) (06/02 1403) Temp Source: Oral (06/02 1403) BP: 107/56 mmHg (06/02 1403) Pulse Rate: 100 (06/02 1403) Intake/Output from previous day: 06/01 0701 - 06/02 0700 In: 1635.7 [I.V.:1635.7] Out: -  Intake/Output from this shift: Total I/O In: 455.8 [I.V.:455.8] Out: -   Labs:  Recent Labs  04/18/16 0403 04/19/16 0621 04/20/16 0522  CREATININE 0.61 0.56 0.33*  MG 2.0 1.5* 1.3*   Estimated Creatinine Clearance: 35.1 mL/min (by C-G formula based on Cr of 0.33).  Assessment: Electrolytes this morning are WNL except  magnesium: 1.3  Goal of Therapy:  K=3.5-5 Mg=1.7-2.4  Plan:  Ordered magnesium 4gm IV x 1. Recheck with AM labs  Rexene Edison, PharmD Clinical Pharmacist   04/20/2016,2:49 PM

## 2016-04-20 NOTE — Care Management Important Message (Signed)
Important Message  Patient Details  Name: Kathryn Chandler MRN: [id number] Date of Birth: 27-Apr-1929   Medicare Important Message Given:  Yes    Shelbie Ammons, RN 04/20/2016, 9:35 AM

## 2016-04-20 NOTE — Discharge Instructions (Signed)
STR rehab

## 2016-04-20 NOTE — Progress Notes (Signed)
Patient discharged via EMS. Goldia Ligman S, RN  

## 2016-04-20 NOTE — Clinical Social Work Placement (Signed)
   CLINICAL SOCIAL WORK PLACEMENT  NOTE  Date:  04/20/2016  Patient Details  Name: Kathryn Chandler MRN: [id number] Date of Birth: 06-11-1929  Clinical Social Work is seeking post-discharge placement for this patient at the [place] level of care (*CSW will initial, date and re-position this form in  chart as items are completed):  Yes   Patient/family provided with [facility] Work Department's list of facilities offering this level of care within the geographic area requested by the patient (or if unable, by the patient's family).  Yes   Patient/family informed of their freedom to choose among providers that offer the needed level of care, that participate in Medicare, Medicaid or managed care program needed by the patient, have an available bed and are willing to accept the patient.  Yes   Patient/family informed of [facility]'s ownership interest in [facility] and [facility], as well as of the fact that they are under no obligation to receive care at these facilities.  PASRR submitted to EDS on       PASRR number received on       Existing PASRR number confirmed on 04/20/16     FL2 transmitted to all facilities in geographic area requested by pt/family on 04/20/16     FL2 transmitted to all facilities within larger geographic area on       Patient informed that his/her managed care company has contracts with or will negotiate with certain facilities, including the following:        Yes   Patient/family informed of bed offers received.  Patient chooses bed at [facility]     Physician recommends and patient chooses bed at  [facility])    Patient to be transferred to [facility] on 04/20/16.  Patient to be transferred to facility by [facility]     Patient family notified on 04/20/16 of transfer.  Name of family member notified:  Pt, Pt stated that she will follow up with her son.      PHYSICIAN        Additional Comment:    _______________________________________________ Darden Dates, LCSW 04/20/2016, 2:19 PM

## 2016-04-20 NOTE — NC FL2 (Signed)
[place] LEVEL OF CARE SCREENING TOOL     IDENTIFICATION  Patient Name: Kathryn Chandler Birthdate: [birth year] Sex: female Admission Date (Current Location): 04/17/2016  [place] and [place] Number:  Engineering geologist and Address:  [facility], [address], [place], [place] [address]      Provider Number: [id number]  Attending Physician Name and Address:  Fritzi Mandes, MD  Relative Name and Phone Number:       Current Level of Care: Hospital Recommended Level of Care: [place] Prior Approval Number:    Date Approved/Denied:   PASRR Number: [id number]  Discharge Plan: SNF    Current Diagnoses: Patient Active Problem List   Diagnosis Date Noted  . Renal failure 04/17/2016  . Severe protein-calorie malnutrition Altamease Oiler: less than 60% of standard weight) ([place]) 11/07/2015  . Essential (primary) hypertension 11/05/2015  . Malignant neoplasm of ovary ([place]) 11/05/2015  . Colovaginal fistula 11/04/2015  . Acid reflux 08/03/2015  . Bilateral cataracts 08/03/2015  . Chicken pox 08/03/2015  . Allergic state 08/03/2015  . Breast lump 08/03/2015  . Hypercholesteremia 08/03/2015  . Malignant melanoma ([place]) 08/03/2015  . Ovarian cancer ([place]) 07/20/2015  . Head or neck swelling, mass, or lump 08/17/2014    Orientation RESPIRATION BLADDER Height & Weight     Self, Time, Situation, Place  Normal Incontinent Weight: 113 lb 9.6 oz (51.529 kg) Height:  4\' 11"  (149.9 cm)  BEHAVIORAL SYMPTOMS/MOOD NEUROLOGICAL BOWEL NUTRITION STATUS      Incontinent Diet (Soft Diet)  AMBULATORY STATUS COMMUNICATION OF NEEDS Skin   Limited Assist Verbally Normal                       Personal Care Assistance Level of Assistance  Bathing, Feeding, Dressing Bathing Assistance: Limited assistance Feeding assistance: Limited assistance Dressing Assistance: Limited assistance     Functional Limitations Info  Sight, Hearing, Speech  Sight Info: Adequate Hearing Info: Adequate Speech Info: Adequate    SPECIAL CARE FACTORS FREQUENCY  PT (By licensed PT)     PT Frequency: 5x              Contractures      Additional Factors Info  Code Status, Allergies Code Status Info: Full Code Allergies Info: Allergies: Clindamycin/lincomycin, Codeine           Current Medications (04/20/2016):  This is the current hospital active medication list Current Facility-Administered Medications  Medication Dose Route Frequency Provider Last Rate Last Dose  . 0.9 % NaCl with KCl 20 mEq/ L  infusion   Intravenous Continuous Fritzi Mandes, MD 50 mL/hr at 04/20/16 0917    . acetaminophen (TYLENOL) tablet 650 mg  650 mg Oral Q6H PRN Fritzi Mandes, MD       Or  . acetaminophen (TYLENOL) suppository 650 mg  650 mg Rectal Q6H PRN Fritzi Mandes, MD      . enoxaparin (LOVENOX) injection 40 mg  40 mg Subcutaneous Q24H Fritzi Mandes, MD   40 mg at 04/19/16 2159  . feeding supplement (BOOST / RESOURCE BREEZE) liquid 1 Container  1 Container Oral TID BM Fritzi Mandes, MD   1 Container at 04/20/16 [phone]  . fluticasone (FLONASE) 50 MCG/ACT nasal spray 2 spray  2 spray Each Nare Daily PRN Fritzi Mandes, MD      . HYDROcodone-acetaminophen (NORCO/VICODIN) 5-325 MG per tablet 1-2 tablet  1-2 tablet Oral Q4H PRN Fritzi Mandes, MD      . latanoprost (  XALATAN) 0.005 % ophthalmic solution 1 drop  1 drop Both Eyes QHS Fritzi Mandes, MD   1 drop at 04/19/16 2159  . levofloxacin (LEVAQUIN) tablet 500 mg  500 mg Oral Daily Fritzi Mandes, MD   500 mg at 04/20/16 0921  . LORazepam (ATIVAN) tablet 0.5 mg  0.5 mg Oral BID Fritzi Mandes, MD   0.5 mg at 04/20/16 0920  . magnesium oxide (MAG-OX) tablet 400 mg  400 mg Oral Q4H PRN Harrie Foreman, MD      . magnesium sulfate IVPB 4 g 100 mL  4 g Intravenous Once Fritzi Mandes, MD   4 g at 04/20/16 [id number]  . morphine 2 MG/ML injection 1 mg  1 mg Intravenous Q4H PRN Fritzi Mandes, MD      . ondansetron (ZOFRAN) tablet 4 mg  4 mg Oral Q6H PRN Fritzi Mandes, MD       Or  . ondansetron (ZOFRAN) injection 4 mg  4 mg Intravenous Q6H PRN Fritzi Mandes, MD      . pantoprazole (PROTONIX) EC tablet 40 mg  40 mg Oral Daily Fritzi Mandes, MD   40 mg at 04/20/16 0921  . simvastatin (ZOCOR) tablet 20 mg  20 mg Oral QHS Fritzi Mandes, MD   20 mg at 04/19/16 2158  . vancomycin (VANCOCIN) 50 mg/mL oral solution 250 mg  250 mg Oral Q6H Fritzi Mandes, MD   250 mg at 04/20/16 0921  . vitamin C (ASCORBIC ACID) tablet 500 mg  500 mg Oral Daily Fritzi Mandes, MD   500 mg at 04/20/16 [id number]   Facility-Administered Medications Ordered in Other Encounters  Medication Dose Route Frequency Provider Last Rate Last Dose  . diphenhydrAMINE (BENADRYL) injection 12.5 mg  12.5 mg Intravenous Once Forest Gleason, MD      . methylPREDNISolone sodium succinate (SOLU-MEDROL) 125 mg/2 mL injection 100 mg  100 mg Intravenous Once Forest Gleason, MD         Discharge Medications: Please see discharge summary for a list of discharge medications.  Relevant Imaging Results:  Relevant Lab Results:   Additional Information SSN:  [id number]  Darden Dates, LCSW

## 2016-04-20 NOTE — Discharge Summary (Signed)
[facility] NAME: Kathryn Chandler    MR#:  [id number]  DATE OF BIRTH:  1928/12/30  DATE OF ADMISSION:  04/17/2016 ADMITTING PHYSICIAN: Fritzi Mandes, MD  DATE OF DISCHARGE: 04/20/16  PRIMARY CARE PHYSICIAN: Albina Billet, MD    ADMISSION DIAGNOSIS:  Dehydration [E86.0] Hyponatremia [E87.1] Generalized abdominal pain [R10.84] C. difficile colitis [A04.7] Cystitis [N30.90] Sepsis, due to unspecified organism ([place]) [A41.9]  DISCHARGE DIAGNOSIS:  Sepsis due to UTI(enterococcus and Klebsiella) and C difficile coilits C diff colitis Hyponatremia/hypokalemia/hypomagnesimia due to GI losses Acute renal failure due to dehydration H/o Ovarian cancer undergoing chemotherapy  SECONDARY DIAGNOSIS:   Past Medical History  Diagnosis Date  . Glaucoma   . GERD (gastroesophageal reflux disease)   . Hypercholesteremia   . PONV (postoperative nausea and vomiting)   . Hypertension   . Anxiety   . Cancer ([place])   . Ovarian cancer [facility])     HOSPITAL COURSE:   Kathryn Chandler is a 82 y.o. female with a known history of Ovarian cancer status post chemotherapy last dose was on May 26, hypertension, anxiety comes to the emergency room with increasing nausea vomiting and diarrheal stools last 3 days. She feels weak had a couple falls at home which were nontraumatic. Patient also started having abdominal cramping.  1.Sepsis due to C difficile diarrhea/ UTI -Patient presented with fever of 100.9 tachycardic heart rate in the 120s positive C. difficile positive your urine for UTI. -IV Rocephin---change to po levaquin (according to sensitivities-spoke with micro tech) -Oral vancomycin 250 mg every 6 hourly. We'll consider vancomycin given patient's immunocompromised state - blood cultures negative -afebrile -Urine culture grows 100,000 colony of gram-negative rods and 100,000 colonies of enterococcus   2. Acute renal failure with clinical  dehydration hypokalemia hyponatremia due to GI losses -Repleted with IV fluids -Replete metabolic panel -Pharmacy consult for electrolyte abnormality  3. Ovarian cancer status post chemotherapy last chemotherapy was May 26  4. Hyperlipidemia on simvastatin  5. DVT prophylaxis subcutaneous Lovenox  6. Gen weakness and deconditioning PT recommends rehab. Pt has been at [facility] before and would like to go there. Improving slowly. Ok to d/c to rehab CONSULTS OBTAINED:     DRUG ALLERGIES:   Allergies  Allergen Reactions  . Clindamycin/Lincomycin Diarrhea and Other (See Comments)    Reaction:  Antibiotic colitis   . Codeine Anxiety    DISCHARGE MEDICATIONS:   Current Discharge Medication List    START taking these medications   Details  feeding supplement (BOOST / RESOURCE BREEZE) LIQD Take 1 Container by mouth 3 (three) times daily between meals. Qty: 60 Container, Refills: 0    levofloxacin (LEVAQUIN) 500 MG tablet Take 1 tablet (500 mg total) by mouth daily. Qty: 4 tablet, Refills: 0    magnesium oxide (MAG-OX) 400 (241.3 Mg) MG tablet Take 1 tablet (400 mg total) by mouth daily. Qty: 30 tablet, Refills: 0    vancomycin (VANCOCIN) 50 mg/mL oral solution Take 5 mLs (250 mg total) by mouth every 6 (six) hours. Qty: 28 mL, Refills: 0      CONTINUE these medications which have NOT CHANGED   Details  acetaminophen (TYLENOL) 500 MG tablet Take 500-1,000 mg by mouth every 6 (six) hours as needed for mild pain, fever or headache.     bimatoprost (LUMIGAN) 0.01 % SOLN Place 1 drop into both eyes at bedtime.    fluticasone (FLONASE) 50 MCG/ACT nasal spray Place 2 sprays into both  nostrils daily as needed for rhinitis.     lidocaine-prilocaine (EMLA) cream Apply 1 application topically as needed (prior to accessing port).    LORazepam (ATIVAN) 0.5 MG tablet Take 0.5 mg by mouth 2 (two) times daily.     meloxicam (MOBIC) 15 MG tablet Take 15 mg by mouth daily.      omeprazole (PRILOSEC) 20 MG capsule Take 20 mg by mouth daily.     ondansetron (ZOFRAN) 4 MG tablet Take 1 tablet (4 mg total) by mouth every 6 (six) hours as needed for nausea. Qty: 30 tablet, Refills: 3   Associated Diagnoses: Ovarian cancer, unspecified laterality (HCC)    simvastatin (ZOCOR) 20 MG tablet Take 20 mg by mouth at bedtime.     vitamin C (ASCORBIC ACID) 500 MG tablet Take 500 mg by mouth daily.        If you experience worsening of your admission symptoms, develop shortness of breath, life threatening emergency, suicidal or homicidal thoughts you must seek medical attention immediately by calling 911 or calling your MD immediately  if symptoms less severe.  You Must read complete instructions/literature along with all the possible adverse reactions/side effects for all the Medicines you take and that have been prescribed to you. Take any new Medicines after you have completely understood and accept all the possible adverse reactions/side effects.   Please note  You were cared for by a hospitalist during your hospital stay. If you have any questions about your discharge medications or the care you received while you were in the hospital after you are discharged, you can call the unit and asked to speak with the hospitalist on call if the hospitalist that took care of you is not available. Once you are discharged, your primary care physician will handle any further medical issues. Please note that NO REFILLS for any discharge medications will be authorized once you are discharged, as it is imperative that you return to your primary care physician (or establish a relationship with a primary care physician if you do not have one) for your aftercare needs so that they can reassess your need for medications and monitor your lab values. Today   SUBJECTIVE   feels weak. Improving slowly   VITAL SIGNS:  Blood pressure 114/79, pulse 101, temperature 98.7 F (37.1 C), temperature  source Oral, resp. rate 18, height 4\' 11"  (1.499 m), weight 51.529 kg (113 lb 9.6 oz), SpO2 96 %.  I/O:   Intake/Output Summary (Last 24 hours) at 04/20/16 0839 Last data filed at 04/20/16 0705  Gross per 24 hour  Intake 1635.67 ml  Output      0 ml  Net 1635.67 ml    PHYSICAL EXAMINATION:  GENERAL:  82 y.o.-year-old patient lying in the bed with no acute distress. Thin frail EYES: Pupils equal, round, reactive to light and accommodation. No scleral icterus. Extraocular muscles intact.  HEENT: Head atraumatic, normocephalic. Oropharynx and nasopharynx clear.  NECK:  Supple, no jugular venous distention. No thyroid enlargement, no tenderness.  LUNGS: Normal breath sounds bilaterally, no wheezing, rales,rhonchi or crepitation. No use of accessory muscles of respiration.  CARDIOVASCULAR: S1, S2 normal. No murmurs, rubs, or gallops.  ABDOMEN: Soft, non-tender, non-distended. Bowel sounds present. No organomegaly or mass.  EXTREMITIES: No pedal edema, cyanosis, or clubbing.  NEUROLOGIC: Cranial nerves II through XII are intact. Muscle strength 5/5 in all extremities. Sensation intact. Gait not checked.  PSYCHIATRIC: The patient is alert and oriented x 3.  SKIN: No obvious rash, lesion,  or ulcer.   DATA REVIEW:   CBC   Recent Labs Lab 04/17/16 1158  WBC 10.9  HGB 10.1*  HCT 28.9*  PLT 211    Chemistries   Recent Labs Lab 04/17/16 1158  04/20/16 0522  NA 127*  < > 130*  K 3.1*  < > 3.7  CL 89*  < > 99*  CO2 26  < > 22  GLUCOSE 115*  < > 95  BUN 20  < > 5*  CREATININE 0.74  < > 0.33*  CALCIUM 7.7*  < > 7.2*  MG 1.6*  < > 1.3*  AST 21  --   --   ALT 17  --   --   ALKPHOS 72  --   --   BILITOT 1.4*  --   --   < > = values in this interval not displayed.  Microbiology Results   Recent Results (from the past 240 hour(s))  Urine culture     Status: Abnormal (Preliminary result)   Collection Time: 04/17/16 12:59 PM  Result Value Ref Range Status   Specimen  Description URINE, RANDOM  Final   Special Requests NONE  Final   Culture (A)  Final    >=100,000 COLONIES/mL KLEBSIELLA PNEUMONIAE >=100,000 COLONIES/mL ENTEROCOCCUS SPECIES SUSCEPTIBILITIES TO FOLLOW Performed at [facility]    Report Status PENDING  Incomplete  Gastrointestinal Panel by PCR , Stool     Status: None   Collection Time: 04/17/16 12:59 PM  Result Value Ref Range Status   Campylobacter species NOT DETECTED NOT DETECTED Final   Plesimonas shigelloides NOT DETECTED NOT DETECTED Final   Salmonella species NOT DETECTED NOT DETECTED Final   Yersinia enterocolitica NOT DETECTED NOT DETECTED Final   Vibrio species NOT DETECTED NOT DETECTED Final   Vibrio cholerae NOT DETECTED NOT DETECTED Final   Enteroaggregative E coli (EAEC) NOT DETECTED NOT DETECTED Final   Enteropathogenic E coli (EPEC) NOT DETECTED NOT DETECTED Final   Enterotoxigenic E coli (ETEC) NOT DETECTED NOT DETECTED Final   Shiga like toxin producing E coli (STEC) NOT DETECTED NOT DETECTED Final   E. coli O157 NOT DETECTED NOT DETECTED Final   Shigella/Enteroinvasive E coli (EIEC) NOT DETECTED NOT DETECTED Final   Cryptosporidium NOT DETECTED NOT DETECTED Final   Cyclospora cayetanensis NOT DETECTED NOT DETECTED Final   Entamoeba histolytica NOT DETECTED NOT DETECTED Final   Giardia lamblia NOT DETECTED NOT DETECTED Final   Adenovirus F40/41 NOT DETECTED NOT DETECTED Final   Astrovirus NOT DETECTED NOT DETECTED Final   Norovirus GI/GII NOT DETECTED NOT DETECTED Final   Rotavirus A NOT DETECTED NOT DETECTED Final   Sapovirus (I, II, IV, and V) NOT DETECTED NOT DETECTED Final  C difficile quick scan w PCR reflex     Status: Abnormal   Collection Time: 04/17/16 12:59 PM  Result Value Ref Range Status   C Diff antigen POSITIVE (A) NEGATIVE Final   C Diff toxin POSITIVE (A) NEGATIVE Final   C Diff interpretation   Final    Positive for toxigenic C. difficile, active toxin production present.     Comment: CRITICAL RESULT CALLED TO, READ BACK BY AND VERIFIED WITH: [facility] WEAVER 04/17/16 AT 1448 VKB   Blood Culture (routine x 2)     Status: None (Preliminary result)   Collection Time: 04/17/16  3:10 PM  Result Value Ref Range Status   Specimen Description BLOOD RIGHT FATTY CASTS  Final   Special Requests BOTTLES DRAWN AEROBIC AND  ANAEROBIC 6CC  Final   Culture NO GROWTH < 24 HOURS  Final   Report Status PENDING  Incomplete  Blood Culture (routine x 2)     Status: None (Preliminary result)   Collection Time: 04/17/16  3:26 PM  Result Value Ref Range Status   Specimen Description BLOOD LEFT WRIST  Final   Special Requests BOTTLES DRAWN AEROBIC AND ANAEROBIC 6CC  Final   Culture NO GROWTH < 24 HOURS  Final   Report Status PENDING  Incomplete    RADIOLOGY:  No results found.   Management plans discussed with the patient, family and they are in agreement.  CODE STATUS:     Code Status Orders        Start     Ordered   04/17/16 1807  Full code   Continuous     04/17/16 1806    Code Status History    Date Active Date Inactive Code Status Order ID Comments User Context   11/04/2015  6:05 PM 11/05/2015  7:27 PM Full Code [id number]  Clayburn Pert, MD Inpatient   07/20/2015 12:09 PM 07/24/2015  6:47 PM Full Code [id number]  [place] Gaetana Michaelis, MD Inpatient    Advance Directive Documentation        Most Recent Value   Type of Advance Directive  Healthcare Power of [place], Living will   Pre-existing out of facility DNR order (yellow form or pink MOST form)     "MOST" Form in Place?        TOTAL TIME TAKING CARE OF THIS PATIENT: 22minutes.    Kathryn Chandler M.D on 04/20/2016 at 8:39 AM  Between 7am to 6pm - Pager - [phone] After 6pm go to [email or web address] - password [email or web address] [facility]  Office  [phone]  CC: Primary care physician; Albina Billet, MD

## 2016-04-20 NOTE — Clinical Social Work Note (Signed)
Clinical Social Work Assessment  Patient Details  Name: Kathryn Chandler MRN: [id number] Date of Birth: 1929/09/29  Date of referral:  04/20/16               Reason for consult:  Facility Placement                Permission sought to share information with:  Family Supports Permission granted to share information::  Yes, Verbal Permission Granted  Name::     son, [name]   Housing/Transportation Living arrangements for the past 2 months:  [place] of Information:  Patient Patient Interpreter Needed:  None Criminal Activity/Legal Involvement Pertinent to Current Situation/Hospitalization:  No - Comment as needed Significant Relationships:  Adult Children Lives with:  Self Do you feel safe going back to the place where you live?  Yes Need for family participation in patient care:  No (Coment)  Care giving concerns:  Noc are giving concerns identified.   Social Worker assessment / plan:  CSW met with pt to address consult for New SNF. PT is recommending SNF. CSW introduced herself and explained role of social work. CSW also explained process of discharging to SNF for STR. Pt lives alone and has a son. Pt gave permission for CSW to speak with son, however was unable to reach him. 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 Pt shared that she will follow up with him. Pt is interested in going to [facility], as she has been there in the past. CSW intiatied SNF search and follow up with [facility]. [facility] was able to make a bed offer, which pt accepted. Pt is ready for discharge today. Facility is able to accept today as they has received discharge information. RN called report and EMS will provide transportation. CSW is signing off as no further needs identified.   Employment status:  Retired Nurse, adult PT Recommendations:  [place] / Referral to community resources:  [place]  Patient/Family's Response to care:  Pt was appreciative of CSW support.  Patient/Family's Understanding of and Emotional Response to Diagnosis, Current Treatment, and Prognosis:  Pt understands that she would benefit from STR prior to returning home.   Emotional Assessment Appearance:  Appears stated age Attitude/Demeanor/Rapport:  Other (Appropriate) Affect (typically observed):  Accepting, Adaptable, Pleasant Orientation:  Oriented to Self, Oriented to Place, Oriented to  Time, Oriented to Situation Alcohol / Substance use:  Never Used Psych involvement (Current and /or in the community):  No (Comment)  Discharge Needs  Concerns to be addressed:  Adjustment to Illness Readmission within the last 30 days:  No Current discharge risk:  None Barriers to Discharge:  No Barriers Identified   Darden Dates, LCSW 04/20/2016, 2:23 PM

## 2016-04-20 NOTE — Progress Notes (Signed)
EMS called for transport. Leshawn Houseworth S, RN  

## 2016-04-20 NOTE — Progress Notes (Signed)
Report called to Kim at [facility]. Dianelys Scinto S, RN  

## 2016-04-22 LAB — CULTURE, BLOOD (ROUTINE X 2)
CULTURE: NO GROWTH
Culture: NO GROWTH

## 2016-04-24 ENCOUNTER — Telehealth: Payer: Self-pay | Admitting: *Deleted

## 2016-04-24 NOTE — Telephone Encounter (Signed)
-----   Message from [place] sent at 04/24/2016  9:45 AM EDT ----- Regarding: Cancel Appt FYI- This is the second appt canceled by pt. She called and stated she is in a rest home and just not feeling like coming. She will call to reschedule her appt at a later date. BF

## 2016-04-25 ENCOUNTER — Inpatient Hospital Stay: Payer: Medicare Other | Admitting: Oncology

## 2016-05-10 ENCOUNTER — Other Ambulatory Visit: Payer: Self-pay | Admitting: Nurse Practitioner

## 2016-05-30 ENCOUNTER — Inpatient Hospital Stay: Payer: Medicare Other

## 2016-05-30 ENCOUNTER — Inpatient Hospital Stay: Payer: Medicare Other | Attending: Oncology | Admitting: Oncology

## 2016-05-30 VITALS — BP 126/81 | HR 89 | Temp 97.5°F | Resp 18 | Wt 113.0 lb

## 2016-05-30 DIAGNOSIS — R5383 Other fatigue: Secondary | ICD-10-CM | POA: Diagnosis not present

## 2016-05-30 DIAGNOSIS — Z800 Family history of malignant neoplasm of digestive organs: Secondary | ICD-10-CM | POA: Insufficient documentation

## 2016-05-30 DIAGNOSIS — K219 Gastro-esophageal reflux disease without esophagitis: Secondary | ICD-10-CM | POA: Insufficient documentation

## 2016-05-30 DIAGNOSIS — I10 Essential (primary) hypertension: Secondary | ICD-10-CM | POA: Insufficient documentation

## 2016-05-30 DIAGNOSIS — R531 Weakness: Secondary | ICD-10-CM | POA: Diagnosis not present

## 2016-05-30 DIAGNOSIS — Z9221 Personal history of antineoplastic chemotherapy: Secondary | ICD-10-CM

## 2016-05-30 DIAGNOSIS — C562 Malignant neoplasm of left ovary: Secondary | ICD-10-CM | POA: Insufficient documentation

## 2016-05-30 DIAGNOSIS — Z79899 Other long term (current) drug therapy: Secondary | ICD-10-CM | POA: Insufficient documentation

## 2016-05-30 DIAGNOSIS — F419 Anxiety disorder, unspecified: Secondary | ICD-10-CM | POA: Diagnosis not present

## 2016-05-30 DIAGNOSIS — E7800 Pure hypercholesterolemia, unspecified: Secondary | ICD-10-CM

## 2016-05-30 DIAGNOSIS — C569 Malignant neoplasm of unspecified ovary: Secondary | ICD-10-CM

## 2016-05-30 LAB — CBC WITH DIFFERENTIAL/PLATELET
BASOS ABS: 0 10*3/uL (ref 0–0.1)
BASOS PCT: 1 %
EOS ABS: 0.1 10*3/uL (ref 0–0.7)
Eosinophils Relative: 2 %
HEMATOCRIT: 34 % — AB (ref 35.0–47.0)
HEMOGLOBIN: 11.5 g/dL — AB (ref 12.0–16.0)
Lymphocytes Relative: 42 %
Lymphs Abs: 3.1 10*3/uL (ref 1.0–3.6)
MCH: 33.4 pg (ref 26.0–34.0)
MCHC: 33.9 g/dL (ref 32.0–36.0)
MCV: 98.4 fL (ref 80.0–100.0)
MONO ABS: 0.8 10*3/uL (ref 0.2–0.9)
Monocytes Relative: 11 %
NEUTROS ABS: 3.4 10*3/uL (ref 1.4–6.5)
NEUTROS PCT: 46 %
Platelets: 258 10*3/uL (ref 150–440)
RBC: 3.45 MIL/uL — ABNORMAL LOW (ref 3.80–5.20)
RDW: 17.6 % — AB (ref 11.5–14.5)
WBC: 7.6 10*3/uL (ref 3.6–11.0)

## 2016-05-30 LAB — COMPREHENSIVE METABOLIC PANEL
ALBUMIN: 4.2 g/dL (ref 3.5–5.0)
ALK PHOS: 71 U/L (ref 38–126)
ALT: 22 U/L (ref 14–54)
ANION GAP: 7 (ref 5–15)
AST: 32 U/L (ref 15–41)
BILIRUBIN TOTAL: 1.1 mg/dL (ref 0.3–1.2)
BUN: 12 mg/dL (ref 6–20)
CALCIUM: 9.1 mg/dL (ref 8.9–10.3)
CO2: 29 mmol/L (ref 22–32)
Chloride: 100 mmol/L — ABNORMAL LOW (ref 101–111)
Creatinine, Ser: 0.68 mg/dL (ref 0.44–1.00)
GFR calc non Af Amer: >60 mL/min (ref >60)
Glucose, Bld: 100 mg/dL — ABNORMAL HIGH (ref 65–99)
Potassium: 5 mmol/L (ref 3.5–5.1)
SODIUM: 136 mmol/L (ref 135–145)
TOTAL PROTEIN: 7.7 g/dL (ref 6.5–8.1)

## 2016-05-30 MED ORDER — SODIUM CHLORIDE 0.9% FLUSH
10.0000 mL | INTRAVENOUS | Status: DC | PRN
  Administered 2016-05-30: 10 mL via INTRAVENOUS
  Filled 2016-05-30: qty 10

## 2016-05-30 MED ORDER — HEPARIN SOD (PORK) LOCK FLUSH 100 UNIT/ML IV SOLN
500.0000 [IU] | Freq: Once | INTRAVENOUS | Status: AC
  Administered 2016-05-30: 500 [IU] via INTRAVENOUS

## 2016-05-30 NOTE — Progress Notes (Signed)
States is feeling well. Discharged from rehab facility approximately 2 weeks ago. Here to discuss if continues to need treatment for ovarian cancer.

## 2016-06-03 NOTE — Progress Notes (Signed)
[name]  [phone] [phone]  ID: Kathryn Chandler OB: 1929-01-05  MR#: [id number]  [id number]  Patient Care Team: Albina Billet, MD as PCP - General (Internal Medicine) [facility] Gaetana Michaelis, MD as Referring Physician (Obstetrics and Gynecology) Maceo Pro, MD as Referring Physician (Obstetrics and Gynecology)  CHIEF COMPLAINT: Stage IIIc left ovarian cancer status post suboptimal debulking and chemotherapy.  INTERVAL HISTORY: Patient is an 82 year old female with the above stated cancer history who was recently discharged from rehabilitation facility approximately 2 weeks ago. She continues to feel significantly weak and fatigued, but states this is improving. She is in clinic today for discussion of any additional treatment necessary for her ovarian cancer. She has no neurological point. She denies any fevers. She denies any chest pain, shortness of breath, cough, or hemoptysis. She has a fair appetite, but denies weight loss. She has no nausea, vomiting, constipation, or diarrhea. She has no urinary complaints. Patient otherwise feels well and offers no further specific complaints.  REVIEW OF SYSTEMS:   Review of Systems  Constitutional: Positive for malaise/fatigue. Negative for fever and weight loss.  Respiratory: Negative.  Negative for cough and shortness of breath.   Cardiovascular: Negative.  Negative for chest pain.  Gastrointestinal: Negative.  Negative for nausea, vomiting, abdominal pain, diarrhea, constipation, blood in stool and melena.  Genitourinary: Negative.   Musculoskeletal: Negative.   Neurological: Positive for weakness.  Psychiatric/Behavioral: Negative.     As per HPI. Otherwise, a complete review of systems is negatve.  PAST MEDICAL HISTORY: Past Medical History  Diagnosis Date  . Glaucoma   . GERD (gastroesophageal reflux disease)   . Hypercholesteremia   . PONV (postoperative nausea and vomiting)     . Hypertension   . Anxiety   . Cancer ([name])   . Ovarian cancer ([name])     PAST SURGICAL HISTORY: Past Surgical History  Procedure Laterality Date  . Abdominal hysterectomy    . Replacement total knee      Right  . Breast biopsy    . Hand surgery      Right  . Laparotomy Left 07/20/2015    Procedure: LAPAROTOMY with Left Salpingooophrectomy;  Surgeon: Honor Loh Ward, MD;  Location: [facility] ORS;  Service: Gynecology;  Laterality: Left;  . Laparotomy N/A 07/20/2015    Procedure: LAPAROTOMY;  Surgeon: Gillis Ends, MD;  Location: [facility] ORS;  Service: Gynecology;  Laterality: N/A;  . Salpingoophorectomy Right 07/20/2015    Procedure: SALPINGO OOPHORECTOMY;  Surgeon: Gillis Ends, MD;  Location: [facility] ORS;  Service: Gynecology;  Laterality: Right;  . Peripheral vascular catheterization N/A 08/10/2015    Procedure: Glori Luis Cath Insertion;  Surgeon: Algernon Huxley, MD;  Location: [facility];  Service: Cardiovascular;  Laterality: N/A;  . Exploratory laparotomy w/ bowel resection  01/11/2016    XL optimal debulking rectosigmoid resection and reanastomosis    FAMILY HISTORY: Family History  Problem Relation Age of Onset  . Stomach cancer Mother   . Osteoporosis Mother        ADVANCED DIRECTIVES:    HEALTH MAINTENANCE: Social History  Substance Use Topics  . Smoking status: Never Smoker   . Smokeless tobacco: Never Used  . Alcohol Use: No     Colonoscopy:  PAP:  Bone density:  Lipid panel:  Allergies  Allergen Reactions  . Clindamycin/Lincomycin Diarrhea and Other (See Comments)    Reaction:  Antibiotic colitis   . Codeine Anxiety    Current Outpatient  Prescriptions  Medication Sig Dispense Refill  . acetaminophen (TYLENOL) 500 MG tablet Take 500-1,000 mg by mouth every 6 (six) hours as needed for mild pain, fever or headache.     . bimatoprost (LUMIGAN) 0.01 % SOLN Place 1 drop into both eyes at bedtime.    . feeding supplement (BOOST / RESOURCE  BREEZE) LIQD Take 1 Container by mouth 3 (three) times daily between meals. 60 Container 0  . fluticasone (FLONASE) 50 MCG/ACT nasal spray Place 2 sprays into both nostrils daily as needed for rhinitis.     Marland Kitchen lidocaine-prilocaine (EMLA) cream Apply 1 application topically as needed (prior to accessing port).    . LORazepam (ATIVAN) 0.5 MG tablet Take 0.5 mg by mouth 2 (two) times daily.     . magnesium oxide (MAG-OX) 400 (241.3 Mg) MG tablet Take 1 tablet (400 mg total) by mouth daily. 30 tablet 0  . meloxicam (MOBIC) 15 MG tablet Take 15 mg by mouth daily.     Marland Kitchen omeprazole (PRILOSEC) 20 MG capsule Take 20 mg by mouth daily.     . ondansetron (ZOFRAN) 4 MG tablet Take 1 tablet (4 mg total) by mouth every 6 (six) hours as needed for nausea. 30 tablet 3  . simvastatin (ZOCOR) 20 MG tablet Take 20 mg by mouth at bedtime.     . vitamin C (ASCORBIC ACID) 500 MG tablet Take 500 mg by mouth daily.     Current Facility-Administered Medications  Medication Dose Route Frequency Provider Last Rate Last Dose  . sodium chloride flush (NS) 0.9 % injection 10 mL  10 mL Intravenous PRN Lloyd Huger, MD   10 mL at 05/30/16 1159   Facility-Administered Medications Ordered in Other Visits  Medication Dose Route Frequency Provider Last Rate Last Dose  . diphenhydrAMINE (BENADRYL) injection 12.5 mg  12.5 mg Intravenous Once Forest Gleason, MD      . methylPREDNISolone sodium succinate (SOLU-MEDROL) 125 mg/2 mL injection 100 mg  100 mg Intravenous Once Forest Gleason, MD        OBJECTIVE: Filed Vitals:   05/30/16 1107  BP: 126/81  Pulse: 89  Temp: 97.5 F (36.4 C)  Resp: 18     Body mass index is 22.81 kg/(m^2).    ECOG FS:1 - Symptomatic but completely ambulatory  General: Thin, no acute distress. Eyes: Pink conjunctiva, anicteric sclera. Lungs: Clear to auscultation bilaterally. Heart: Regular rate and rhythm. No rubs, murmurs, or gallops. Abdomen: Soft, nontender, nondistended. No organomegaly  noted, normoactive bowel sounds. Musculoskeletal: No edema, cyanosis, or clubbing. Neuro: Alert, answering all questions appropriately. Cranial nerves grossly intact. Skin: No rashes or petechiae noted. Psych: Normal affect.   LAB RESULTS:  Lab Results  Component Value Date   NA 136 05/30/2016   K 5.0 05/30/2016   CL 100* 05/30/2016   CO2 29 05/30/2016   GLUCOSE 100* 05/30/2016   BUN 12 05/30/2016   CREATININE 0.68 05/30/2016   CALCIUM 9.1 05/30/2016   PROT 7.7 05/30/2016   ALBUMIN 4.2 05/30/2016   AST 32 05/30/2016   ALT 22 05/30/2016   ALKPHOS 71 05/30/2016   BILITOT 1.1 05/30/2016   GFRNONAA >60 05/30/2016   GFRAA >60 05/30/2016    Lab Results  Component Value Date   WBC 7.6 05/30/2016   NEUTROABS 3.4 05/30/2016   HGB 11.5* 05/30/2016   HCT 34.0* 05/30/2016   MCV 98.4 05/30/2016   PLT 258 05/30/2016    Lab Results  Component Value Date   CA125 4.7 04/05/2016  STUDIES: No results found.  ASSESSMENT: Stage IIIc left ovarian cancer status post suboptimal debulking and chemotherapy.  PLAN:    1. Stage IIIc left ovarian cancer: Patient previously underwent suboptimal debulking as well as adjuvant chemotherapy. She completed 5 cycles of carboplatinum and Abraxane on Apr 13, 2016.  After lengthy discussion with the patient, she does not desire to undergo any further treatment at this time. Will get a restaging CT scan in mid August to assess for any interval change. Patient will then return to clinic 1 to 2 days later to discuss the results.  Approximately 30 minutes was spent in discussion of which greater than 50% was consultation.  Patient expressed understanding and was in agreement with this plan. She also understands that She can call clinic at any time with any questions, concerns, or complaints.     Lloyd Huger, MD   06/03/2016 9:28 AM

## 2016-06-20 ENCOUNTER — Encounter: Payer: Self-pay | Admitting: Obstetrics and Gynecology

## 2016-06-20 ENCOUNTER — Inpatient Hospital Stay: Payer: Medicare Other | Attending: Obstetrics and Gynecology | Admitting: Obstetrics and Gynecology

## 2016-06-20 VITALS — BP 122/79 | HR 76 | Temp 98.0°F | Ht 59.0 in | Wt 115.6 lb

## 2016-06-20 DIAGNOSIS — I10 Essential (primary) hypertension: Secondary | ICD-10-CM | POA: Diagnosis not present

## 2016-06-20 DIAGNOSIS — R54 Age-related physical debility: Secondary | ICD-10-CM

## 2016-06-20 DIAGNOSIS — C569 Malignant neoplasm of unspecified ovary: Secondary | ICD-10-CM

## 2016-06-20 DIAGNOSIS — N823 Fistula of vagina to large intestine: Secondary | ICD-10-CM

## 2016-06-20 DIAGNOSIS — K219 Gastro-esophageal reflux disease without esophagitis: Secondary | ICD-10-CM

## 2016-06-20 DIAGNOSIS — C786 Secondary malignant neoplasm of retroperitoneum and peritoneum: Secondary | ICD-10-CM | POA: Diagnosis not present

## 2016-06-20 DIAGNOSIS — F419 Anxiety disorder, unspecified: Secondary | ICD-10-CM | POA: Diagnosis not present

## 2016-06-20 DIAGNOSIS — Z452 Encounter for adjustment and management of vascular access device: Secondary | ICD-10-CM | POA: Diagnosis not present

## 2016-06-20 DIAGNOSIS — Z90722 Acquired absence of ovaries, bilateral: Secondary | ICD-10-CM | POA: Diagnosis not present

## 2016-06-20 DIAGNOSIS — Z800 Family history of malignant neoplasm of digestive organs: Secondary | ICD-10-CM | POA: Diagnosis not present

## 2016-06-20 DIAGNOSIS — Z90710 Acquired absence of both cervix and uterus: Secondary | ICD-10-CM | POA: Diagnosis not present

## 2016-06-20 DIAGNOSIS — Z9221 Personal history of antineoplastic chemotherapy: Secondary | ICD-10-CM

## 2016-06-20 DIAGNOSIS — Z79899 Other long term (current) drug therapy: Secondary | ICD-10-CM

## 2016-06-20 DIAGNOSIS — E7800 Pure hypercholesterolemia, unspecified: Secondary | ICD-10-CM

## 2016-06-20 NOTE — Progress Notes (Signed)
Patient here for follow up. No changes since last visit.   

## 2016-06-20 NOTE — Progress Notes (Signed)
  Oncology Nurse Navigator Documentation                                                   Chaperoned pelvic exam

## 2016-06-20 NOTE — Progress Notes (Signed)
Gynecologic Oncology Interval Visit   Referring Provider: Dr. Hall Busing  Chief Concern: stage IIIC ovarian cancer, history of rectovaginal fistula s/p rectosigmoid resection and reanastomosis with optimal interval tumor debulking and chemotherapy.   Subjective:  Kathryn Chandler is a 82 y.o. female who is seen initially in consultation from Dr. Hall Busing with stage IIIC HGSC of the ovary undergoing chemotherapy with Dr. Oliva Bustard.   Kathryn Chandler has done since I last saw her. She completed 5 cycles of carboplatinum and Abraxane on Apr 13, 2016. She declined further chemotherapy. She saw Dr. Grayland Ormond on 06/03/2016 and he was going to get repeat imaging in August with repeat CA125 and exam with him at the same time. She presents today for a pelvic exam.   CT scan A/P 04/17/2016 IMPRESSION: 1. Pancolitis c/w C difficile. 2. Ovarian cancer with omental metastases noted previously not clearly seen today. 3. Chronic findings are described above.  In June she was admitted with UTI, C. Diff colitis and dehydration per her report. She has recovered from that episode.    Lab Results  Component Value Date   CA125 4.7 04/05/2016   CA125 5.8 03/21/2016   CA125 6.2 03/06/2016    Gynecologic Oncology History   Kathryn Chandler is a pleasant female who is seen initially in consultation from Dr. Hall Busing with suboptimally debulked stage IIIC HGSC. She underwent laparotomy and LSO with resection of pelvic mass on 07/20/2015 and has stage IIIC ovarian cancer. Her disease was not amendable to debulking to no gross residual. She would have required several bowel resections, diaphragm stripping, and liver mobilization and due to peritoneal carcinomatosis would not have been debulked to R0. Preoperative CA125 74.2 and HE4=118  FINAL PATHOLOGY DIAGNOSIS:  A. LEFT OVARY; OOPHORECTOMY:  - HIGH-GRADE SEROUS CARCINOMA.  - FALLOPIAN TUBE NOT IDENTIFIED.  She has been treated with weekly taxane and carboplatin (initially  paclitaxel but was switched to Abraxane on day 1 and 8 and 15). Bevacizumab was added cycles 2 and 3. She is tolerating her therapy with an excellent decrease in CA125 values. 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 She  status for status post XL on 01/11/2016 with RSO, enterolysis, infragastric omentectomy, mobilization of splenic flexure,  resection of rectosigmoid and repair of fistula, with optimal debulking to < 3 mm. Residual disease is miliary in nature involving small bowel mesentary. Pathology results c/w high grade serous adenocarcinoma except rectosigmoid colon, excision which revealed benign colonic tissue with diverticula.    Genetic testing BRCA mutation was negative   Problem List: Patient Active Problem List   Diagnosis Date Noted  . Renal failure 04/17/2016  . Severe protein-calorie malnutrition Altamease Oiler: less than 60% of standard weight) ([place]) 11/07/2015  . Essential (primary) hypertension 11/05/2015  . Malignant neoplasm of ovary ([place]) 11/05/2015  . Colovaginal fistula 11/04/2015  . Acid reflux 08/03/2015  . Bilateral cataracts 08/03/2015  . Chicken pox 08/03/2015  . Allergic state 08/03/2015  . Breast lump 08/03/2015  . Hypercholesteremia 08/03/2015  . Malignant melanoma ([place]) 08/03/2015  . Ovarian cancer ([place]) 07/20/2015  . Head or neck swelling, mass, or lump 08/17/2014    Past Medical History: Past Medical History:  Diagnosis Date  . Anxiety   . Cancer ([place])   . GERD (gastroesophageal reflux disease)   . Glaucoma   . Hypercholesteremia   . Hypertension   . Ovarian cancer ([place])   . PONV (postoperative nausea and vomiting)     Past Surgical History: Past Surgical History:  Procedure Laterality Date  . ABDOMINAL HYSTERECTOMY    . BREAST BIOPSY    . EXPLORATORY LAPAROTOMY W/ BOWEL RESECTION  01/11/2016   XL optimal debulking rectosigmoid resection and reanastomosis  . HAND SURGERY     Right  . LAPAROTOMY Left 07/20/2015    Procedure: LAPAROTOMY with Left Salpingooophrectomy;  Surgeon: Honor Loh Ward, MD;  Location: [facility];  Service: Gynecology;  Laterality: Left;  . LAPAROTOMY N/A 07/20/2015   Procedure: LAPAROTOMY;  Surgeon: Gillis Ends, MD;  Location: [facility];  Service: Gynecology;  Laterality: N/A;  . PERIPHERAL VASCULAR CATHETERIZATION N/A 08/10/2015   Procedure: Glori Luis Cath Insertion;  Surgeon: Algernon Huxley, MD;  Location: [facility];  Service: Cardiovascular;  Laterality: N/A;  . REPLACEMENT TOTAL KNEE     Right  . SALPINGOOPHORECTOMY Right 07/20/2015   Procedure: SALPINGO OOPHORECTOMY;  Surgeon: Gillis Ends, MD;  Location: [facility];  Service: Gynecology;  Laterality: Right;    Past Gynecologic History:  Menopause: 1976 06/07/2014 Mammogram IMPRESSION: No mammographic evidence of malignancy. A result letter of this screening mammogram will be mailed directly to the patient.   OB History:  OB History  Gravida Para Term Preterm AB Living  3 1     2     SAB TAB Ectopic Multiple Live Births  2            # Outcome Date GA Lbr Len/2nd Weight Sex Delivery Anes PTL Lv  3 Para           2 SAB           1 SAB               Family History: Family History  Problem Relation Age of Onset  . Stomach cancer Mother   . Osteoporosis Mother     Social History: Social History   Social History  . Marital status: Widowed    Spouse name: N/A  . Number of children: N/A  . Years of education: N/A   Occupational History  .      Retired   Social History Main Topics  . Smoking status: Never Smoker  . Smokeless tobacco: Never Used  . Alcohol use No  . Drug use: No  . Sexual activity: Not on file   Other Topics Concern  . Not on file   Social History Narrative  . No narrative on file    Allergies: Allergies  Allergen Reactions  . Clindamycin/Lincomycin Diarrhea and Other (See Comments)    Reaction:  Antibiotic colitis   . Codeine Anxiety    Current  Medications: Current Outpatient Prescriptions  Medication Sig Dispense Refill  . acetaminophen (TYLENOL) 500 MG tablet Take 500-1,000 mg by mouth every 6 (six) hours as needed for mild pain, fever or headache.     . bimatoprost (LUMIGAN) 0.01 % SOLN Place 1 drop into both eyes at bedtime.    . feeding supplement (BOOST / RESOURCE BREEZE) LIQD Take 1 Container by mouth 3 (three) times daily between meals. 60 Container 0  . fluticasone (FLONASE) 50 MCG/ACT nasal spray Place 2 sprays into both nostrils daily as needed for rhinitis.     Marland Kitchen lidocaine-prilocaine (EMLA) cream Apply 1 application topically as needed (prior to accessing port).    . LORazepam (ATIVAN) 0.5 MG tablet Take 0.5 mg by mouth 2 (two) times daily.     . magnesium oxide (MAG-OX) 400 (241.3 Mg) MG tablet Take 1 tablet (400 mg total) by mouth daily.  30 tablet 0  . meloxicam (MOBIC) 15 MG tablet Take 15 mg by mouth daily.     Marland Kitchen omeprazole (PRILOSEC) 20 MG capsule Take 20 mg by mouth daily.     . ondansetron (ZOFRAN) 4 MG tablet Take 1 tablet (4 mg total) by mouth every 6 (six) hours as needed for nausea. 30 tablet 3  . simvastatin (ZOCOR) 20 MG tablet Take 20 mg by mouth at bedtime.     . vitamin C (ASCORBIC ACID) 500 MG tablet Take 500 mg by mouth daily.     No current facility-administered medications for this visit.    Facility-Administered Medications Ordered in Other Visits  Medication Dose Route Frequency Provider Last Rate Last Dose  . diphenhydrAMINE (BENADRYL) injection 12.5 mg  12.5 mg Intravenous Once Forest Gleason, MD      . methylPREDNISolone sodium succinate (SOLU-MEDROL) 125 mg/2 mL injection 100 mg  100 mg Intravenous Once Forest Gleason, MD        Review of Systems General: negative  HEENT: no complaints  Lungs: no complaints  Cardiac: no complaints  GI: no complaints  GU: no complaints  Musculoskeletal: no complaints  Extremities: no complaints  Skin: skin issues  Neuro: no complaints  Endocrine: no  complaints  Psych: no complaints      Objective:  Physical Examination:  BP 122/79 (BP Location: Left Arm, Patient Position: Sitting)   Pulse 76   Temp 98 F (36.7 C) (Tympanic)   Ht 4' 11"  (1.499 m)   Wt 115 lb 10.1 oz (52.4 kg)   BMI 23.35 kg/m   Body mass index is 23.35 kg/m. BMI increased compared to 22.91 on 02/11/2016; baseline BMI 24.83 on 10/26/2015   ECOG Performance Status: 3 - Symptomatic, >50% confined to bed  General appearance: alert, cooperative and appears stated age HEENT:PERRLA, extra ocular movement intact and sclera clear, anicteric Abdomen: soft, nontender and nondistended. No palpable masses or ascites, no hernias and well healed incision  Neurological exam reveals alert, oriented, normal speech, no focal findings or movement disorder noted. Extremities: Negative for edema or cords Neuro: Grossly intact  Pelvic: exam chaperoned by nurse;  Vulva: normal appearing vulva with no masses, tenderness or lesions; Vagina: normal vagina with healing at the cuff; Adnexa: surgically absent; Uterus: surgically absent, vaginal cuff well healed; Cervix: absent; Rectal: not performed.    Assessment:  Kathryn Chandler is a 82 y.o. female diagnosed with stage IIIc high grade serous ovarian cancer s/p suboptimal debulking, chemotherapy s/p chemotherapy with weekly abraxane, carboplatin, and bevacizumab, bowel perforation/RV fistula,  interval optimally debulking with rectosigmoid resection and reanastomosis for rectovaginal fistula and resumption of chemotherapy without bevacizumab. Diverticular disease. History of UTI. History of c.Diff.    Medical co-morbidities complicating care: prior abdominal surgery, frailty, performance status of 3.  Plan:   Problem List Items Addressed This Visit      Genitourinary   Ovarian cancer ([place]) - Primary    Other Visit Diagnoses   None.     She will follow up with Dr. Grayland Ormond as scheduled in a few weeks.   We will start to alternate  visits every 3 months with Dr. Grayland Ormond as long as her next appt, imaging, and CA125 are negative for disease. I will plan to see her in November 2017.   Gillis Ends, MD    CC:  Larey Days, MD  Albina Billet, MD [address]   [place], [place] [address] [phone]

## 2016-07-02 ENCOUNTER — Other Ambulatory Visit: Payer: Self-pay | Admitting: Internal Medicine

## 2016-07-02 DIAGNOSIS — Z1231 Encounter for screening mammogram for malignant neoplasm of breast: Secondary | ICD-10-CM

## 2016-07-04 ENCOUNTER — Inpatient Hospital Stay: Payer: Medicare Other

## 2016-07-04 ENCOUNTER — Ambulatory Visit
Admission: RE | Admit: 2016-07-04 | Discharge: 2016-07-04 | Disposition: A | Discharge status: Home / Self Care | Payer: Medicare Other | Source: Ambulatory Visit | Attending: Oncology | Admitting: Oncology

## 2016-07-04 DIAGNOSIS — C569 Malignant neoplasm of unspecified ovary: Secondary | ICD-10-CM | POA: Diagnosis not present

## 2016-07-04 DIAGNOSIS — C562 Malignant neoplasm of left ovary: Secondary | ICD-10-CM | POA: Diagnosis not present

## 2016-07-04 LAB — CBC WITH DIFFERENTIAL/PLATELET
Basophils Absolute: 0.1 10*3/uL (ref 0–0.1)
Basophils Relative: 1 %
EOS ABS: 0.1 10*3/uL (ref 0–0.7)
Eosinophils Relative: 1 %
HEMATOCRIT: 34.7 % — AB (ref 35.0–47.0)
HEMOGLOBIN: 11.9 g/dL — AB (ref 12.0–16.0)
LYMPHS ABS: 3.9 10*3/uL — AB (ref 1.0–3.6)
Lymphocytes Relative: 39 %
MCH: 33 pg (ref 26.0–34.0)
MCHC: 34.2 g/dL (ref 32.0–36.0)
MCV: 96.5 fL (ref 80.0–100.0)
MONOS PCT: 11 %
Monocytes Absolute: 1.1 10*3/uL — ABNORMAL HIGH (ref 0.2–0.9)
NEUTROS ABS: 4.7 10*3/uL (ref 1.4–6.5)
NEUTROS PCT: 48 %
Platelets: 214 10*3/uL (ref 150–440)
RBC: 3.6 MIL/uL — ABNORMAL LOW (ref 3.80–5.20)
RDW: 13.3 % (ref 11.5–14.5)
WBC: 9.9 10*3/uL (ref 3.6–11.0)

## 2016-07-04 LAB — COMPREHENSIVE METABOLIC PANEL
ALK PHOS: 70 U/L (ref 38–126)
ALT: 14 U/L (ref 14–54)
ANION GAP: 9 (ref 5–15)
AST: 24 U/L (ref 15–41)
Albumin: 4.2 g/dL (ref 3.5–5.0)
BILIRUBIN TOTAL: 0.8 mg/dL (ref 0.3–1.2)
BUN: 11 mg/dL (ref 6–20)
CALCIUM: 8.9 mg/dL (ref 8.9–10.3)
CO2: 27 mmol/L (ref 22–32)
Chloride: 98 mmol/L — ABNORMAL LOW (ref 101–111)
Creatinine, Ser: 0.72 mg/dL (ref 0.44–1.00)
GFR calc non Af Amer: >60 mL/min (ref >60)
Glucose, Bld: 97 mg/dL (ref 65–99)
Potassium: 4.5 mmol/L (ref 3.5–5.1)
Sodium: 134 mmol/L — ABNORMAL LOW (ref 135–145)
TOTAL PROTEIN: 7.8 g/dL (ref 6.5–8.1)

## 2016-07-04 MED ORDER — IOPAMIDOL (ISOVUE-300) INJECTION 61%
75.0000 mL | Freq: Once | INTRAVENOUS | Status: AC | PRN
  Administered 2016-07-04: 75 mL via INTRAVENOUS

## 2016-07-04 NOTE — Progress Notes (Signed)
[facility]  [phone] [phone]  ID: Kathryn Chandler OB: [birth year]  MR#: [id number]  [id number]  Patient Care Team: Albina Billet, MD as PCP - General (Internal Medicine) [facility] Gaetana Michaelis, MD as Referring Physician (Obstetrics and Gynecology) Maceo Pro, MD as Referring Physician (Obstetrics and Gynecology)  CHIEF COMPLAINT: Stage IIIc left ovarian cancer status post suboptimal debulking and chemotherapy.  INTERVAL HISTORY: Patient returns to clinic today for further evaluation and discussion of her imaging results. She feels significantly improved and is nearly back to her baseline. She does not complain of weakness or fatigue today. She has no neurologic complaints. She denies any fevers. She denies any chest pain, shortness of breath, cough, or hemoptysis. She has a fair appetite, but denies weight loss. She has no nausea, vomiting, constipation, or diarrhea. She has no urinary complaints. Patient offers no further specific complaints today.  REVIEW OF SYSTEMS:   Review of Systems  Constitutional: Negative for fever, malaise/fatigue and weight loss.  Respiratory: Negative.  Negative for cough and shortness of breath.   Cardiovascular: Negative.  Negative for chest pain.  Gastrointestinal: Negative.  Negative for abdominal pain, blood in stool, constipation, diarrhea, melena, nausea and vomiting.  Genitourinary: Negative.   Musculoskeletal: Negative.   Neurological: Negative.  Negative for weakness.  Psychiatric/Behavioral: Negative.     As per HPI. Otherwise, a complete review of systems is negatve.  PAST MEDICAL HISTORY: Past Medical History:  Diagnosis Date  . Anxiety   . Cancer ([place])   . GERD (gastroesophageal reflux disease)   . Glaucoma   . Hypercholesteremia   . Hypertension   . Ovarian cancer ([place]) 06/2015   Bilateral Oophorectomies.   Marland Kitchen PONV (postoperative nausea and vomiting)     PAST SURGICAL  HISTORY: Past Surgical History:  Procedure Laterality Date  . ABDOMINAL HYSTERECTOMY    . BREAST BIOPSY    . EXPLORATORY LAPAROTOMY W/ BOWEL RESECTION  01/11/2016   XL optimal debulking rectosigmoid resection and reanastomosis  . HAND SURGERY     Right  . LAPAROTOMY Left 07/20/2015   Procedure: LAPAROTOMY with Left Salpingooophrectomy;  Surgeon: Honor Loh Ward, MD;  Location: [facility];  Service: Gynecology;  Laterality: Left;  . LAPAROTOMY N/A 07/20/2015   Procedure: LAPAROTOMY;  Surgeon: Gillis Ends, MD;  Location: [facility];  Service: Gynecology;  Laterality: N/A;  . PERIPHERAL VASCULAR CATHETERIZATION N/A 08/10/2015   Procedure: Glori Luis Cath Insertion;  Surgeon: Algernon Huxley, MD;  Location: [facility];  Service: Cardiovascular;  Laterality: N/A;  . REPLACEMENT TOTAL KNEE     Right  . SALPINGOOPHORECTOMY Right 07/20/2015   Procedure: SALPINGO OOPHORECTOMY;  Surgeon: Gillis Ends, MD;  Location: [facility];  Service: Gynecology;  Laterality: Right;    FAMILY HISTORY: Family History  Problem Relation Age of Onset  . Stomach cancer Mother   . Osteoporosis Mother        ADVANCED DIRECTIVES:    HEALTH MAINTENANCE: Social History  Substance Use Topics  . Smoking status: Never Smoker  . Smokeless tobacco: Never Used  . Alcohol use No     Colonoscopy:  PAP:  Bone density:  Lipid panel:  Allergies  Allergen Reactions  . Clindamycin/Lincomycin Diarrhea and Other (See Comments)    Reaction:  Antibiotic colitis   . Codeine Anxiety    Current Outpatient Prescriptions  Medication Sig Dispense Refill  . acetaminophen (TYLENOL) 500 MG tablet Take 500-1,000 mg by mouth every 6 (six) hours as needed  for mild pain, fever or headache.     . bimatoprost (LUMIGAN) 0.01 % SOLN Place 1 drop into both eyes at bedtime.    . feeding supplement (BOOST / RESOURCE BREEZE) LIQD Take 1 Container by mouth 3 (three) times daily between meals. 60 Container 0  . fluticasone  (FLONASE) 50 MCG/ACT nasal spray Place 2 sprays into both nostrils daily as needed for rhinitis.     Marland Kitchen lidocaine-prilocaine (EMLA) cream Apply 1 application topically as needed (prior to accessing port).    . LORazepam (ATIVAN) 0.5 MG tablet Take 0.5 mg by mouth 2 (two) times daily.     . magnesium oxide (MAG-OX) 400 (241.3 Mg) MG tablet Take 1 tablet (400 mg total) by mouth daily. 30 tablet 0  . meloxicam (MOBIC) 15 MG tablet Take 15 mg by mouth daily.     Marland Kitchen omeprazole (PRILOSEC) 20 MG capsule Take 20 mg by mouth daily.     . ondansetron (ZOFRAN) 4 MG tablet Take 1 tablet (4 mg total) by mouth every 6 (six) hours as needed for nausea. 30 tablet 3  . simvastatin (ZOCOR) 20 MG tablet Take 20 mg by mouth at bedtime.     . vitamin C (ASCORBIC ACID) 500 MG tablet Take 500 mg by mouth daily.     No current facility-administered medications for this visit.    Facility-Administered Medications Ordered in Other Visits  Medication Dose Route Frequency Provider Last Rate Last Dose  . diphenhydrAMINE (BENADRYL) injection 12.5 mg  12.5 mg Intravenous Once Forest Gleason, MD      . methylPREDNISolone sodium succinate (SOLU-MEDROL) 125 mg/2 mL injection 100 mg  100 mg Intravenous Once Forest Gleason, MD        OBJECTIVE: Vitals:   07/05/16 1344  BP: 135/84  Pulse: 89  Resp: 18  Temp: 97.4 F (36.3 C)     Body mass index is 23.73 kg/m.    ECOG FS:1 - Symptomatic but completely ambulatory  General: Thin, no acute distress. Eyes: Pink conjunctiva, anicteric sclera. Lungs: Clear to auscultation bilaterally. Heart: Regular rate and rhythm. No rubs, murmurs, or gallops. Abdomen: Soft, nontender, nondistended. No organomegaly noted, normoactive bowel sounds. Musculoskeletal: No edema, cyanosis, or clubbing. Neuro: Alert, answering all questions appropriately. Cranial nerves grossly intact. Skin: No rashes or petechiae noted. Psych: Normal affect.   LAB RESULTS:  Lab Results  Component Value Date    NA 134 (L) 07/04/2016   K 4.5 07/04/2016   CL 98 (L) 07/04/2016   CO2 27 07/04/2016   GLUCOSE 97 07/04/2016   BUN 11 07/04/2016   CREATININE 0.72 07/04/2016   CALCIUM 8.9 07/04/2016   PROT 7.8 07/04/2016   ALBUMIN 4.2 07/04/2016   AST 24 07/04/2016   ALT 14 07/04/2016   ALKPHOS 70 07/04/2016   BILITOT 0.8 07/04/2016   GFRNONAA >60 07/04/2016   GFRAA >60 07/04/2016    Lab Results  Component Value Date   WBC 9.9 07/04/2016   NEUTROABS 4.7 07/04/2016   HGB 11.9 (L) 07/04/2016   HCT 34.7 (L) 07/04/2016   MCV 96.5 07/04/2016   PLT 214 07/04/2016    Lab Results  Component Value Date   CA125 5.9 07/04/2016    STUDIES: Ct Abdomen Pelvis W Contrast  Result Date: 07/04/2016 CLINICAL DATA:  Restaging ovarian carcinoma. Chemotherapy completed. Bilateral oophorectomy. EXAM: CT ABDOMEN AND PELVIS WITH CONTRAST TECHNIQUE: Multidetector CT imaging of the abdomen and pelvis was performed using the standard protocol following bolus administration of intravenous contrast. CONTRAST:  54mL ISOVUE-300 IOPAMIDOL (ISOVUE-300) INJECTION 61% COMPARISON:  04/17/2016 FINDINGS: Lower chest: Lung bases are clear. Hepatobiliary: No focal hepatic lesion. No biliary duct dilatation. Gallbladder is normal. Common bile duct is normal. Pancreas: Pancreas is normal. No ductal dilatation. No pancreatic inflammation. Spleen: Normal spleen Adrenals/urinary tract: Adrenal glands and kidneys are normal. The ureters and bladder normal. Stomach/Bowel: Stomach, small bowel cecum normal. Colon rectosigmoid colon normal. Vascular/Lymphatic: Abdominal aorta is normal caliber with atherosclerotic calcification. There is no retroperitoneal or periportal lymphadenopathy. No pelvic lymphadenopathy. Reproductive: Post hysterectomy. Other: No peritoneal nodularity. Musculoskeletal: No aggressive osseous lesion. IMPRESSION: 1. No evidence of ovarian cancer recurrence. 2. No metastatic disease. Electronically Signed   By: Suzy Bouchard M.D.   On: 07/04/2016 16:46    ASSESSMENT: Stage IIIc left ovarian cancer status post suboptimal debulking and chemotherapy.  PLAN:    1. Stage IIIc left ovarian cancer: Patient previously underwent suboptimal debulking as well as adjuvant chemotherapy. She completed 5 cycles of carboplatinum and Abraxane on Apr 13, 2016.  Previously, after lengthy discussion with the patient, she declined any further treatment. Restaging CT scan completed on July 06, 2016 reviewed independently and reported as above with no evidence of recurrence. Her CA-125 is also within normal limits. No intervention is needed at this time. Return to clinic in 3 months with repeat laboratory work and further evaluation. We will likely reimage with CT scan in 6 months in February 2018.   Patient expressed understanding and was in agreement with this plan. She also understands that She can call clinic at any time with any questions, concerns, or complaints.     Lloyd Huger, MD   07/09/2016 8:52 AM

## 2016-07-05 ENCOUNTER — Inpatient Hospital Stay (HOSPITAL_BASED_OUTPATIENT_CLINIC_OR_DEPARTMENT_OTHER): Payer: Medicare Other | Admitting: Oncology

## 2016-07-05 ENCOUNTER — Inpatient Hospital Stay: Payer: Medicare Other

## 2016-07-05 VITALS — BP 135/84 | HR 89 | Temp 97.4°F | Resp 18 | Wt 117.5 lb

## 2016-07-05 DIAGNOSIS — Z9221 Personal history of antineoplastic chemotherapy: Secondary | ICD-10-CM | POA: Diagnosis not present

## 2016-07-05 DIAGNOSIS — Z800 Family history of malignant neoplasm of digestive organs: Secondary | ICD-10-CM

## 2016-07-05 DIAGNOSIS — Z90722 Acquired absence of ovaries, bilateral: Secondary | ICD-10-CM

## 2016-07-05 DIAGNOSIS — N823 Fistula of vagina to large intestine: Secondary | ICD-10-CM

## 2016-07-05 DIAGNOSIS — C569 Malignant neoplasm of unspecified ovary: Secondary | ICD-10-CM | POA: Diagnosis not present

## 2016-07-05 DIAGNOSIS — C786 Secondary malignant neoplasm of retroperitoneum and peritoneum: Secondary | ICD-10-CM

## 2016-07-05 DIAGNOSIS — K219 Gastro-esophageal reflux disease without esophagitis: Secondary | ICD-10-CM

## 2016-07-05 DIAGNOSIS — R54 Age-related physical debility: Secondary | ICD-10-CM

## 2016-07-05 DIAGNOSIS — Z90710 Acquired absence of both cervix and uterus: Secondary | ICD-10-CM

## 2016-07-05 DIAGNOSIS — Z79899 Other long term (current) drug therapy: Secondary | ICD-10-CM

## 2016-07-05 DIAGNOSIS — C562 Malignant neoplasm of left ovary: Secondary | ICD-10-CM

## 2016-07-05 DIAGNOSIS — I10 Essential (primary) hypertension: Secondary | ICD-10-CM

## 2016-07-05 DIAGNOSIS — F419 Anxiety disorder, unspecified: Secondary | ICD-10-CM

## 2016-07-05 DIAGNOSIS — Z95828 Presence of other vascular implants and grafts: Secondary | ICD-10-CM

## 2016-07-05 DIAGNOSIS — E7800 Pure hypercholesterolemia, unspecified: Secondary | ICD-10-CM

## 2016-07-05 LAB — CA 125: CA 125: 5.9 U/mL (ref 0.0–38.1)

## 2016-07-05 MED ORDER — HEPARIN SOD (PORK) LOCK FLUSH 100 UNIT/ML IV SOLN
500.0000 [IU] | Freq: Once | INTRAVENOUS | Status: AC
  Administered 2016-07-05: 500 [IU] via INTRAVENOUS

## 2016-07-05 MED ORDER — SODIUM CHLORIDE 0.9% FLUSH
10.0000 mL | INTRAVENOUS | Status: DC | PRN
  Administered 2016-07-05: 10 mL via INTRAVENOUS
  Filled 2016-07-05: qty 10

## 2016-07-05 MED ORDER — HEPARIN SOD (PORK) LOCK FLUSH 100 UNIT/ML IV SOLN
INTRAVENOUS | Status: AC
  Filled 2016-07-05: qty 5

## 2016-07-05 NOTE — Progress Notes (Signed)
States is feeling well. Offers no complaints. 

## 2016-07-06 NOTE — Progress Notes (Signed)
  Oncology Nurse Navigator Documentation  Navigator Location: [facility] (07/06/16 1000) Navigator Encounter Type: Diagnostic Results (07/06/16 1000)                                          Time Spent with Patient: 15 (07/06/16 1000)   CT WNL. Needs appt for Dr Theora Gianotti in November. CA-125 already ordered for 11/17. Message sent to scheduling to arrange.

## 2016-07-19 ENCOUNTER — Other Ambulatory Visit: Payer: Self-pay | Admitting: Internal Medicine

## 2016-07-19 ENCOUNTER — Ambulatory Visit
Admission: RE | Admit: 2016-07-19 | Discharge: 2016-07-19 | Disposition: A | Discharge status: Home / Self Care | Payer: Medicare Other | Source: Ambulatory Visit | Attending: Internal Medicine | Admitting: Internal Medicine

## 2016-07-19 DIAGNOSIS — Z1231 Encounter for screening mammogram for malignant neoplasm of breast: Secondary | ICD-10-CM

## 2016-08-16 ENCOUNTER — Inpatient Hospital Stay: Payer: Medicare Other

## 2016-08-23 ENCOUNTER — Inpatient Hospital Stay: Payer: Medicare Other | Attending: Internal Medicine

## 2016-08-23 ENCOUNTER — Inpatient Hospital Stay: Payer: Medicare Other

## 2016-08-23 DIAGNOSIS — Z8543 Personal history of malignant neoplasm of ovary: Secondary | ICD-10-CM | POA: Insufficient documentation

## 2016-08-23 DIAGNOSIS — C801 Malignant (primary) neoplasm, unspecified: Secondary | ICD-10-CM

## 2016-08-23 MED ORDER — HEPARIN SOD (PORK) LOCK FLUSH 100 UNIT/ML IV SOLN
500.0000 [IU] | Freq: Once | INTRAVENOUS | Status: AC
  Administered 2016-08-23: 500 [IU] via INTRAVENOUS

## 2016-08-23 MED ORDER — SODIUM CHLORIDE 0.9% FLUSH
10.0000 mL | INTRAVENOUS | Status: DC | PRN
  Administered 2016-08-23: 10 mL via INTRAVENOUS
  Filled 2016-08-23: qty 10

## 2016-08-23 MED ORDER — HEPARIN SOD (PORK) LOCK FLUSH 100 UNIT/ML IV SOLN
INTRAVENOUS | Status: AC
  Filled 2016-08-23: qty 5

## 2016-08-23 NOTE — Progress Notes (Signed)
Survivorship Care Plan visit completed.  Treatment summary reviewed and given to patient.  ASCO answers booklet reviewed and given to patient.  CARE program and Cancer Transitions discussed with patient along with other resources cancer center offers to patients and caregivers.  Patient verbalized understanding.    

## 2016-10-03 ENCOUNTER — Ambulatory Visit: Payer: Medicare Other

## 2016-10-05 ENCOUNTER — Inpatient Hospital Stay: Payer: Medicare Other | Attending: Internal Medicine

## 2016-10-05 ENCOUNTER — Inpatient Hospital Stay: Payer: Medicare Other

## 2016-10-05 DIAGNOSIS — R54 Age-related physical debility: Secondary | ICD-10-CM | POA: Insufficient documentation

## 2016-10-05 DIAGNOSIS — N823 Fistula of vagina to large intestine: Secondary | ICD-10-CM | POA: Diagnosis not present

## 2016-10-05 DIAGNOSIS — E7800 Pure hypercholesterolemia, unspecified: Secondary | ICD-10-CM | POA: Diagnosis not present

## 2016-10-05 DIAGNOSIS — Z9221 Personal history of antineoplastic chemotherapy: Secondary | ICD-10-CM | POA: Diagnosis not present

## 2016-10-05 DIAGNOSIS — Z8543 Personal history of malignant neoplasm of ovary: Secondary | ICD-10-CM | POA: Diagnosis present

## 2016-10-05 DIAGNOSIS — K5790 Diverticulosis of intestine, part unspecified, without perforation or abscess without bleeding: Secondary | ICD-10-CM | POA: Insufficient documentation

## 2016-10-05 DIAGNOSIS — C562 Malignant neoplasm of left ovary: Secondary | ICD-10-CM

## 2016-10-05 DIAGNOSIS — Z87440 Personal history of urinary (tract) infections: Secondary | ICD-10-CM | POA: Insufficient documentation

## 2016-10-05 DIAGNOSIS — Z90722 Acquired absence of ovaries, bilateral: Secondary | ICD-10-CM | POA: Insufficient documentation

## 2016-10-05 DIAGNOSIS — Z8619 Personal history of other infectious and parasitic diseases: Secondary | ICD-10-CM | POA: Insufficient documentation

## 2016-10-05 DIAGNOSIS — N189 Chronic kidney disease, unspecified: Secondary | ICD-10-CM | POA: Diagnosis not present

## 2016-10-05 DIAGNOSIS — I129 Hypertensive chronic kidney disease with stage 1 through stage 4 chronic kidney disease, or unspecified chronic kidney disease: Secondary | ICD-10-CM | POA: Insufficient documentation

## 2016-10-05 DIAGNOSIS — K219 Gastro-esophageal reflux disease without esophagitis: Secondary | ICD-10-CM | POA: Insufficient documentation

## 2016-10-05 DIAGNOSIS — Z79899 Other long term (current) drug therapy: Secondary | ICD-10-CM | POA: Insufficient documentation

## 2016-10-05 DIAGNOSIS — Z90710 Acquired absence of both cervix and uterus: Secondary | ICD-10-CM | POA: Diagnosis not present

## 2016-10-05 DIAGNOSIS — H409 Unspecified glaucoma: Secondary | ICD-10-CM | POA: Insufficient documentation

## 2016-10-05 MED ORDER — SODIUM CHLORIDE 0.9% FLUSH
10.0000 mL | Freq: Once | INTRAVENOUS | Status: AC
  Administered 2016-10-05: 10 mL via INTRAVENOUS
  Filled 2016-10-05: qty 10

## 2016-10-05 MED ORDER — HEPARIN SOD (PORK) LOCK FLUSH 100 UNIT/ML IV SOLN
INTRAVENOUS | Status: AC
  Filled 2016-10-05: qty 5

## 2016-10-05 MED ORDER — HEPARIN SOD (PORK) LOCK FLUSH 100 UNIT/ML IV SOLN
500.0000 [IU] | Freq: Once | INTRAVENOUS | Status: AC
  Administered 2016-10-05: 500 [IU] via INTRAVENOUS

## 2016-10-06 LAB — CA 125: CA 125: 10 U/mL (ref 0.0–38.1)

## 2016-10-08 ENCOUNTER — Ambulatory Visit: Payer: Medicare Other | Admitting: Internal Medicine

## 2016-10-10 ENCOUNTER — Inpatient Hospital Stay (HOSPITAL_BASED_OUTPATIENT_CLINIC_OR_DEPARTMENT_OTHER): Payer: Medicare Other | Admitting: Obstetrics and Gynecology

## 2016-10-10 ENCOUNTER — Inpatient Hospital Stay: Payer: Medicare Other | Admitting: Internal Medicine

## 2016-10-10 VITALS — BP 125/74 | HR 87 | Temp 98.1°F | Resp 18 | Wt 123.7 lb

## 2016-10-10 DIAGNOSIS — R54 Age-related physical debility: Secondary | ICD-10-CM

## 2016-10-10 DIAGNOSIS — H409 Unspecified glaucoma: Secondary | ICD-10-CM

## 2016-10-10 DIAGNOSIS — Z9221 Personal history of antineoplastic chemotherapy: Secondary | ICD-10-CM | POA: Diagnosis not present

## 2016-10-10 DIAGNOSIS — Z87440 Personal history of urinary (tract) infections: Secondary | ICD-10-CM

## 2016-10-10 DIAGNOSIS — C562 Malignant neoplasm of left ovary: Secondary | ICD-10-CM

## 2016-10-10 DIAGNOSIS — Z90710 Acquired absence of both cervix and uterus: Secondary | ICD-10-CM

## 2016-10-10 DIAGNOSIS — K5790 Diverticulosis of intestine, part unspecified, without perforation or abscess without bleeding: Secondary | ICD-10-CM

## 2016-10-10 DIAGNOSIS — Z8543 Personal history of malignant neoplasm of ovary: Secondary | ICD-10-CM | POA: Diagnosis not present

## 2016-10-10 DIAGNOSIS — K219 Gastro-esophageal reflux disease without esophagitis: Secondary | ICD-10-CM

## 2016-10-10 DIAGNOSIS — Z8619 Personal history of other infectious and parasitic diseases: Secondary | ICD-10-CM

## 2016-10-10 DIAGNOSIS — I129 Hypertensive chronic kidney disease with stage 1 through stage 4 chronic kidney disease, or unspecified chronic kidney disease: Secondary | ICD-10-CM

## 2016-10-10 DIAGNOSIS — Z90722 Acquired absence of ovaries, bilateral: Secondary | ICD-10-CM | POA: Diagnosis not present

## 2016-10-10 DIAGNOSIS — E7800 Pure hypercholesterolemia, unspecified: Secondary | ICD-10-CM

## 2016-10-10 DIAGNOSIS — N823 Fistula of vagina to large intestine: Secondary | ICD-10-CM

## 2016-10-10 DIAGNOSIS — C569 Malignant neoplasm of unspecified ovary: Secondary | ICD-10-CM

## 2016-10-10 DIAGNOSIS — N189 Chronic kidney disease, unspecified: Secondary | ICD-10-CM

## 2016-10-10 DIAGNOSIS — Z79899 Other long term (current) drug therapy: Secondary | ICD-10-CM

## 2016-10-10 NOTE — Progress Notes (Signed)
  Oncology Nurse Navigator Documentation Chaperoned pelvic exam. Follow up in 6 months.  Navigator Location: [facility] (10/10/16 1400)   )Navigator Encounter Type: Follow-up Appt (10/10/16 1400)                 Multidisiplinary Clinic Type: GYN (10/10/16 1400)   Patient Visit Type: GynOnc (10/10/16 1400) Treatment Phase: Follow-up (10/10/16 1400)                            Time Spent with Patient: 15 (10/10/16 1400)

## 2016-10-10 NOTE — Progress Notes (Signed)
Patient offers no concerns today. 

## 2016-10-10 NOTE — Progress Notes (Signed)
Gynecologic Oncology Interval Visit   Referring Provider: Dr. Hall Busing  Chief Concern: stage IIIC ovarian cancer, history of rectovaginal fistula s/p rectosigmoid resection and reanastomosis with optimal interval tumor debulking and chemotherapy.   Subjective:  Kathryn Chandler is a 82 y.o. female who is seen initially in consultation from Dr. Hall Busing with stage IIIC HGSC of the ovary.   Kathryn Chandler has done since last visit. She completed 5 cycles of carboplatinum and Abraxane on Apr 13, 2016 with normal CA125.  She declined further chemotherapy. She saw Dr. Grayland Ormond on 06/03/2016 and CT imaging in August was normal.  CA125 last week normal. She presents today for a pelvic exam. No significant new complaints.    Lab Results  Component Value Date   CA125 10.0 10/05/2016   CA125 5.9 07/04/2016   CA125 4.7 04/05/2016    Gynecologic Oncology History   Kathryn Chandler is a pleasant female who is seen initially in consultation from Dr. Hall Busing with suboptimally debulked stage IIIC HGSC. She underwent laparotomy and LSO with resection of pelvic mass on 07/20/2015 and has stage IIIC ovarian cancer. Her disease was not amendable to debulking to no gross residual. She would have required several bowel resections, diaphragm stripping, and liver mobilization and due to peritoneal carcinomatosis would not have been debulked to R0. Preoperative CA125 74.2 and HE4=118  FINAL PATHOLOGY DIAGNOSIS:  A. LEFT OVARY; OOPHORECTOMY:  - HIGH-GRADE SEROUS CARCINOMA.  - FALLOPIAN TUBE NOT IDENTIFIED.  She has been treated with weekly taxane and carboplatin (initially paclitaxel but was switched to Abraxane on day 1 and 8 and 15). Bevacizumab was added cycles 2 and 3. She is tolerating her therapy with an excellent decrease in CA125 values. 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 She  status for status post XL on 01/11/2016 with RSO, enterolysis, infragastric omentectomy, mobilization of  splenic flexure,  resection of rectosigmoid and repair of fistula, with optimal debulking to < 3 mm. Residual disease is miliary in nature involving small bowel mesentary. Pathology results c/w high grade serous adenocarcinoma except rectosigmoid colon, excision which revealed benign colonic tissue with diverticula.  She completed 5 cycles of carboplatinum and Abraxane on Apr 13, 2016 with normal CA125.  She declined further chemotherapy. She saw Dr. Grayland Ormond on 06/03/2016 and CT imaging in August was normal.    In June she was admitted with UTI, C. Diff colitis and dehydration. She has recovered from that episode.  Genetic testing BRCA mutation was negative  Problem List: Patient Active Problem List   Diagnosis Date Noted  . Renal failure 04/17/2016  . Severe protein-calorie malnutrition Altamease Oiler: less than 60% of standard weight) ([place]) 11/07/2015  . Essential (primary) hypertension 11/05/2015  . Colovaginal fistula 11/04/2015  . Acid reflux 08/03/2015  . Bilateral cataracts 08/03/2015  . Chicken pox 08/03/2015  . Allergic state 08/03/2015  . Breast lump 08/03/2015  . Hypercholesteremia 08/03/2015  . Malignant melanoma ([place]) 08/03/2015  . Ovarian cancer ([place]) 07/20/2015  . Head or neck swelling, mass, or lump 08/17/2014    Past Medical History: Past Medical History:  Diagnosis Date  . Anxiety   . Cancer ([place])   . GERD (gastroesophageal reflux disease)   . Glaucoma   . Hypercholesteremia   . Hypertension   . Ovarian cancer ([place]) 06/2015   Bilateral Oophorectomies.   Marland Kitchen PONV (postoperative nausea and vomiting)     Past Surgical History: Past Surgical History:  Procedure Laterality Date  . ABDOMINAL HYSTERECTOMY    .  BREAST BIOPSY Right 1970's  . EXPLORATORY LAPAROTOMY W/ BOWEL RESECTION  01/11/2016   XL optimal debulking rectosigmoid resection and reanastomosis  . HAND SURGERY     Right  . LAPAROTOMY Left 07/20/2015   Procedure: LAPAROTOMY with Left Salpingooophrectomy;   Surgeon: Honor Loh Ward, MD;  Location: [facility];  Service: Gynecology;  Laterality: Left;  . LAPAROTOMY N/A 07/20/2015   Procedure: LAPAROTOMY;  Surgeon: Gillis Ends, MD;  Location: [facility];  Service: Gynecology;  Laterality: N/A;  . PERIPHERAL VASCULAR CATHETERIZATION N/A 08/10/2015   Procedure: Glori Luis Cath Insertion;  Surgeon: Algernon Huxley, MD;  Location: [place] [facility];  Service: Cardiovascular;  Laterality: N/A;  . REPLACEMENT TOTAL KNEE     Right  . SALPINGOOPHORECTOMY Right 07/20/2015   Procedure: SALPINGO OOPHORECTOMY;  Surgeon: Gillis Ends, MD;  Location: [facility];  Service: Gynecology;  Laterality: Right;    Past Gynecologic History:  Menopause: 1976 06/07/2014 Mammogram IMPRESSION: No mammographic evidence of malignancy. A result letter of this screening mammogram will be mailed directly to the patient.   OB History:  OB History  Gravida Para Term Preterm AB Living  3 1     2     SAB TAB Ectopic Multiple Live Births  2            # Outcome Date GA Lbr Len/2nd Weight Sex Delivery Anes PTL Lv  3 Para           2 SAB           1 SAB               Family History: Family History  Problem Relation Age of Onset  . Stomach cancer Mother   . Osteoporosis Mother   . Breast cancer Neg Hx     Social History: Social History   Social History  . Marital status: Widowed    Spouse name: N/A  . Number of children: N/A  . Years of education: N/A   Occupational History  .      Retired   Social History Main Topics  . Smoking status: Never Smoker  . Smokeless tobacco: Never Used  . Alcohol use No  . Drug use: No  . Sexual activity: Not on file   Other Topics Concern  . Not on file   Social History Narrative  . No narrative on file    Allergies: Allergies  Allergen Reactions  . Clindamycin/Lincomycin Diarrhea and Other (See Comments)    Reaction:  Antibiotic colitis   . Codeine Anxiety    Current Medications: Current Outpatient  Prescriptions  Medication Sig Dispense Refill  . acetaminophen (TYLENOL) 500 MG tablet Take 500-1,000 mg by mouth every 6 (six) hours as needed for mild pain, fever or headache.     . bimatoprost (LUMIGAN) 0.01 % SOLN Place 1 drop into both eyes at bedtime.    . fluticasone (FLONASE) 50 MCG/ACT nasal spray Place 2 sprays into both nostrils daily as needed for rhinitis.     Marland Kitchen lidocaine-prilocaine (EMLA) cream Apply 1 application topically as needed (prior to accessing port).    . LORazepam (ATIVAN) 0.5 MG tablet Take 0.5 mg by mouth 2 (two) times daily.     . magnesium oxide (MAG-OX) 400 (241.3 Mg) MG tablet Take 1 tablet (400 mg total) by mouth daily. 30 tablet 0  . meloxicam (MOBIC) 15 MG tablet Take 15 mg by mouth daily.     Marland Kitchen omeprazole (PRILOSEC) 20 MG capsule Take  20 mg by mouth daily.     . ondansetron (ZOFRAN) 4 MG tablet Take 1 tablet (4 mg total) by mouth every 6 (six) hours as needed for nausea. 30 tablet 3  . simvastatin (ZOCOR) 20 MG tablet Take 20 mg by mouth at bedtime.     . vitamin C (ASCORBIC ACID) 500 MG tablet Take 500 mg by mouth daily.    . feeding supplement (BOOST / RESOURCE BREEZE) LIQD Take 1 Container by mouth 3 (three) times daily between meals. (Patient not taking: Reported on 10/10/2016) 60 Container 0   No current facility-administered medications for this visit.    Facility-Administered Medications Ordered in Other Visits  Medication Dose Route Frequency Provider Last Rate Last Dose  . diphenhydrAMINE (BENADRYL) injection 12.5 mg  12.5 mg Intravenous Once Forest Gleason, MD      . methylPREDNISolone sodium succinate (SOLU-MEDROL) 125 mg/2 mL injection 100 mg  100 mg Intravenous Once Forest Gleason, MD        Review of Systems General: negative  HEENT: no complaints  Lungs: no complaints  Cardiac: no complaints  GI: no complaints  GU: no complaints  Musculoskeletal: no complaints  Extremities: no complaints  Skin: skin issues  Neuro: no complaints   Endocrine: no complaints  Psych: no complaints      Objective:  Physical Examination:  BP 125/74 (BP Location: Left Arm, Patient Position: Sitting)   Pulse 87   Temp 98.1 F (36.7 C) (Tympanic)   Resp 18   Wt 123 lb 10.9 oz (56.1 kg)   BMI 24.98 kg/m   Body mass index is 24.98 kg/m.   ECOG Performance Status: 2  General appearance: alert, cooperative and appears stated age HEENT:PERRLA, extra ocular movement intact and sclera clear, anicteric Abdomen: soft, nontender and nondistended. No palpable masses or ascites, no hernias and well healed incision  Neurological exam reveals alert, oriented, normal speech, no focal findings or movement disorder noted. Extremities: Negative for edema or cords Neuro: Grossly intact  Pelvic: exam chaperoned by nurse;  Vulva: normal appearing vulva with no masses, tenderness or lesions; Vagina: normal vagina with healing at the cuff; Adnexa: surgically absent; Uterus: surgically absent, vaginal cuff well healed; Cervix: absent; Rectal: not performed.    Assessment:  Kathryn Chandler is a 82 y.o. female diagnosed with stage IIIc high grade serous ovarian cancer s/p suboptimal debulking, chemotherapy s/p chemotherapy with weekly abraxane, carboplatin, and bevacizumab, bowel perforation/RV fistula,  interval optimally debulking with rectosigmoid resection and reanastomosis for rectovaginal fistula and resumption of chemotherapy without bevacizumab. NED after 5 cycles of chemotherapy.  Medical co-morbidities complicating care: prior abdominal surgery, frailty, diverticular disease. History of UTI. History of c.Diff.     Plan:   Problem List Items Addressed This Visit    None    Visit Diagnoses    Ovarian cancer, left ([place])    -  Primary      She will follow up with Dr. Grayland Ormond in 3 months and we can see her back in 6 months.  Will monitor CA125 levels.  She can return sooner as needed.   Mellody Drown, MD  CC:  Larey Days, MD  Albina Billet, MD [address]   [place], [place] [address] [phone]

## 2016-10-21 NOTE — Progress Notes (Signed)
Kathryn Chandler  [phone] Fax:(336) [phone]  ID: [name] OB: August 23, 1929  MR#: [id number]  [id number]  Patient Care Team: Albina Billet, MD as PCP - General (Internal Medicine) [facility] Gaetana Michaelis, MD as Referring Physician (Obstetrics and Gynecology) Maceo Pro, MD as Referring Physician (Obstetrics and Gynecology) Lloyd Huger, MD as Consulting Physician (Oncology)  CHIEF COMPLAINT: Stage IIIc left ovarian cancer status post suboptimal debulking and chemotherapy.  INTERVAL HISTORY: She returns for follow up for above. She denies any abdominal pain, or swelling , no pedal edema, vaginal bleeding, her appetite is stable, weight is table  She did not feel any breast lumps, no new bone pains, no jaundice, no change in bowel habits.  REVIEW OF SYSTEMS:   Review of Systems  Constitutional: Negative.  Negative for fever and weight loss.  HENT: Negative.   Respiratory: Negative.  Negative for cough and shortness of breath.   Cardiovascular: Negative.  Negative for chest pain.  Gastrointestinal: Negative.  Negative for abdominal pain, blood in stool, constipation, diarrhea, melena, nausea and vomiting.  Genitourinary: Negative.   Musculoskeletal: Negative.   Skin: Negative.   Psychiatric/Behavioral: Negative.     As per HPI. Otherwise, a complete review of systems is negatve.  PAST MEDICAL HISTORY: Past Medical History:  Diagnosis Date  . Anxiety   . Cancer ([place])   . GERD (gastroesophageal reflux disease)   . Glaucoma   . Hypercholesteremia   . Hypertension   . Ovarian cancer ([place]) 06/2015   Bilateral Oophorectomies.   Marland Kitchen PONV (postoperative nausea and vomiting)     PAST SURGICAL HISTORY: Past Surgical History:  Procedure Laterality Date  . ABDOMINAL HYSTERECTOMY    . BREAST BIOPSY Right 1970's  . EXPLORATORY LAPAROTOMY W/ BOWEL RESECTION  01/11/2016   XL optimal debulking rectosigmoid resection and reanastomosis  .  HAND SURGERY     Right  . LAPAROTOMY Left 07/20/2015   Procedure: LAPAROTOMY with Left Salpingooophrectomy;  Surgeon: Honor Loh Ward, MD;  Location: [facility];  Service: Gynecology;  Laterality: Left;  . LAPAROTOMY N/A 07/20/2015   Procedure: LAPAROTOMY;  Surgeon: Gillis Ends, MD;  Location: [facility];  Service: Gynecology;  Laterality: N/A;  . PERIPHERAL VASCULAR CATHETERIZATION N/A 08/10/2015   Procedure: Glori Luis Cath Insertion;  Surgeon: Algernon Huxley, MD;  Location: [place] [facility];  Service: Cardiovascular;  Laterality: N/A;  . REPLACEMENT TOTAL KNEE     Right  . SALPINGOOPHORECTOMY Right 07/20/2015   Procedure: SALPINGO OOPHORECTOMY;  Surgeon: Gillis Ends, MD;  Location: [facility];  Service: Gynecology;  Laterality: Right;    FAMILY HISTORY: Family History  Problem Relation Age of Onset  . Stomach cancer Mother   . Osteoporosis Mother   . Breast cancer Neg Hx        ADVANCED DIRECTIVES:    HEALTH MAINTENANCE: Social History  Substance Use Topics  . Smoking status: Never Smoker  . Smokeless tobacco: Never Used  . Alcohol use No     Colonoscopy:  PAP:  Bone density:  Lipid panel:  Allergies  Allergen Reactions  . Clindamycin/Lincomycin Diarrhea and Other (See Comments)    Reaction:  Antibiotic colitis   . Codeine Anxiety    Current Outpatient Prescriptions  Medication Sig Dispense Refill  . acetaminophen (TYLENOL) 500 MG tablet Take 500-1,000 mg by mouth every 6 (six) hours as needed for mild pain, fever or headache.     . bimatoprost (LUMIGAN) 0.01 % SOLN Place 1 drop into  both eyes at bedtime.    . feeding supplement (BOOST / RESOURCE BREEZE) LIQD Take 1 Container by mouth 3 (three) times daily between meals. (Patient not taking: Reported on 10/10/2016) 60 Container 0  . fluticasone (FLONASE) 50 MCG/ACT nasal spray Place 2 sprays into both nostrils daily as needed for rhinitis.     Marland Kitchen lidocaine-prilocaine (EMLA) cream Apply 1 application  topically as needed (prior to accessing port).    . LORazepam (ATIVAN) 0.5 MG tablet Take 0.5 mg by mouth 2 (two) times daily.     . magnesium oxide (MAG-OX) 400 (241.3 Mg) MG tablet Take 1 tablet (400 mg total) by mouth daily. 30 tablet 0  . meloxicam (MOBIC) 15 MG tablet Take 15 mg by mouth daily.     Marland Kitchen omeprazole (PRILOSEC) 20 MG capsule Take 20 mg by mouth daily.     . ondansetron (ZOFRAN) 4 MG tablet Take 1 tablet (4 mg total) by mouth every 6 (six) hours as needed for nausea. 30 tablet 3  . simvastatin (ZOCOR) 20 MG tablet Take 20 mg by mouth at bedtime.     . vitamin C (ASCORBIC ACID) 500 MG tablet Take 500 mg by mouth daily.     No current facility-administered medications for this visit.    Facility-Administered Medications Ordered in Other Visits  Medication Dose Route Frequency Provider Last Rate Last Dose  . diphenhydrAMINE (BENADRYL) injection 12.5 mg  12.5 mg Intravenous Once Forest Gleason, MD      . methylPREDNISolone sodium succinate (SOLU-MEDROL) 125 mg/2 mL injection 100 mg  100 mg Intravenous Once Forest Gleason, MD       Physical Exam  Constitutional: She is oriented to person, place, and time. She appears well-developed and well-nourished.  HENT:  Head: Normocephalic and atraumatic.  Mouth/Throat: Oropharynx is clear and moist.  Eyes: EOM are normal. Pupils are equal, round, and reactive to light.  Neck: Normal range of motion. Neck supple.  Cardiovascular: Normal rate and regular rhythm.   Pulmonary/Chest: Effort normal and breath sounds normal.  Abdominal: Soft. She exhibits no distension and no mass. There is no tenderness. There is no guarding.  Musculoskeletal: Normal range of motion. She exhibits no edema.  Lymphadenopathy:    She has no cervical adenopathy.  Neurological: She is alert and oriented to person, place, and time.  Skin: Skin is warm. No rash noted. No erythema. No pallor.  Psychiatric: She has a normal mood and affect. Her behavior is normal.    Nursing note and vitals reviewed.  LAB RESULTS: Ca 125 from 10/05/2016 is 10- stbale Her last cbc and cmp from 06/2016 are stable  Lab Results  Component Value Date   NA 134 (L) 07/04/2016   K 4.5 07/04/2016   CL 98 (L) 07/04/2016   CO2 27 07/04/2016   GLUCOSE 97 07/04/2016   BUN 11 07/04/2016   CREATININE 0.72 07/04/2016   CALCIUM 8.9 07/04/2016   PROT 7.8 07/04/2016   ALBUMIN 4.2 07/04/2016   AST 24 07/04/2016   ALT 14 07/04/2016   ALKPHOS 70 07/04/2016   BILITOT 0.8 07/04/2016   GFRNONAA >60 07/04/2016   GFRAA >60 07/04/2016    Lab Results  Component Value Date   WBC 9.9 07/04/2016   NEUTROABS 4.7 07/04/2016   HGB 11.9 (L) 07/04/2016   HCT 34.7 (L) 07/04/2016   MCV 96.5 07/04/2016   PLT 214 07/04/2016    Lab Results  Component Value Date   CA125 10.0 10/05/2016    ASSESSMENT: Stage  IIIc left ovarian cancer status post suboptimal debulking and chemotherapy.NED today.  PLAN:    1. Stage IIIc left ovarian cancer: Patient previously underwent suboptimal debulking as well as adjuvant chemotherapy. She completed 5 cycles of carboplatinum and Abraxane on Apr 13, 2016.   She remains in [place]. She has been seen today by Gyn Onc Dr. Fransisca Connors and had a pelvic exam reporetedly normal  Her breast exam today is unremarkable Last mammogram from 07/2016 was [place] needing annual f/u in 07/2017  Her cbc cmp and ca 125 are stable   Will get a restaging CT scan in 3 months and return to see Dr. Grayland Ormond after     Patient expressed understanding and was in agreement with this plan. She also understands that She can call clinic at any time with any questions, concerns, or complaints.     Creola Corn, MD   10/21/2016 1:20 PM

## 2016-11-16 ENCOUNTER — Inpatient Hospital Stay: Payer: Medicare Other | Attending: Oncology

## 2016-11-16 DIAGNOSIS — Z452 Encounter for adjustment and management of vascular access device: Secondary | ICD-10-CM | POA: Insufficient documentation

## 2016-11-16 DIAGNOSIS — Z95828 Presence of other vascular implants and grafts: Secondary | ICD-10-CM

## 2016-11-16 DIAGNOSIS — Z8543 Personal history of malignant neoplasm of ovary: Secondary | ICD-10-CM | POA: Diagnosis present

## 2016-11-16 MED ORDER — HEPARIN SOD (PORK) LOCK FLUSH 100 UNIT/ML IV SOLN
500.0000 [IU] | Freq: Once | INTRAVENOUS | Status: AC
  Administered 2016-11-16: 500 [IU] via INTRAVENOUS

## 2016-11-16 MED ORDER — SODIUM CHLORIDE 0.9% FLUSH
10.0000 mL | INTRAVENOUS | Status: DC | PRN
  Administered 2016-11-16: 10 mL via INTRAVENOUS
  Filled 2016-11-16: qty 10

## 2016-12-06 IMAGING — CT CT ABD-PELV W/ CM
2 of 5 series · 13 of 46 positions shown, 15 images · IV contrast (omnipaque)
Comparison: 07/04/2015 pelvic ultrasound.  No prior CT.

CLINICAL DATA: Vaginal discharge for 2 days. Newly diagnosed stage
IIIC ovarian cancer. Prior surgery and ongoing chemotherapy.

EXAM:
CT CHEST, ABDOMEN, AND PELVIS WITH CONTRAST
TECHNIQUE: Multidetector CT imaging of the chest, abdomen and pelvis was
performed following the standard protocol during bolus
administration of intravenous contrast.
CONTRAST:  85mL OMNIPAQUE IOHEXOL 300 MG/ML  SOLN

[Series 2: cap with · axial · 0.62mm/px · z∈[-1172,-648]mm · 10 of 119 slices shown, 12 images]
[im 7/119  soft-tissue]
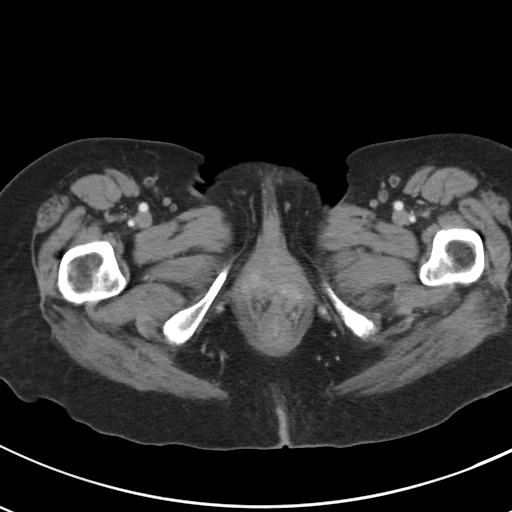
[im 7/119  bone]
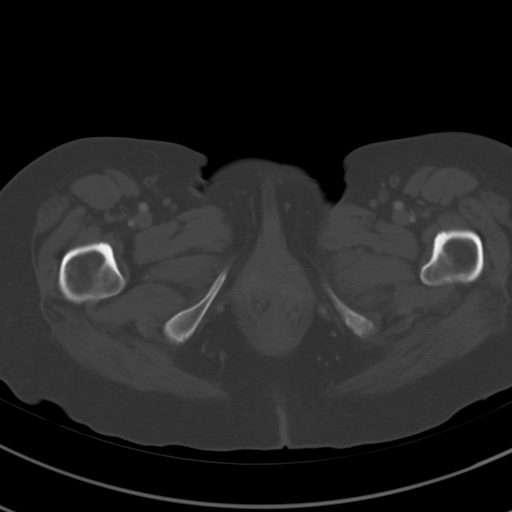
[im 21/119  soft-tissue]
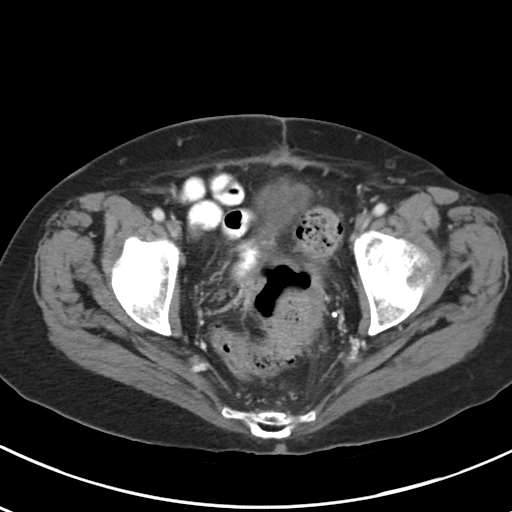
[im 35/119  soft-tissue]
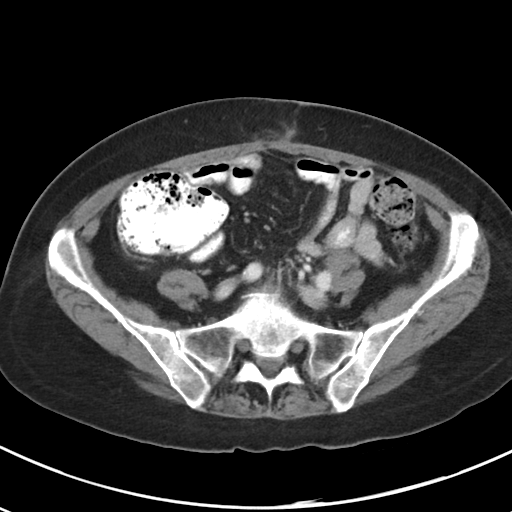
[im 42/119  soft-tissue]
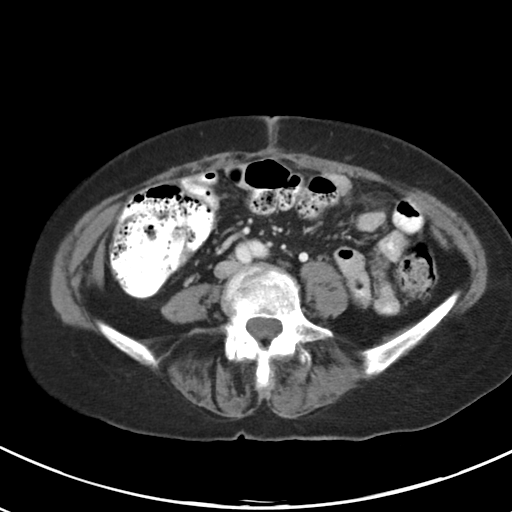
[im 56/119  soft-tissue]
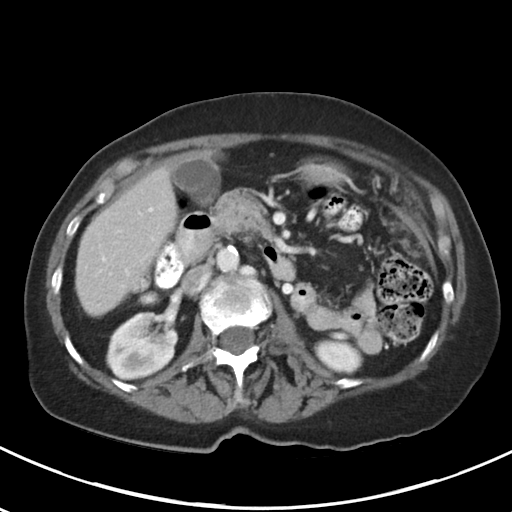
[im 63/119  soft-tissue]
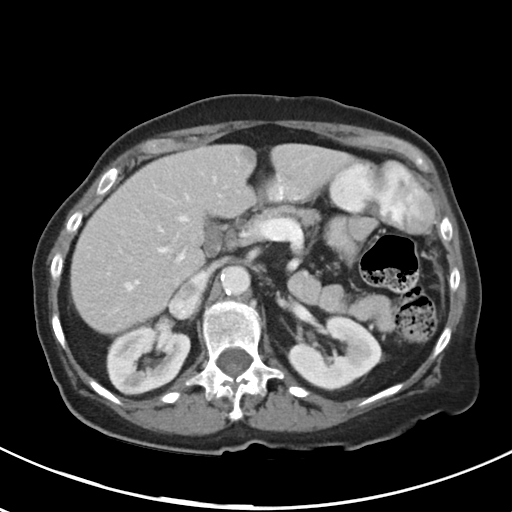
[im 77/119  soft-tissue]
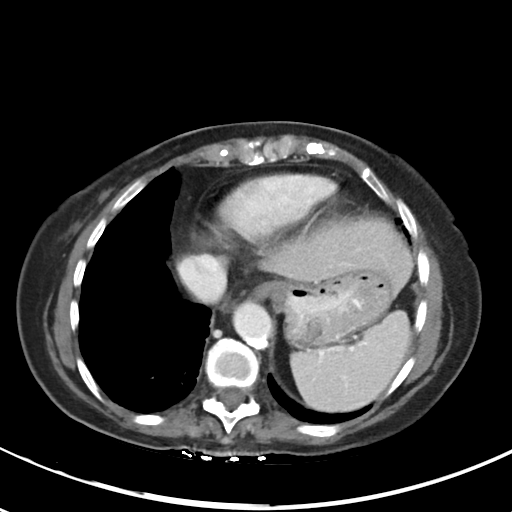
[im 91/119  soft-tissue]
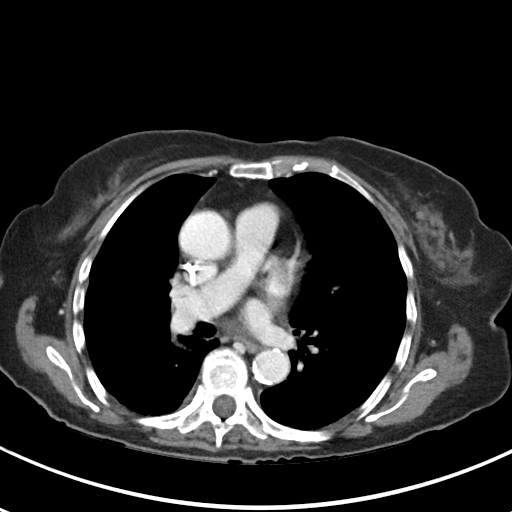
[im 98/119  soft-tissue]
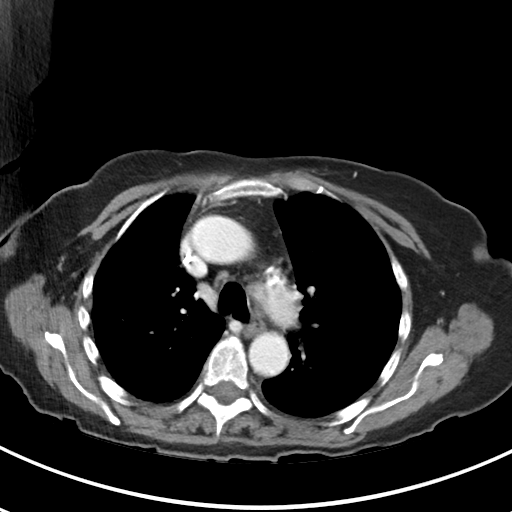
[im 98/119  bone]
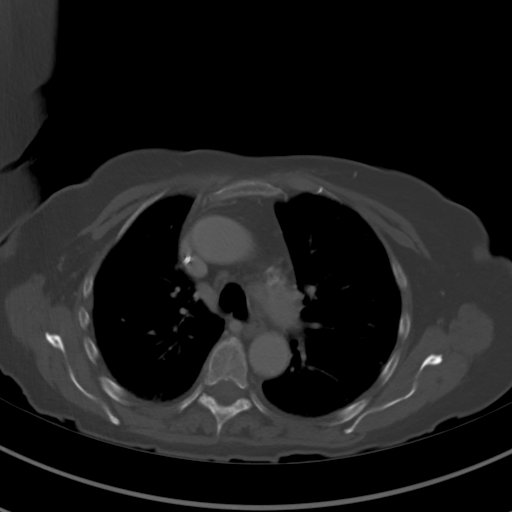
[im 112/119  soft-tissue]
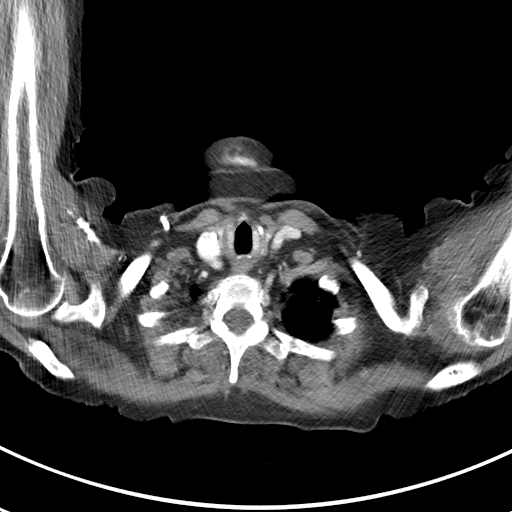

[Series 5: cor cap with · coronal · 0.65mm/px · 3 of 119 slices shown]
[im 40/119  soft-tissue]
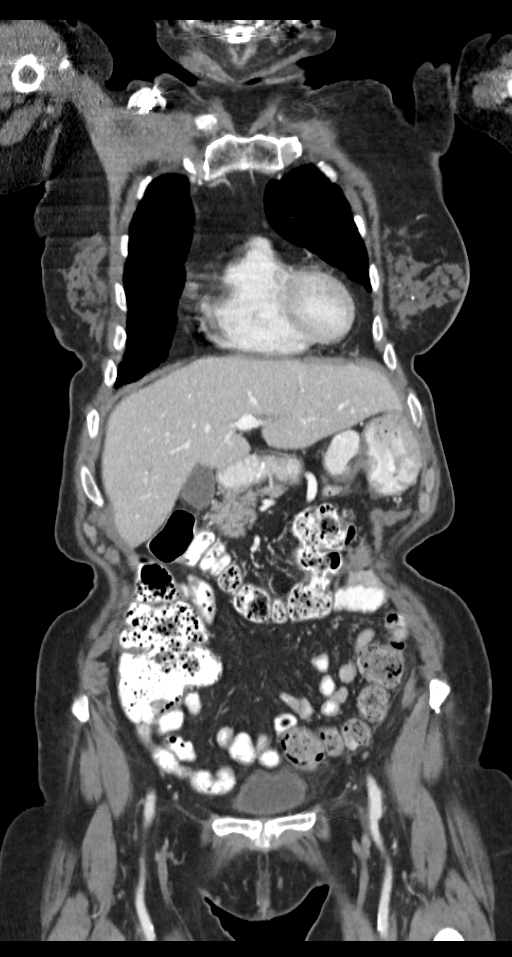
[im 53/119  soft-tissue]
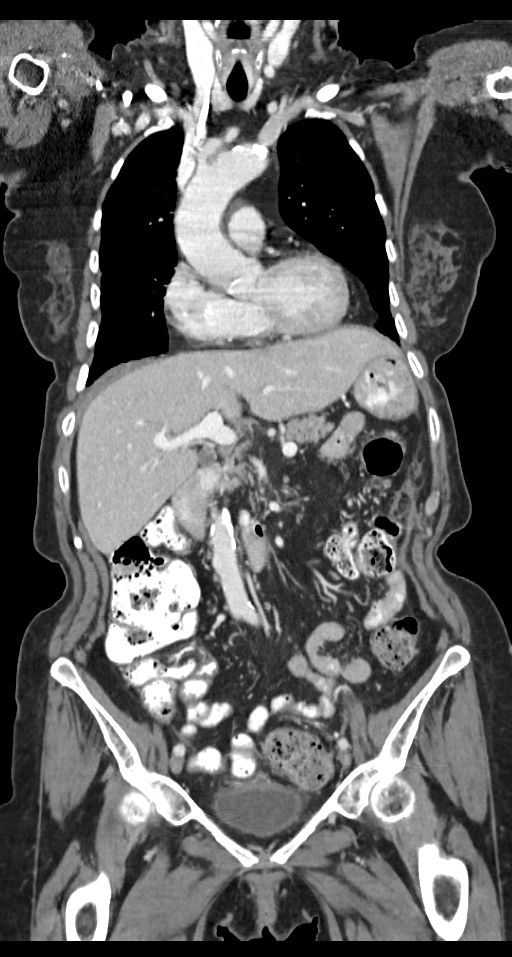
[im 66/119  soft-tissue]
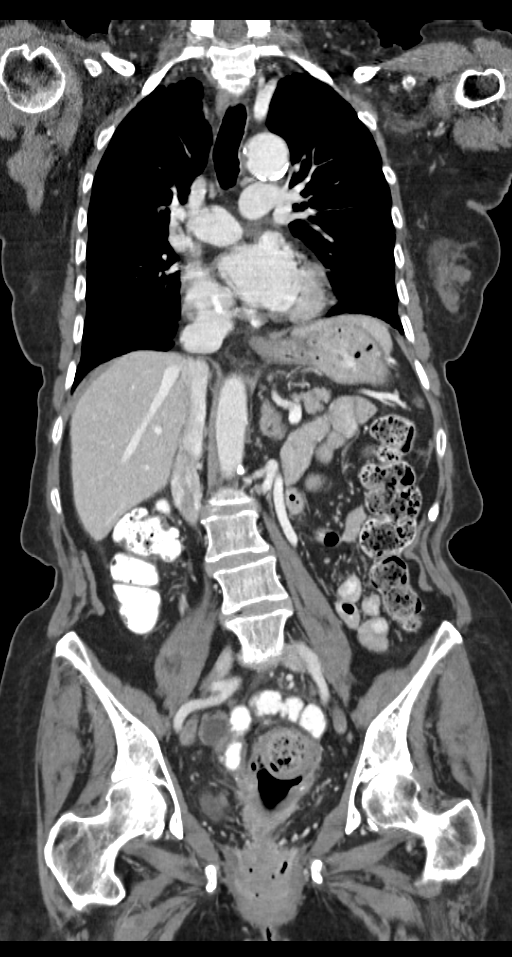

[13 of 46 positions shown; findings below may reference images not displayed]

FINDINGS: CT CHEST FINDINGS

Mediastinum/Nodes: No supraclavicular adenopathy. A right-sided
Port-A-Cath terminates in the mid right atrium. Aortic and branch
vessel atherosclerosis. Tortuous descending thoracic aorta. Moderate
cardiomegaly, without pericardial effusion. No central pulmonary
embolism, on this non-dedicated study. Mild ductus diverticulum
including on image 21/series 2. No mediastinal or hilar adenopathy.

Lungs/Pleura: No pleural fluid. Mild diffuse nonspecific subpleural
interstitial prominence.

Left lower lobe subpleural pulmonary nodule measures 4 mm on image
37/series 4.

Musculoskeletal: No acute osseous abnormality.

CT ABDOMEN PELVIS FINDINGS

Hepatobiliary: Focal steatosis adjacent the falciform ligament. No
suspicious liver lesion. Normal gallbladder, without biliary ductal
dilatation.

Pancreas: Normal, without mass or ductal dilatation. A periampullary
duodenal diverticulum is incidentally noted.

Spleen: Normal in size, without focal abnormality.

Adrenals/Urinary Tract: Normal adrenal glands. Too small to
characterize right renal lesion. Lower pole right renal scarring.
Normal left kidney, without hydronephrosis. Normal urinary bladder.

Stomach/Bowel: Stool-filled sigmoid colon with small volume
diverticula. No bowel obstruction. Normal terminal ileum. Normal
small bowel.

Vascular/Lymphatic: Aortic and branch vessel atherosclerosis.
Circumaortic left renal vein. No retroperitoneal or retrocrural
adenopathy. No pelvic sidewall adenopathy.

Reproductive: Hysterectomy. 1.5 cm fluid density structure in the
right hemipelvis is likely a residual follicle.

Other: Fluid and gas collection within the pelvic cul-de-sac. This
is intimately associated with the sigmoid colon and tracks towards
the vaginal fornix. This measures on the order of 4.8 x 5.4 cm on
image 100/series 2. Concurrent air within the vagina.

Omental thickening and mild nodularity, including within the left
side of the abdomen on image 67/series 2. 9 mm.

Musculoskeletal: Degenerative disc disease at the lumbosacral
junction. Convex right thoracolumbar spine curvature.
IMPRESSION: 1. Gas and fluid collection within the pelvic cul-de-sac, consistent
with abscess. Given the clinical history, and presence of air in the
vagina, this likely communicates with the vagina. Given intimately
associated shin with the sigmoid colon, colovaginal fistula is a
concern.
2. Omental/peritoneal metastasis.  No abdominal pelvic adenopathy.
3. Nonspecific left lower lobe pulmonary nodule. Otherwise, no
evidence of metastatic disease within the chest.
4.  Atherosclerosis, including within the coronary arteries.
These results will be called to the ordering clinician or
representative by the Radiologist Assistant, and communication
documented in the PACS or zVision Dashboard.

## 2016-12-25 ENCOUNTER — Other Ambulatory Visit: Payer: Self-pay | Admitting: Oncology

## 2016-12-25 DIAGNOSIS — C569 Malignant neoplasm of unspecified ovary: Secondary | ICD-10-CM

## 2016-12-28 ENCOUNTER — Inpatient Hospital Stay: Payer: Medicare Other | Attending: Oncology

## 2016-12-28 ENCOUNTER — Other Ambulatory Visit: Payer: Self-pay | Admitting: *Deleted

## 2016-12-28 DIAGNOSIS — K8020 Calculus of gallbladder without cholecystitis without obstruction: Secondary | ICD-10-CM | POA: Diagnosis not present

## 2016-12-28 DIAGNOSIS — R918 Other nonspecific abnormal finding of lung field: Secondary | ICD-10-CM | POA: Diagnosis not present

## 2016-12-28 DIAGNOSIS — Z800 Family history of malignant neoplasm of digestive organs: Secondary | ICD-10-CM | POA: Insufficient documentation

## 2016-12-28 DIAGNOSIS — I700 Atherosclerosis of aorta: Secondary | ICD-10-CM | POA: Diagnosis not present

## 2016-12-28 DIAGNOSIS — C439 Malignant melanoma of skin, unspecified: Secondary | ICD-10-CM

## 2016-12-28 DIAGNOSIS — Z79899 Other long term (current) drug therapy: Secondary | ICD-10-CM | POA: Insufficient documentation

## 2016-12-28 DIAGNOSIS — I10 Essential (primary) hypertension: Secondary | ICD-10-CM | POA: Diagnosis not present

## 2016-12-28 DIAGNOSIS — H539 Unspecified visual disturbance: Secondary | ICD-10-CM | POA: Insufficient documentation

## 2016-12-28 DIAGNOSIS — Z90710 Acquired absence of both cervix and uterus: Secondary | ICD-10-CM | POA: Diagnosis not present

## 2016-12-28 DIAGNOSIS — Z8543 Personal history of malignant neoplasm of ovary: Secondary | ICD-10-CM | POA: Insufficient documentation

## 2016-12-28 DIAGNOSIS — E7800 Pure hypercholesterolemia, unspecified: Secondary | ICD-10-CM | POA: Insufficient documentation

## 2016-12-28 DIAGNOSIS — C569 Malignant neoplasm of unspecified ovary: Secondary | ICD-10-CM

## 2016-12-28 DIAGNOSIS — I2510 Atherosclerotic heart disease of native coronary artery without angina pectoris: Secondary | ICD-10-CM | POA: Insufficient documentation

## 2016-12-28 DIAGNOSIS — Z90722 Acquired absence of ovaries, bilateral: Secondary | ICD-10-CM | POA: Diagnosis not present

## 2016-12-28 DIAGNOSIS — Z452 Encounter for adjustment and management of vascular access device: Secondary | ICD-10-CM | POA: Diagnosis not present

## 2016-12-28 DIAGNOSIS — F419 Anxiety disorder, unspecified: Secondary | ICD-10-CM | POA: Insufficient documentation

## 2016-12-28 DIAGNOSIS — Z95828 Presence of other vascular implants and grafts: Secondary | ICD-10-CM

## 2016-12-28 DIAGNOSIS — K219 Gastro-esophageal reflux disease without esophagitis: Secondary | ICD-10-CM | POA: Insufficient documentation

## 2016-12-28 LAB — COMPREHENSIVE METABOLIC PANEL
ALT: 15 U/L (ref 14–54)
ANION GAP: 7 (ref 5–15)
AST: 28 U/L (ref 15–41)
Albumin: 3.8 g/dL (ref 3.5–5.0)
Alkaline Phosphatase: 50 U/L (ref 38–126)
BUN: 17 mg/dL (ref 6–20)
CALCIUM: 8.9 mg/dL (ref 8.9–10.3)
CHLORIDE: 99 mmol/L — AB (ref 101–111)
CO2: 27 mmol/L (ref 22–32)
CREATININE: 0.73 mg/dL (ref 0.44–1.00)
Glucose, Bld: 129 mg/dL — ABNORMAL HIGH (ref 65–99)
Potassium: 4.1 mmol/L (ref 3.5–5.1)
Sodium: 133 mmol/L — ABNORMAL LOW (ref 135–145)
Total Bilirubin: 1.1 mg/dL (ref 0.3–1.2)
Total Protein: 7.4 g/dL (ref 6.5–8.1)

## 2016-12-28 LAB — CBC WITH DIFFERENTIAL/PLATELET
Basophils Absolute: 0.1 10*3/uL (ref 0–0.1)
Basophils Relative: 1 %
EOS PCT: 1 %
Eosinophils Absolute: 0.1 10*3/uL (ref 0–0.7)
HCT: 34.2 % — ABNORMAL LOW (ref 35.0–47.0)
Hemoglobin: 11.6 g/dL — ABNORMAL LOW (ref 12.0–16.0)
LYMPHS ABS: 2.8 10*3/uL (ref 1.0–3.6)
LYMPHS PCT: 33 %
MCH: 30.1 pg (ref 26.0–34.0)
MCHC: 33.8 g/dL (ref 32.0–36.0)
MCV: 89 fL (ref 80.0–100.0)
MONO ABS: 0.9 10*3/uL (ref 0.2–0.9)
MONOS PCT: 11 %
Neutro Abs: 4.5 10*3/uL (ref 1.4–6.5)
Neutrophils Relative %: 54 %
PLATELETS: 291 10*3/uL (ref 150–440)
RBC: 3.85 MIL/uL (ref 3.80–5.20)
RDW: 14.6 % — ABNORMAL HIGH (ref 11.5–14.5)
WBC: 8.4 10*3/uL (ref 3.6–11.0)

## 2016-12-28 MED ORDER — SODIUM CHLORIDE 0.9% FLUSH
10.0000 mL | INTRAVENOUS | Status: DC | PRN
  Administered 2016-12-28: 10 mL via INTRAVENOUS
  Filled 2016-12-28: qty 10

## 2016-12-28 MED ORDER — HEPARIN SOD (PORK) LOCK FLUSH 100 UNIT/ML IV SOLN
500.0000 [IU] | Freq: Once | INTRAVENOUS | Status: AC
  Administered 2016-12-28: 500 [IU] via INTRAVENOUS

## 2016-12-29 LAB — CA 125: CA 125: 21.6 U/mL (ref 0.0–38.1)

## 2016-12-31 ENCOUNTER — Encounter: Payer: Self-pay | Admitting: Oncology

## 2017-01-02 ENCOUNTER — Telehealth: Payer: Self-pay | Admitting: *Deleted

## 2017-01-02 ENCOUNTER — Ambulatory Visit: Payer: Medicare Other

## 2017-01-02 ENCOUNTER — Ambulatory Visit
Admission: RE | Admit: 2017-01-02 | Discharge: 2017-01-02 | Disposition: A | Discharge status: Home / Self Care | Payer: Medicare Other | Source: Ambulatory Visit | Attending: Internal Medicine | Admitting: Internal Medicine

## 2017-01-02 DIAGNOSIS — I2510 Atherosclerotic heart disease of native coronary artery without angina pectoris: Secondary | ICD-10-CM | POA: Diagnosis not present

## 2017-01-02 DIAGNOSIS — I700 Atherosclerosis of aorta: Secondary | ICD-10-CM | POA: Insufficient documentation

## 2017-01-02 DIAGNOSIS — C569 Malignant neoplasm of unspecified ovary: Secondary | ICD-10-CM | POA: Insufficient documentation

## 2017-01-02 DIAGNOSIS — K8020 Calculus of gallbladder without cholecystitis without obstruction: Secondary | ICD-10-CM | POA: Diagnosis not present

## 2017-01-02 MED ORDER — IOPAMIDOL (ISOVUE-300) INJECTION 61%
100.0000 mL | Freq: Once | INTRAVENOUS | Status: AC | PRN
  Administered 2017-01-02: 100 mL via INTRAVENOUS

## 2017-01-02 NOTE — Telephone Encounter (Signed)
error 

## 2017-01-02 NOTE — Progress Notes (Signed)
[place]  [phone]) [phone] [phone]) [phone]  ID: Kathryn Chandler OB: 03-10-1929  MR#: [id number]  [id number]  Patient Care Team: Albina Billet, MD as PCP - General (Internal Medicine) [facility] Gaetana Michaelis, MD as Referring Physician (Obstetrics and Gynecology) Maceo Pro, MD as Referring Physician (Obstetrics and Gynecology) Lloyd Huger, MD as Consulting Physician (Oncology)  CHIEF COMPLAINT: Stage IIIc left ovarian cancer status post suboptimal debulking and chemotherapy.  INTERVAL HISTORY: Patient returns to clinic today for further evaluation and discussion of her imaging results. She recently has had some visual issues therefore CT of the head was added to her imaging. She currently feels well and is asymptomatic. She does not complain of weakness or fatigue today. She has no neurologic complaints. She denies any fevers. She denies any chest pain, shortness of breath, cough, or hemoptysis. She has a fair appetite, but denies weight loss. She has no nausea, vomiting, constipation, or diarrhea. She has no urinary complaints. Patient offers no specific complaints today.  REVIEW OF SYSTEMS:   Review of Systems  Constitutional: Negative for fever, malaise/fatigue and weight loss.  Eyes: Positive for blurred vision.  Respiratory: Negative.  Negative for cough and shortness of breath.   Cardiovascular: Negative.  Negative for chest pain and leg swelling.  Gastrointestinal: Negative.  Negative for abdominal pain, blood in stool, constipation, diarrhea, melena, nausea and vomiting.  Genitourinary: Negative.   Musculoskeletal: Negative.   Neurological: Negative.  Negative for weakness.  Psychiatric/Behavioral: Negative.  The patient is not nervous/anxious.     As per HPI. Otherwise, a complete review of systems is negatve.  PAST MEDICAL HISTORY: Past Medical History:  Diagnosis Date  . Anxiety   . Cancer ([place])   . GERD (gastroesophageal  reflux disease)   . Glaucoma   . Hypercholesteremia   . Hypertension   . Ovarian cancer ([place]) 06/2015   Bilateral Oophorectomies.   Marland Kitchen PONV (postoperative nausea and vomiting)     PAST SURGICAL HISTORY: Past Surgical History:  Procedure Laterality Date  . ABDOMINAL HYSTERECTOMY    . BREAST BIOPSY Right 1970's  . EXPLORATORY LAPAROTOMY W/ BOWEL RESECTION  01/11/2016   XL optimal debulking rectosigmoid resection and reanastomosis  . HAND SURGERY     Right  . LAPAROTOMY Left 07/20/2015   Procedure: LAPAROTOMY with Left Salpingooophrectomy;  Surgeon: Honor Loh Ward, MD;  Location: [facility];  Service: Gynecology;  Laterality: Left;  . LAPAROTOMY N/A 07/20/2015   Procedure: LAPAROTOMY;  Surgeon: Gillis Ends, MD;  Location: [facility];  Service: Gynecology;  Laterality: N/A;  . PERIPHERAL VASCULAR CATHETERIZATION N/A 08/10/2015   Procedure: Glori Luis Cath Insertion;  Surgeon: Algernon Huxley, MD;  Location: [place] [facility];  Service: Cardiovascular;  Laterality: N/A;  . REPLACEMENT TOTAL KNEE     Right  . SALPINGOOPHORECTOMY Right 07/20/2015   Procedure: SALPINGO OOPHORECTOMY;  Surgeon: Gillis Ends, MD;  Location: [facility];  Service: Gynecology;  Laterality: Right;    FAMILY HISTORY: Family History  Problem Relation Age of Onset  . Stomach cancer Mother   . Osteoporosis Mother   . Breast cancer Neg Hx        ADVANCED DIRECTIVES:    HEALTH MAINTENANCE: Social History  Substance Use Topics  . Smoking status: Never Smoker  . Smokeless tobacco: Never Used  . Alcohol use No     Colonoscopy:  PAP:  Bone density:  Lipid panel:  Allergies  Allergen Reactions  . Clindamycin/Lincomycin Diarrhea and Other (See  Comments)    Reaction:  Antibiotic colitis   . Codeine Anxiety    Current Outpatient Prescriptions  Medication Sig Dispense Refill  . acetaminophen (TYLENOL) 500 MG tablet Take 500-1,000 mg by mouth every 6 (six) hours as needed for mild pain, fever or  headache.     . bimatoprost (LUMIGAN) 0.01 % SOLN Place 1 drop into both eyes at bedtime.    . fluticasone (FLONASE) 50 MCG/ACT nasal spray Place 2 sprays into both nostrils daily as needed for rhinitis.     Marland Kitchen lidocaine-prilocaine (EMLA) cream Apply 1 application topically as needed (prior to accessing port).    . LORazepam (ATIVAN) 0.5 MG tablet Take 0.5 mg by mouth 2 (two) times daily.     . meloxicam (MOBIC) 15 MG tablet Take 15 mg by mouth daily.     Marland Kitchen omeprazole (PRILOSEC) 20 MG capsule Take 20 mg by mouth daily.     . simvastatin (ZOCOR) 20 MG tablet Take 20 mg by mouth at bedtime.     . ondansetron (ZOFRAN) 4 MG tablet Take 1 tablet (4 mg total) by mouth every 6 (six) hours as needed for nausea. (Patient not taking: Reported on 01/03/2017) 30 tablet 3   No current facility-administered medications for this visit.    Facility-Administered Medications Ordered in Other Visits  Medication Dose Route Frequency Provider Last Rate Last Dose  . diphenhydrAMINE (BENADRYL) injection 12.5 mg  12.5 mg Intravenous Once Forest Gleason, MD      . methylPREDNISolone sodium succinate (SOLU-MEDROL) 125 mg/2 mL injection 100 mg  100 mg Intravenous Once Forest Gleason, MD        OBJECTIVE: Vitals:   01/03/17 1535  BP: 133/84  Pulse: 81  Temp: 98 F (36.7 C)     Body mass index is 24.18 kg/m.    ECOG FS:1 - Symptomatic but completely ambulatory  General: Thin, no acute distress. Eyes: Pink conjunctiva, anicteric sclera. Lungs: Clear to auscultation bilaterally. Heart: Regular rate and rhythm. No rubs, murmurs, or gallops. Abdomen: Soft, nontender, nondistended. No organomegaly noted, normoactive bowel sounds. Musculoskeletal: No edema, cyanosis, or clubbing. Neuro: Alert, answering all questions appropriately. Cranial nerves grossly intact. Skin: No rashes or petechiae noted. Psych: Normal affect.   LAB RESULTS:  Lab Results  Component Value Date   NA 133 (L) 12/28/2016   K 4.1 12/28/2016    CL 99 (L) 12/28/2016   CO2 27 12/28/2016   GLUCOSE 129 (H) 12/28/2016   BUN 17 12/28/2016   CREATININE 0.73 12/28/2016   CALCIUM 8.9 12/28/2016   PROT 7.4 12/28/2016   ALBUMIN 3.8 12/28/2016   AST 28 12/28/2016   ALT 15 12/28/2016   ALKPHOS 50 12/28/2016   BILITOT 1.1 12/28/2016   GFRNONAA >60 12/28/2016   GFRAA >60 12/28/2016    Lab Results  Component Value Date   WBC 8.4 12/28/2016   NEUTROABS 4.5 12/28/2016   HGB 11.6 (L) 12/28/2016   HCT 34.2 (L) 12/28/2016   MCV 89.0 12/28/2016   PLT 291 12/28/2016    Lab Results  Component Value Date   CA125 21.6 12/28/2016    STUDIES: Ct Head W Wo Contrast  Result Date: 01/02/2017 CLINICAL DATA:  83 y/o F; restaging ovarian cancer. New stroke-like symptoms. EXAM: CT HEAD WITHOUT AND WITH CONTRAST TECHNIQUE: Contiguous axial images were obtained from the base of the skull through the vertex without and with intravenous contrast CONTRAST:  119mL ISOVUE-300 IOPAMIDOL (ISOVUE-300) INJECTION 61% COMPARISON:  10/20/2008 CT head. FINDINGS: Brain: No evidence  of acute infarction, hemorrhage, hydrocephalus, extra-axial collection or mass lesion/mass effect. No abnormal enhancement identified. There multiple foci of hypoattenuation throughout subcortical and periventricular white matter compatible with moderate chronic microvascular ischemic changes that are progressed from 2009. There is mild diffuse brain parenchymal volume loss. There are a few small foci within bilateral thalamus and caudate nuclei which may represent prominent perivascular spaces or chronic lacunar infarcts that are stable. Vascular: Calcific atherosclerosis of cavernous and paraclinoid internal carotid arteries. Normal enhancement of proximal circle of Willis and large dural venous sinuses. Skull: Normal. Negative for fracture or focal lesion. Sinuses/Orbits: No acute finding. Other: None. IMPRESSION: 1. No acute intracranial abnormality or abnormal enhancement of the brain  identified. 2. Mild progression in moderate chronic microvascular ischemic changes and mild brain parenchymal volume loss from 2009. Electronically Signed   By: Kristine Garbe M.D.   On: 01/02/2017 14:57   Ct Chest W Contrast  Result Date: 01/02/2017 CLINICAL DATA:  Ovarian cancer. EXAM: CT CHEST, ABDOMEN, AND PELVIS WITH CONTRAST TECHNIQUE: Multidetector CT imaging of the chest, abdomen and pelvis was performed following the standard protocol during bolus administration of intravenous contrast. CONTRAST:  142mL ISOVUE-300 IOPAMIDOL (ISOVUE-300) INJECTION 61% COMPARISON:  CT abdomen pelvis 07/04/2016 and CT chest 11/04/2015. FINDINGS: CT CHEST FINDINGS Cardiovascular: Right IJ Port-A-Cath terminates in the low SVC. Atherosclerotic calcification of the arterial vasculature, including coronary arteries. Heart is enlarged. No pericardial effusion. Mediastinum/Nodes: No pathologically enlarged mediastinal, hilar or axillary lymph nodes. Esophagus is grossly unremarkable. Lungs/Pleura: Biapical pleuroparenchymal scarring. Mild subpleural reticulation is seen without a definite zonal predominance and is likely unchanged from 11/04/2015. No pleural fluid. Airway is unremarkable. Musculoskeletal: No worrisome lytic or sclerotic lesions. CT ABDOMEN PELVIS FINDINGS Hepatobiliary: Liver is unremarkable. Faint stones are seen in the gallbladder. No biliary ductal dilatation. Pancreas: Negative. Spleen: Negative. Adrenals/Urinary Tract: Adrenal glands are unremarkable. Scarring in the lower pole right kidney. Subcentimeter low-attenuation lesions in the kidneys are too small to characterize. Ureters are decompressed. Bladder is grossly unremarkable. Stomach/Bowel: Stomach, small bowel, appendix and colon are unremarkable. Vascular/Lymphatic: Atherosclerotic calcification of the arterial vasculature without abdominal aortic aneurysm. Circumaortic left renal vein. No pathologically enlarged lymph nodes. Reproductive:  Hysterectomy and bilateral salpingo-oophorectomy. No adnexal mass. Other: Small bilateral inguinal hernias contain fat. No free fluid. Omentectomy. 7 mm nodule in the anterior left lower quadrant (series 2, image 80) may have been present slightly more posteriorly in the small bowel mesentery on the prior exam (series 2, image 53). Midline supraumbilical hernia contains fat. Musculoskeletal: No worrisome lytic or sclerotic lesions. Degenerative changes are seen in the spine. IMPRESSION: 1. No definitive evidence of metastatic disease. Mesenteric nodule in the anterior left lower quadrant may have been present on 07/04/2016. Continued attention on followup exams is warranted. 2. Aortic atherosclerosis (ICD10-170.0). Coronary artery calcification. 3. Mild subpleural reticulation in the lungs, likely stable and indicative of nonspecific interstitial pneumonitis. 4. Cholelithiasis. Electronically Signed   By: Lorin Picket M.D.   On: 01/02/2017 16:23   Ct Abdomen Pelvis W Contrast  Result Date: 01/02/2017 CLINICAL DATA:  Ovarian cancer. EXAM: CT CHEST, ABDOMEN, AND PELVIS WITH CONTRAST TECHNIQUE: Multidetector CT imaging of the chest, abdomen and pelvis was performed following the standard protocol during bolus administration of intravenous contrast. CONTRAST:  19mL ISOVUE-300 IOPAMIDOL (ISOVUE-300) INJECTION 61% COMPARISON:  CT abdomen pelvis 07/04/2016 and CT chest 11/04/2015. FINDINGS: CT CHEST FINDINGS Cardiovascular: Right IJ Port-A-Cath terminates in the low SVC. Atherosclerotic calcification of the arterial vasculature, including coronary arteries. Heart is  enlarged. No pericardial effusion. Mediastinum/Nodes: No pathologically enlarged mediastinal, hilar or axillary lymph nodes. Esophagus is grossly unremarkable. Lungs/Pleura: Biapical pleuroparenchymal scarring. Mild subpleural reticulation is seen without a definite zonal predominance and is likely unchanged from 11/04/2015. No pleural fluid. Airway is  unremarkable. Musculoskeletal: No worrisome lytic or sclerotic lesions. CT ABDOMEN PELVIS FINDINGS Hepatobiliary: Liver is unremarkable. Faint stones are seen in the gallbladder. No biliary ductal dilatation. Pancreas: Negative. Spleen: Negative. Adrenals/Urinary Tract: Adrenal glands are unremarkable. Scarring in the lower pole right kidney. Subcentimeter low-attenuation lesions in the kidneys are too small to characterize. Ureters are decompressed. Bladder is grossly unremarkable. Stomach/Bowel: Stomach, small bowel, appendix and colon are unremarkable. Vascular/Lymphatic: Atherosclerotic calcification of the arterial vasculature without abdominal aortic aneurysm. Circumaortic left renal vein. No pathologically enlarged lymph nodes. Reproductive: Hysterectomy and bilateral salpingo-oophorectomy. No adnexal mass. Other: Small bilateral inguinal hernias contain fat. No free fluid. Omentectomy. 7 mm nodule in the anterior left lower quadrant (series 2, image 80) may have been present slightly more posteriorly in the small bowel mesentery on the prior exam (series 2, image 53). Midline supraumbilical hernia contains fat. Musculoskeletal: No worrisome lytic or sclerotic lesions. Degenerative changes are seen in the spine. IMPRESSION: 1. No definitive evidence of metastatic disease. Mesenteric nodule in the anterior left lower quadrant may have been present on 07/04/2016. Continued attention on followup exams is warranted. 2. Aortic atherosclerosis (ICD10-170.0). Coronary artery calcification. 3. Mild subpleural reticulation in the lungs, likely stable and indicative of nonspecific interstitial pneumonitis. 4. Cholelithiasis. Electronically Signed   By: Lorin Picket M.D.   On: 01/02/2017 16:23    ASSESSMENT: Stage IIIc left ovarian cancer status post suboptimal debulking and chemotherapy.  PLAN:    1. Stage IIIc left ovarian cancer: Patient previously underwent suboptimal debulking as well as adjuvant  chemotherapy. She completed 5 cycles of carboplatinum and Abraxane on Apr 13, 2016.  Previously, after lengthy discussion with the patient, she declined any further treatment. Restaging CT scan completed on March 02, 2017 reviewed independently and reported as above with no evidence of recurrence. Her CA-125 is also within normal limits. No intervention is needed at this time. Return to clinic in 3 months with repeat laboratory work and further evaluation. Reimage in 6 months in August 2018. 2. Visual changes: Unclear etiology. CT head negative for metastatic disease.  Patient expressed understanding and was in agreement with this plan. She also understands that She can call clinic at any time with any questions, concerns, or complaints.     Lloyd Huger, MD   01/06/2017 8:16 AM

## 2017-01-03 ENCOUNTER — Inpatient Hospital Stay: Payer: Medicare Other

## 2017-01-03 ENCOUNTER — Inpatient Hospital Stay (HOSPITAL_BASED_OUTPATIENT_CLINIC_OR_DEPARTMENT_OTHER): Payer: Medicare Other | Admitting: Oncology

## 2017-01-03 VITALS — BP 133/84 | HR 81 | Temp 98.0°F | Ht 59.0 in | Wt 119.7 lb

## 2017-01-03 DIAGNOSIS — Z8543 Personal history of malignant neoplasm of ovary: Secondary | ICD-10-CM

## 2017-01-03 DIAGNOSIS — Z90722 Acquired absence of ovaries, bilateral: Secondary | ICD-10-CM

## 2017-01-03 DIAGNOSIS — K219 Gastro-esophageal reflux disease without esophagitis: Secondary | ICD-10-CM

## 2017-01-03 DIAGNOSIS — E7800 Pure hypercholesterolemia, unspecified: Secondary | ICD-10-CM

## 2017-01-03 DIAGNOSIS — I2510 Atherosclerotic heart disease of native coronary artery without angina pectoris: Secondary | ICD-10-CM | POA: Diagnosis not present

## 2017-01-03 DIAGNOSIS — Z800 Family history of malignant neoplasm of digestive organs: Secondary | ICD-10-CM

## 2017-01-03 DIAGNOSIS — H539 Unspecified visual disturbance: Secondary | ICD-10-CM

## 2017-01-03 DIAGNOSIS — K8020 Calculus of gallbladder without cholecystitis without obstruction: Secondary | ICD-10-CM

## 2017-01-03 DIAGNOSIS — Z79899 Other long term (current) drug therapy: Secondary | ICD-10-CM

## 2017-01-03 DIAGNOSIS — I10 Essential (primary) hypertension: Secondary | ICD-10-CM

## 2017-01-03 DIAGNOSIS — I700 Atherosclerosis of aorta: Secondary | ICD-10-CM | POA: Diagnosis not present

## 2017-01-03 DIAGNOSIS — Z90710 Acquired absence of both cervix and uterus: Secondary | ICD-10-CM

## 2017-01-03 DIAGNOSIS — C569 Malignant neoplasm of unspecified ovary: Secondary | ICD-10-CM

## 2017-01-03 DIAGNOSIS — F419 Anxiety disorder, unspecified: Secondary | ICD-10-CM

## 2017-01-03 DIAGNOSIS — R918 Other nonspecific abnormal finding of lung field: Secondary | ICD-10-CM

## 2017-01-03 NOTE — Progress Notes (Signed)
Patient here for follow up. She has experienced some vision changes this past month. Head and neck CT performed last week.

## 2017-01-07 ENCOUNTER — Ambulatory Visit: Payer: Medicare Other

## 2017-01-07 NOTE — Telephone Encounter (Signed)
Pt was seen in clinic on 01/03/17

## 2017-01-08 ENCOUNTER — Ambulatory Visit: Payer: Medicare Other

## 2017-01-10 ENCOUNTER — Other Ambulatory Visit: Payer: Medicare Other

## 2017-01-10 ENCOUNTER — Ambulatory Visit: Payer: Medicare Other | Admitting: Oncology

## 2017-02-08 ENCOUNTER — Inpatient Hospital Stay: Payer: Medicare Other | Attending: Oncology

## 2017-02-08 DIAGNOSIS — Z95828 Presence of other vascular implants and grafts: Secondary | ICD-10-CM

## 2017-02-08 DIAGNOSIS — Z8543 Personal history of malignant neoplasm of ovary: Secondary | ICD-10-CM | POA: Diagnosis not present

## 2017-02-08 DIAGNOSIS — C562 Malignant neoplasm of left ovary: Secondary | ICD-10-CM

## 2017-02-08 MED ORDER — SODIUM CHLORIDE 0.9% FLUSH
10.0000 mL | INTRAVENOUS | Status: DC | PRN
  Administered 2017-02-08: 10 mL via INTRAVENOUS
  Filled 2017-02-08: qty 10

## 2017-02-08 MED ORDER — HEPARIN SOD (PORK) LOCK FLUSH 100 UNIT/ML IV SOLN
500.0000 [IU] | Freq: Once | INTRAVENOUS | Status: AC
  Administered 2017-02-08: 500 [IU] via INTRAVENOUS

## 2017-02-09 LAB — CA 125: CA 125: 138 U/mL — ABNORMAL HIGH (ref 0.0–38.1)

## 2017-02-13 ENCOUNTER — Telehealth: Payer: Self-pay

## 2017-02-13 DIAGNOSIS — C569 Malignant neoplasm of unspecified ovary: Secondary | ICD-10-CM

## 2017-02-13 NOTE — Telephone Encounter (Addendum)
  Oncology Nurse Navigator Documentation Notified son, [name], of rising CA125. He is aware of appointment 4/4 with Dr Fransisca Connors at 0900. He would like lab repeated as he was unaware that she had labs drawn while she was here for port flush. Spoke with Dr. Fransisca Connors and he is okay with repeat due to marked rise in one month.  CA 125 0.0 - 38.1 U/mL 138.0     Navigator Location: [facility] (02/13/17 1600)   )Navigator Encounter Type: Diagnostic Results (02/13/17 1600) Telephone: Outgoing Call;Diagnostic Results (02/13/17 1600)                                                  Time Spent with Patient: 15 (02/13/17 1600)

## 2017-02-13 NOTE — Telephone Encounter (Signed)
  Oncology Nurse Navigator Documentation Voicemail left with Ms. Bonnie regarding need to discuss recent lab results. CA-125 is rising. Dr. Fransisca Connors would like to see in clinic 4/4. Navigator Location: [facility] (02/13/17 1500)   )Navigator Encounter Type: Diagnostic Results;Telephone (02/13/17 1500) Telephone: Outgoing Call;Diagnostic Results (02/13/17 1500)                                                  Time Spent with Patient: 15 (02/13/17 1500)

## 2017-02-15 ENCOUNTER — Inpatient Hospital Stay: Payer: Medicare Other

## 2017-02-15 DIAGNOSIS — C569 Malignant neoplasm of unspecified ovary: Secondary | ICD-10-CM

## 2017-02-15 DIAGNOSIS — Z8543 Personal history of malignant neoplasm of ovary: Secondary | ICD-10-CM | POA: Diagnosis not present

## 2017-02-15 LAB — CBC WITH DIFFERENTIAL/PLATELET
BASOS ABS: 0.1 10*3/uL (ref 0–0.1)
Basophils Relative: 1 %
EOS ABS: 0.1 10*3/uL (ref 0–0.7)
Eosinophils Relative: 1 %
HCT: 35.4 % (ref 35.0–47.0)
HEMOGLOBIN: 11.9 g/dL — AB (ref 12.0–16.0)
LYMPHS ABS: 3.1 10*3/uL (ref 1.0–3.6)
Lymphocytes Relative: 28 %
MCH: 29.6 pg (ref 26.0–34.0)
MCHC: 33.5 g/dL (ref 32.0–36.0)
MCV: 88.5 fL (ref 80.0–100.0)
Monocytes Absolute: 1 10*3/uL — ABNORMAL HIGH (ref 0.2–0.9)
Monocytes Relative: 9 %
NEUTROS PCT: 61 %
Neutro Abs: 6.9 10*3/uL — ABNORMAL HIGH (ref 1.4–6.5)
Platelets: 329 10*3/uL (ref 150–440)
RBC: 4.01 MIL/uL (ref 3.80–5.20)
RDW: 14.7 % — ABNORMAL HIGH (ref 11.5–14.5)
WBC: 11.1 10*3/uL — AB (ref 3.6–11.0)

## 2017-02-15 LAB — COMPREHENSIVE METABOLIC PANEL
ALK PHOS: 90 U/L (ref 38–126)
ALT: 46 U/L (ref 14–54)
AST: 58 U/L — AB (ref 15–41)
Albumin: 4 g/dL (ref 3.5–5.0)
Anion gap: 7 (ref 5–15)
BUN: 17 mg/dL (ref 6–20)
CALCIUM: 9.3 mg/dL (ref 8.9–10.3)
CO2: 28 mmol/L (ref 22–32)
CREATININE: 0.88 mg/dL (ref 0.44–1.00)
Chloride: 99 mmol/L — ABNORMAL LOW (ref 101–111)
GFR calc Af Amer: >60 mL/min (ref >60)
GFR calc non Af Amer: 58 mL/min — ABNORMAL LOW (ref >60)
GLUCOSE: 95 mg/dL (ref 65–99)
Potassium: 4.5 mmol/L (ref 3.5–5.1)
SODIUM: 134 mmol/L — AB (ref 135–145)
Total Bilirubin: 0.8 mg/dL (ref 0.3–1.2)
Total Protein: 7.9 g/dL (ref 6.5–8.1)

## 2017-02-16 LAB — CA 125: CA 125: 176.4 U/mL — ABNORMAL HIGH (ref 0.0–38.1)

## 2017-02-20 ENCOUNTER — Inpatient Hospital Stay: Payer: Medicare Other | Attending: Obstetrics and Gynecology | Admitting: Obstetrics and Gynecology

## 2017-02-20 VITALS — BP 126/77 | HR 93 | Temp 97.0°F | Wt 120.6 lb

## 2017-02-20 DIAGNOSIS — Z79899 Other long term (current) drug therapy: Secondary | ICD-10-CM | POA: Insufficient documentation

## 2017-02-20 DIAGNOSIS — C569 Malignant neoplasm of unspecified ovary: Secondary | ICD-10-CM | POA: Insufficient documentation

## 2017-02-20 DIAGNOSIS — K219 Gastro-esophageal reflux disease without esophagitis: Secondary | ICD-10-CM | POA: Insufficient documentation

## 2017-02-20 DIAGNOSIS — I10 Essential (primary) hypertension: Secondary | ICD-10-CM | POA: Insufficient documentation

## 2017-02-20 DIAGNOSIS — Z90710 Acquired absence of both cervix and uterus: Secondary | ICD-10-CM | POA: Insufficient documentation

## 2017-02-20 DIAGNOSIS — H409 Unspecified glaucoma: Secondary | ICD-10-CM | POA: Insufficient documentation

## 2017-02-20 DIAGNOSIS — E7800 Pure hypercholesterolemia, unspecified: Secondary | ICD-10-CM | POA: Diagnosis not present

## 2017-02-20 DIAGNOSIS — C8202 Follicular lymphoma grade I, intrathoracic lymph nodes: Secondary | ICD-10-CM | POA: Diagnosis not present

## 2017-02-20 DIAGNOSIS — I700 Atherosclerosis of aorta: Secondary | ICD-10-CM | POA: Diagnosis not present

## 2017-02-20 DIAGNOSIS — Z9221 Personal history of antineoplastic chemotherapy: Secondary | ICD-10-CM | POA: Insufficient documentation

## 2017-02-20 DIAGNOSIS — I2510 Atherosclerotic heart disease of native coronary artery without angina pectoris: Secondary | ICD-10-CM | POA: Diagnosis not present

## 2017-02-20 DIAGNOSIS — K8020 Calculus of gallbladder without cholecystitis without obstruction: Secondary | ICD-10-CM | POA: Insufficient documentation

## 2017-02-20 DIAGNOSIS — K0820 Unspecified atrophy of edentulous alveolar ridge: Secondary | ICD-10-CM | POA: Diagnosis not present

## 2017-02-20 DIAGNOSIS — Z90722 Acquired absence of ovaries, bilateral: Secondary | ICD-10-CM | POA: Insufficient documentation

## 2017-02-20 NOTE — Progress Notes (Signed)
Patient here for follow up. Still experiencing memory and vision loss.

## 2017-02-20 NOTE — Progress Notes (Signed)
  Oncology Nurse Navigator Documentation Chaperoned pelvic exam. Navigator Location: [facility] (02/20/17 0900)   )Navigator Encounter Type: Follow-up Appt (02/20/17 0900) Telephone: Diagnostic Results (02/20/17 0900)                   Patient Visit Type: GynOnc (02/20/17 0900)                              Time Spent with Patient: 15 (02/20/17 0900)

## 2017-02-20 NOTE — Progress Notes (Signed)
Gynecologic Oncology Interval Visit   Referring Provider: Dr. Hall Busing  Chief Concern: stage IIIC ovarian cancer, history of rectovaginal fistula s/p rectosigmoid resection and reanastomosis with optimal interval tumor debulking and chemotherapy.   Subjective:  MAKAIAH TERWILLIGER is a 83 y.o. female who is seen initially in consultation from Dr. Hall Busing with stage IIIC HGSC of the ovary.   Interval history Ms. Morand has done since last visit. She completed 5 cycles of carboplatinum and Abraxane on Apr 13, 2016 with normal CA125.  She declined further chemotherapy. She saw Dr. Grayland Ormond on 06/03/2016 and CT imaging and CA125 in August was normal.  She presents today for follow up.  She saw Dr Grayland Ormond in 2/18 and had some vision complaints and memory loss.  Head CT was normal as was chest/abdomen and pelvis CT.  CA125 was normal too (21.6)  No significant new complaints today.  She lives alone and is planning a trip to [place] soon.  Accompanied by her son today.    CT scan abd/pelvis 2/18 IMPRESSION: 1. No definitive evidence of metastatic disease. Mesenteric nodule in the anterior left lower quadrant may have been present on 07/04/2016. Continued attention on followup exams is warranted. 2. Aortic atherosclerosis (ICD10-170.0). Coronary artery calcification. 3. Mild subpleural reticulation in the lungs, likely stable and indicative of nonspecific interstitial pneumonitis. 4. Cholelithiasis.  Lab Results  Component Value Date   CA125 176.4 (H) 02/15/2017   CA125 138.0 (H) 02/08/2017   CA125 21.6 12/28/2016    Gynecologic Oncology History   HAYLE PARISI is a pleasant female seen initially in consultation from Dr. Hall Busing with suboptimally debulked stage IIIC HGSC. She underwent laparotomy and LSO with resection of pelvic mass on 07/20/2015 and has stage IIIC ovarian cancer. Her disease was not amendable to debulking to no gross residual. She would have required several bowel resections, diaphragm  stripping, and liver mobilization and due to peritoneal carcinomatosis would not have been debulked to R0. Preoperative CA125 74.2 and HE4=118  FINAL PATHOLOGY DIAGNOSIS:  A. LEFT OVARY; OOPHORECTOMY:  - HIGH-GRADE SEROUS CARCINOMA.  - FALLOPIAN TUBE NOT IDENTIFIED.  She has been treated with weekly taxane and carboplatin (initially paclitaxel but was switched to Abraxane on day 1 and 8 and 15). Bevacizumab was added cycles 2 and 3. She is tolerating her therapy with an excellent decrease in CA125 values. 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 She  status for status post XL on 01/11/2016 with RSO, enterolysis, infragastric omentectomy, mobilization of splenic flexure,  resection of rectosigmoid and repair of fistula, with optimal debulking to < 3 mm. Residual disease is miliary in nature involving small bowel mesentary. Pathology results c/w high grade serous adenocarcinoma except rectosigmoid colon, excision which revealed benign colonic tissue with diverticula.  She completed 5 cycles of carboplatin and Abraxane on Apr 13, 2016 with normal CA125.  She declined further chemotherapy. She saw Dr. Grayland Ormond on 06/03/2016 and CT imaging in August was normal.    In June 2017 she was admitted with UTI, C. Diff colitis and dehydration.    Genetic testing BRCA mutation was negative  Problem List: Patient Active Problem List   Diagnosis Date Noted  . Renal failure 04/17/2016  . Severe protein-calorie malnutrition Altamease Oiler: less than 60% of standard weight) ([place]) 11/07/2015  . Essential (primary) hypertension 11/05/2015  . Colovaginal fistula 11/04/2015  . Acid reflux 08/03/2015  . Bilateral cataracts 08/03/2015  . Chicken pox 08/03/2015  . Allergic state 08/03/2015  . Breast lump  08/03/2015  . Hypercholesteremia 08/03/2015  . Malignant melanoma ([place]) 08/03/2015  . Ovarian cancer ([place]) 07/20/2015  . Head or neck swelling, mass, or lump 08/17/2014    Past Medical  History: Past Medical History:  Diagnosis Date  . Anxiety   . Cancer ([place])   . GERD (gastroesophageal reflux disease)   . Glaucoma   . Hypercholesteremia   . Hypertension   . Ovarian cancer ([place]) 06/2015   Bilateral Oophorectomies.   Marland Kitchen PONV (postoperative nausea and vomiting)     Past Surgical History: Past Surgical History:  Procedure Laterality Date  . ABDOMINAL HYSTERECTOMY    . BREAST BIOPSY Right 1970's  . EXPLORATORY LAPAROTOMY W/ BOWEL RESECTION  01/11/2016   XL optimal debulking rectosigmoid resection and reanastomosis  . HAND SURGERY     Right  . LAPAROTOMY Left 07/20/2015   Procedure: LAPAROTOMY with Left Salpingooophrectomy;  Surgeon: Honor Loh Ward, MD;  Location: [facility];  Service: Gynecology;  Laterality: Left;  . LAPAROTOMY N/A 07/20/2015   Procedure: LAPAROTOMY;  Surgeon: Gillis Ends, MD;  Location: [facility];  Service: Gynecology;  Laterality: N/A;  . PERIPHERAL VASCULAR CATHETERIZATION N/A 08/10/2015   Procedure: Glori Luis Cath Insertion;  Surgeon: Algernon Huxley, MD;  Location: [facility];  Service: Cardiovascular;  Laterality: N/A;  . REPLACEMENT TOTAL KNEE     Right  . SALPINGOOPHORECTOMY Right 07/20/2015   Procedure: SALPINGO OOPHORECTOMY;  Surgeon: Gillis Ends, MD;  Location: [facility];  Service: Gynecology;  Laterality: Right;    Past Gynecologic History:  Menopause: 1976 06/07/2014 Mammogram IMPRESSION: No mammographic evidence of malignancy. A result letter of this screening mammogram will be mailed directly to the patient.   OB History:  OB History  Gravida Para Term Preterm AB Living  3 1     2     SAB TAB Ectopic Multiple Live Births  2            # Outcome Date GA Lbr Len/2nd Weight Sex Delivery Anes PTL Lv  3 Para           2 SAB           1 SAB               Family History: Family History  Problem Relation Age of Onset  . Stomach cancer Mother   . Osteoporosis Mother   . Breast cancer Neg Hx     Social  History: Social History   Social History  . Marital status: Widowed    Spouse name: N/A  . Number of children: N/A  . Years of education: N/A   Occupational History  .      Retired   Social History Main Topics  . Smoking status: Never Smoker  . Smokeless tobacco: Never Used  . Alcohol use No  . Drug use: No  . Sexual activity: Not on file   Other Topics Concern  . Not on file   Social History Narrative  . No narrative on file    Allergies: Allergies  Allergen Reactions  . Clindamycin/Lincomycin Diarrhea and Other (See Comments)    Reaction:  Antibiotic colitis   . Codeine Anxiety    Current Medications: Current Outpatient Prescriptions  Medication Sig Dispense Refill  . acetaminophen (TYLENOL) 500 MG tablet Take 500-1,000 mg by mouth every 6 (six) hours as needed for mild pain, fever or headache.     . bimatoprost (LUMIGAN) 0.01 % SOLN Place 1 drop into both eyes at  bedtime.    . fluticasone (FLONASE) 50 MCG/ACT nasal spray Place 2 sprays into both nostrils daily as needed for rhinitis.     Marland Kitchen lidocaine-prilocaine (EMLA) cream Apply 1 application topically as needed (prior to accessing port).    . LORazepam (ATIVAN) 0.5 MG tablet Take 0.5 mg by mouth 2 (two) times daily.     . meloxicam (MOBIC) 15 MG tablet Take 15 mg by mouth daily.     Marland Kitchen omeprazole (PRILOSEC) 20 MG capsule Take 20 mg by mouth daily.     . ondansetron (ZOFRAN) 4 MG tablet Take 1 tablet (4 mg total) by mouth every 6 (six) hours as needed for nausea. (Patient not taking: Reported on 01/03/2017) 30 tablet 3  . simvastatin (ZOCOR) 20 MG tablet Take 20 mg by mouth at bedtime.      No current facility-administered medications for this visit.    Facility-Administered Medications Ordered in Other Visits  Medication Dose Route Frequency Provider Last Rate Last Dose  . diphenhydrAMINE (BENADRYL) injection 12.5 mg  12.5 mg Intravenous Once Forest Gleason, MD      . methylPREDNISolone sodium succinate  (SOLU-MEDROL) 125 mg/2 mL injection 100 mg  100 mg Intravenous Once Forest Gleason, MD        Review of Systems General: negative  HEENT: no complaints  Lungs: no complaints  Cardiac: no complaints  GI: no complaints  GU: no complaints  Musculoskeletal: no complaints  Extremities: no complaints  Skin: skin issues  Neuro: no complaints  Endocrine: no complaints  Psych: no complaints      Objective:  Physical Examination:  BP 126/77 (BP Location: Left Arm, Patient Position: Sitting)   Pulse 93   Temp 97 F (36.1 C) (Tympanic)   Wt 120 lb 9.6 oz (54.7 kg)   BMI 24.36 kg/m   Body mass index is 24.36 kg/m.   ECOG Performance Status: 1-2  General appearance: alert, cooperative and appears stated age HEENT:PERRLA, extra ocular movement intact and sclera clear, anicteric No carotid bruits.  Abdomen: soft, nontender and nondistended. No palpable masses or ascites, well healed incision.  There is a small hernia in the midline upper abdomen.   Neurological exam reveals alert, oriented, normal speech, no focal findings or movement disorder noted. Extremities: Negative for edema or cords Neuro: Grossly intact  Pelvic: exam chaperoned by nurse;  Vulva: normal appearing vulva with no masses, tenderness or lesions; Vagina: normal vagina with healing at the cuff; Adnexa: surgically absent; Uterus: surgically absent, vaginal cuff well healed; Cervix: absent; Rectal: not performed.    Assessment:  BRITTANEE GHAZARIAN is a 83 y.o. female diagnosed with stage IIIc high grade serous ovarian cancer s/p suboptimal debulking, chemotherapy with weekly abraxane, carboplatin, and bevacizumab, bowel perforation/RV fistula,  interval optimally debulking with rectosigmoid resection and reanastomosis for rectovaginal fistula and resumption of chemotherapy without bevacizumab. NED after 5 cycles of chemotherapy, but CA125 rising now and this likely represents some regrowth of cancer despite normal CT scan in  [place].  Medical co-morbidities complicating care: prior abdominal surgery, frailty, diverticular disease. History of UTI. History of C.Diff.     Plan:   Problem List Items Addressed This Visit    None    Visit Diagnoses    Malignant neoplasm of ovary, unspecified laterality ([place])    -  Primary     We discussed with the patient and her son that the rising CA125 likely is due to regrowth of ovarian cancer.  In view of her advanced age  and because she is asymptomatic, we recommend watchful waiting as opposed to restarting chemotherapy now. They are in agreement with this plan. She will follow up in 2 months with CA125.  She can return sooner as needed.   We also discussed her neurological symptoms and suggested that she follow up with her internist, since head CT just showed moderate ischemic changes.   Mellody Drown, MD  CC:  Larey Days, MD  Albina Billet, MD [address]   [place], [place] [address] [phone]

## 2017-03-22 ENCOUNTER — Inpatient Hospital Stay: Payer: Medicare Other

## 2017-04-02 ENCOUNTER — Inpatient Hospital Stay: Payer: Medicare Other | Attending: Oncology

## 2017-04-02 DIAGNOSIS — Z8543 Personal history of malignant neoplasm of ovary: Secondary | ICD-10-CM | POA: Insufficient documentation

## 2017-04-02 DIAGNOSIS — Z95828 Presence of other vascular implants and grafts: Secondary | ICD-10-CM

## 2017-04-02 DIAGNOSIS — Z452 Encounter for adjustment and management of vascular access device: Secondary | ICD-10-CM | POA: Insufficient documentation

## 2017-04-02 DIAGNOSIS — C642 Malignant neoplasm of left kidney, except renal pelvis: Secondary | ICD-10-CM | POA: Diagnosis not present

## 2017-04-02 DIAGNOSIS — C569 Malignant neoplasm of unspecified ovary: Secondary | ICD-10-CM

## 2017-04-02 LAB — COMPREHENSIVE METABOLIC PANEL
ALBUMIN: 3.9 g/dL (ref 3.5–5.0)
ALK PHOS: 84 U/L (ref 38–126)
ALT: 21 U/L (ref 14–54)
AST: 35 U/L (ref 15–41)
Anion gap: 6 (ref 5–15)
BILIRUBIN TOTAL: 0.7 mg/dL (ref 0.3–1.2)
BUN: 20 mg/dL (ref 6–20)
CALCIUM: 9.1 mg/dL (ref 8.9–10.3)
CO2: 27 mmol/L (ref 22–32)
Chloride: 102 mmol/L (ref 101–111)
Creatinine, Ser: 0.94 mg/dL (ref 0.44–1.00)
GFR calc Af Amer: >60 mL/min (ref >60)
GFR, EST NON AFRICAN AMERICAN: 53 mL/min — AB (ref >60)
GLUCOSE: 135 mg/dL — AB (ref 65–99)
POTASSIUM: 3.8 mmol/L (ref 3.5–5.1)
Sodium: 135 mmol/L (ref 135–145)
TOTAL PROTEIN: 7.5 g/dL (ref 6.5–8.1)

## 2017-04-02 LAB — CBC WITH DIFFERENTIAL/PLATELET
BASOS ABS: 0.1 10*3/uL (ref 0–0.1)
Basophils Relative: 1 %
Eosinophils Absolute: 0.1 10*3/uL (ref 0–0.7)
Eosinophils Relative: 1 %
HCT: 34 % — ABNORMAL LOW (ref 35.0–47.0)
HEMOGLOBIN: 11.3 g/dL — AB (ref 12.0–16.0)
LYMPHS PCT: 34 %
Lymphs Abs: 2.9 10*3/uL (ref 1.0–3.6)
MCH: 28.9 pg (ref 26.0–34.0)
MCHC: 33.2 g/dL (ref 32.0–36.0)
MCV: 87 fL (ref 80.0–100.0)
MONO ABS: 0.9 10*3/uL (ref 0.2–0.9)
MONOS PCT: 11 %
NEUTROS ABS: 4.7 10*3/uL (ref 1.4–6.5)
NEUTROS PCT: 53 %
Platelets: 285 10*3/uL (ref 150–440)
RBC: 3.91 MIL/uL (ref 3.80–5.20)
RDW: 13.7 % (ref 11.5–14.5)
WBC: 8.6 10*3/uL (ref 3.6–11.0)

## 2017-04-02 MED ORDER — SODIUM CHLORIDE 0.9% FLUSH
10.0000 mL | INTRAVENOUS | Status: DC | PRN
  Administered 2017-04-02: 10 mL via INTRAVENOUS
  Filled 2017-04-02: qty 10

## 2017-04-02 MED ORDER — HEPARIN SOD (PORK) LOCK FLUSH 100 UNIT/ML IV SOLN
500.0000 [IU] | Freq: Once | INTRAVENOUS | Status: AC
  Administered 2017-04-02: 500 [IU] via INTRAVENOUS

## 2017-04-02 NOTE — Progress Notes (Signed)
[facility]  [phone] [phone]  ID: Kathryn Chandler OB: 05/12/1929  MR#: [id number]  VZC#:[id number]  Patient Care Team: Albina Billet, MD as PCP - General (Internal Medicine) Gillis Ends, MD as Referring Physician (Obstetrics and Gynecology) Ward, Honor Loh, MD as Referring Physician (Obstetrics and Gynecology) Lloyd Huger, MD as Consulting Physician (Oncology)  CHIEF COMPLAINT: Stage IIIc left ovarian cancer status post suboptimal debulking and chemotherapy.  INTERVAL HISTORY: Patient returns to clinic today for further evaluation and laboratory work. She currently feels well and is asymptomatic. She does not complain of weakness or fatigue today. She has no neurologic complaints. She denies any fevers. She denies any chest pain, shortness of breath, cough, or hemoptysis. She has a fair appetite, but denies weight loss. She has no nausea, vomiting, constipation, or diarrhea. She denies any abdominal bloating.  She has no urinary complaints. Patient offers no specific complaints today.  REVIEW OF SYSTEMS:   Review of Systems  Constitutional: Negative for fever, malaise/fatigue and weight loss.  Eyes: Negative for blurred vision.  Respiratory: Negative.  Negative for cough and shortness of breath.   Cardiovascular: Negative.  Negative for chest pain and leg swelling.  Gastrointestinal: Negative.  Negative for abdominal pain, blood in stool, constipation, diarrhea, melena, nausea and vomiting.  Genitourinary: Negative.   Musculoskeletal: Negative.   Neurological: Negative.  Negative for weakness.  Psychiatric/Behavioral: Negative.  The patient is not nervous/anxious.     As per HPI. Otherwise, a complete review of systems is negative.  PAST MEDICAL HISTORY: Past Medical History:  Diagnosis Date  . Anxiety   . Cancer ([place])   . GERD (gastroesophageal reflux disease)   . Glaucoma   . Hypercholesteremia   . Hypertension    . Ovarian cancer ([place]) 06/2015   Bilateral Oophorectomies.   Marland Kitchen PONV (postoperative nausea and vomiting)     PAST SURGICAL HISTORY: Past Surgical History:  Procedure Laterality Date  . ABDOMINAL HYSTERECTOMY    . BREAST BIOPSY Right 1970's  . EXPLORATORY LAPAROTOMY W/ BOWEL RESECTION  01/11/2016   XL optimal debulking rectosigmoid resection and reanastomosis  . HAND SURGERY     Right  . LAPAROTOMY Left 07/20/2015   Procedure: LAPAROTOMY with Left Salpingooophrectomy;  Surgeon: Honor Loh Ward, MD;  Location: [facility];  Service: Gynecology;  Laterality: Left;  . LAPAROTOMY N/A 07/20/2015   Procedure: LAPAROTOMY;  Surgeon: Gillis Ends, MD;  Location: [facility];  Service: Gynecology;  Laterality: N/A;  . PERIPHERAL VASCULAR CATHETERIZATION N/A 08/10/2015   Procedure: Glori Luis Cath Insertion;  Surgeon: Algernon Huxley, MD;  Location: [facility];  Service: Cardiovascular;  Laterality: N/A;  . REPLACEMENT TOTAL KNEE     Right  . SALPINGOOPHORECTOMY Right 07/20/2015   Procedure: SALPINGO OOPHORECTOMY;  Surgeon: Gillis Ends, MD;  Location: [facility];  Service: Gynecology;  Laterality: Right;    FAMILY HISTORY: Family History  Problem Relation Age of Onset  . Stomach cancer Mother   . Osteoporosis Mother   . Breast cancer Neg Hx        ADVANCED DIRECTIVES:    HEALTH MAINTENANCE: Social History  Substance Use Topics  . Smoking status: Never Smoker  . Smokeless tobacco: Never Used  . Alcohol use No     Colonoscopy:  PAP:  Bone density:  Lipid panel:  Allergies  Allergen Reactions  . Clindamycin/Lincomycin Diarrhea and Other (See Comments)    Reaction:  Antibiotic colitis   . Codeine Anxiety  Current Outpatient Prescriptions  Medication Sig Dispense Refill  . acetaminophen (TYLENOL) 500 MG tablet Take 500-1,000 mg by mouth every 6 (six) hours as needed for mild pain, fever or headache.     . bimatoprost (LUMIGAN) 0.01 % SOLN Place 1 drop into both  eyes at bedtime.    . fluticasone (FLONASE) 50 MCG/ACT nasal spray Place 2 sprays into both nostrils daily as needed for rhinitis.     Marland Kitchen lidocaine-prilocaine (EMLA) cream Apply 1 application topically as needed (prior to accessing port).    . LORazepam (ATIVAN) 0.5 MG tablet Take 0.5 mg by mouth 2 (two) times daily.     . meloxicam (MOBIC) 15 MG tablet Take 15 mg by mouth daily.     Marland Kitchen omeprazole (PRILOSEC) 20 MG capsule Take 20 mg by mouth daily.     . ondansetron (ZOFRAN) 4 MG tablet Take 1 tablet (4 mg total) by mouth every 6 (six) hours as needed for nausea. 30 tablet 3  . simvastatin (ZOCOR) 20 MG tablet Take 20 mg by mouth at bedtime.      No current facility-administered medications for this visit.    Facility-Administered Medications Ordered in Other Visits  Medication Dose Route Frequency Provider Last Rate Last Dose  . diphenhydrAMINE (BENADRYL) injection 12.5 mg  12.5 mg Intravenous Once Choksi, Delorise Shiner, MD      . methylPREDNISolone sodium succinate (SOLU-MEDROL) 125 mg/2 mL injection 100 mg  100 mg Intravenous Once Choksi, Delorise Shiner, MD        OBJECTIVE: Vitals:   04/04/17 1518  BP: (!) 167/96  Pulse: 87  Resp: 20  Temp: (!) 96.8 F (36 C)     Body mass index is 24.84 kg/m.    ECOG FS:1 - Symptomatic but completely ambulatory  General: Thin, no acute distress. Eyes: Pink conjunctiva, anicteric sclera. Lungs: Clear to auscultation bilaterally. Heart: Regular rate and rhythm. No rubs, murmurs, or gallops. Abdomen: Soft, nontender, nondistended. No organomegaly noted, normoactive bowel sounds. Musculoskeletal: No edema, cyanosis, or clubbing. Neuro: Alert, answering all questions appropriately. Cranial nerves grossly intact. Skin: No rashes or petechiae noted. Psych: Normal affect.   LAB RESULTS:  Lab Results  Component Value Date   NA 135 04/02/2017   K 3.8 04/02/2017   CL 102 04/02/2017   CO2 27 04/02/2017   GLUCOSE 135 (H) 04/02/2017   BUN 20 04/02/2017    CREATININE 0.94 04/02/2017   CALCIUM 9.1 04/02/2017   PROT 7.5 04/02/2017   ALBUMIN 3.9 04/02/2017   AST 35 04/02/2017   ALT 21 04/02/2017   ALKPHOS 84 04/02/2017   BILITOT 0.7 04/02/2017   GFRNONAA 53 (L) 04/02/2017   GFRAA >60 04/02/2017    Lab Results  Component Value Date   WBC 8.6 04/02/2017   NEUTROABS 4.7 04/02/2017   HGB 11.3 (L) 04/02/2017   HCT 34.0 (L) 04/02/2017   MCV 87.0 04/02/2017   PLT 285 04/02/2017    Lab Results  Component Value Date   CA125 171.8 (H) 04/02/2017    STUDIES: No results found.  ASSESSMENT: Stage IIIc left ovarian cancer status post suboptimal debulking and chemotherapy.  PLAN:    1. Stage IIIc left ovarian cancer: Patient previously underwent suboptimal debulking as well as adjuvant chemotherapy. She completed 5 cycles of carboplatinum and Abraxane on Apr 13, 2016.  Previously, after lengthy discussion with the patient, she declined any further treatment. Restaging CT scan completed on March 02, 2017 reviewed independently no evidence of recurrence. Her CA-125 has trended up significantly.  After lengthy discussion with the patient and her son, because she is asymptomatic she does not wish to pursue any additional treatment at this time. She did agree to return to clinic in 6 weeks for laboratory work only and then in 3 months for laboratory work, repeat imaging, and further evaluation.  2. Hypertension: Patient's blood pressure is significantly elevated. Continue monitoring and treatment per primary care.  Approximately 30 minutes was spent in discussion of which greater than 50% was consultation.  Patient expressed understanding and was in agreement with this plan. She also understands that She can call clinic at any time with any questions, concerns, or complaints.     Lloyd Huger, MD   04/07/2017 10:04 PM

## 2017-04-03 LAB — CA 125: CA 125: 171.8 U/mL — ABNORMAL HIGH (ref 0.0–38.1)

## 2017-04-04 ENCOUNTER — Inpatient Hospital Stay: Payer: Medicare Other | Attending: Oncology | Admitting: Oncology

## 2017-04-04 VITALS — BP 167/96 | HR 87 | Temp 96.8°F | Resp 20 | Wt 123.0 lb

## 2017-04-04 DIAGNOSIS — Z79899 Other long term (current) drug therapy: Secondary | ICD-10-CM | POA: Diagnosis not present

## 2017-04-04 DIAGNOSIS — C562 Malignant neoplasm of left ovary: Secondary | ICD-10-CM

## 2017-04-04 DIAGNOSIS — I10 Essential (primary) hypertension: Secondary | ICD-10-CM | POA: Insufficient documentation

## 2017-04-04 DIAGNOSIS — Z8543 Personal history of malignant neoplasm of ovary: Secondary | ICD-10-CM | POA: Insufficient documentation

## 2017-04-04 DIAGNOSIS — Z803 Family history of malignant neoplasm of breast: Secondary | ICD-10-CM

## 2017-04-04 DIAGNOSIS — E7800 Pure hypercholesterolemia, unspecified: Secondary | ICD-10-CM | POA: Insufficient documentation

## 2017-04-04 DIAGNOSIS — Z90722 Acquired absence of ovaries, bilateral: Secondary | ICD-10-CM | POA: Diagnosis not present

## 2017-04-04 DIAGNOSIS — F419 Anxiety disorder, unspecified: Secondary | ICD-10-CM | POA: Insufficient documentation

## 2017-04-04 DIAGNOSIS — Z9221 Personal history of antineoplastic chemotherapy: Secondary | ICD-10-CM | POA: Insufficient documentation

## 2017-04-04 DIAGNOSIS — K219 Gastro-esophageal reflux disease without esophagitis: Secondary | ICD-10-CM | POA: Diagnosis not present

## 2017-04-04 NOTE — Progress Notes (Signed)
Patient denies any concerns today.  

## 2017-04-17 ENCOUNTER — Inpatient Hospital Stay: Payer: Medicare Other

## 2017-04-17 DIAGNOSIS — C562 Malignant neoplasm of left ovary: Secondary | ICD-10-CM

## 2017-04-17 DIAGNOSIS — Z8543 Personal history of malignant neoplasm of ovary: Secondary | ICD-10-CM | POA: Diagnosis not present

## 2017-04-17 LAB — CBC WITH DIFFERENTIAL/PLATELET
BASOS ABS: 0.1 10*3/uL (ref 0–0.1)
BASOS PCT: 1 %
EOS PCT: 1 %
Eosinophils Absolute: 0.1 10*3/uL (ref 0–0.7)
HEMATOCRIT: 36.1 % (ref 35.0–47.0)
Hemoglobin: 12.1 g/dL (ref 12.0–16.0)
Lymphocytes Relative: 31 %
Lymphs Abs: 3.1 10*3/uL (ref 1.0–3.6)
MCH: 29.2 pg (ref 26.0–34.0)
MCHC: 33.6 g/dL (ref 32.0–36.0)
MCV: 86.8 fL (ref 80.0–100.0)
MONO ABS: 1 10*3/uL — AB (ref 0.2–0.9)
Monocytes Relative: 10 %
NEUTROS ABS: 5.8 10*3/uL (ref 1.4–6.5)
Neutrophils Relative %: 57 %
PLATELETS: 299 10*3/uL (ref 150–440)
RBC: 4.15 MIL/uL (ref 3.80–5.20)
RDW: 13.4 % (ref 11.5–14.5)
WBC: 10 10*3/uL (ref 3.6–11.0)

## 2017-04-17 LAB — COMPREHENSIVE METABOLIC PANEL
ALBUMIN: 4.1 g/dL (ref 3.5–5.0)
ALT: 20 U/L (ref 14–54)
ANION GAP: 8 (ref 5–15)
AST: 33 U/L (ref 15–41)
Alkaline Phosphatase: 91 U/L (ref 38–126)
BILIRUBIN TOTAL: 0.9 mg/dL (ref 0.3–1.2)
BUN: 17 mg/dL (ref 6–20)
CO2: 28 mmol/L (ref 22–32)
Calcium: 9.5 mg/dL (ref 8.9–10.3)
Chloride: 99 mmol/L — ABNORMAL LOW (ref 101–111)
Creatinine, Ser: 1.04 mg/dL — ABNORMAL HIGH (ref 0.44–1.00)
GFR calc non Af Amer: 47 mL/min — ABNORMAL LOW (ref >60)
GFR, EST AFRICAN AMERICAN: 55 mL/min — AB (ref >60)
GLUCOSE: 116 mg/dL — AB (ref 65–99)
Potassium: 4 mmol/L (ref 3.5–5.1)
Sodium: 135 mmol/L (ref 135–145)
Total Protein: 7.9 g/dL (ref 6.5–8.1)

## 2017-04-18 LAB — CA 125: CA 125: 191.4 U/mL — AB (ref 0.0–38.1)

## 2017-04-24 ENCOUNTER — Encounter: Payer: Self-pay | Admitting: Obstetrics and Gynecology

## 2017-04-24 ENCOUNTER — Inpatient Hospital Stay: Payer: Medicare Other | Attending: Obstetrics and Gynecology | Admitting: Obstetrics and Gynecology

## 2017-04-24 VITALS — BP 126/79 | HR 81 | Temp 97.9°F | Resp 18 | Ht 59.0 in | Wt 122.5 lb

## 2017-04-24 DIAGNOSIS — H269 Unspecified cataract: Secondary | ICD-10-CM

## 2017-04-24 DIAGNOSIS — Z9221 Personal history of antineoplastic chemotherapy: Secondary | ICD-10-CM | POA: Diagnosis not present

## 2017-04-24 DIAGNOSIS — C562 Malignant neoplasm of left ovary: Secondary | ICD-10-CM | POA: Diagnosis not present

## 2017-04-24 DIAGNOSIS — F419 Anxiety disorder, unspecified: Secondary | ICD-10-CM

## 2017-04-24 DIAGNOSIS — Z79899 Other long term (current) drug therapy: Secondary | ICD-10-CM

## 2017-04-24 DIAGNOSIS — Z90710 Acquired absence of both cervix and uterus: Secondary | ICD-10-CM | POA: Diagnosis not present

## 2017-04-24 DIAGNOSIS — H409 Unspecified glaucoma: Secondary | ICD-10-CM

## 2017-04-24 DIAGNOSIS — R971 Elevated cancer antigen 125 [CA 125]: Secondary | ICD-10-CM

## 2017-04-24 DIAGNOSIS — K469 Unspecified abdominal hernia without obstruction or gangrene: Secondary | ICD-10-CM

## 2017-04-24 DIAGNOSIS — Z87440 Personal history of urinary (tract) infections: Secondary | ICD-10-CM | POA: Insufficient documentation

## 2017-04-24 DIAGNOSIS — Z90722 Acquired absence of ovaries, bilateral: Secondary | ICD-10-CM | POA: Diagnosis not present

## 2017-04-24 DIAGNOSIS — E7800 Pure hypercholesterolemia, unspecified: Secondary | ICD-10-CM

## 2017-04-24 DIAGNOSIS — I10 Essential (primary) hypertension: Secondary | ICD-10-CM

## 2017-04-24 DIAGNOSIS — K219 Gastro-esophageal reflux disease without esophagitis: Secondary | ICD-10-CM | POA: Diagnosis not present

## 2017-04-24 NOTE — Progress Notes (Signed)
SHE STATES THAT she has something growing on right upper part of abdomen and it hurts some times. It is not new.

## 2017-04-24 NOTE — Progress Notes (Signed)
  Oncology Nurse Navigator Documentation Chaperoned pelvic exam.  Navigator Location: [facility] (04/24/17 1000)   )Navigator Encounter Type: Follow-up Appt (04/24/17 1000)                     Patient Visit Type: GynOnc (04/24/17 1000)                              Time Spent with Patient: 15 (04/24/17 1000)

## 2017-04-24 NOTE — Progress Notes (Signed)
Gynecologic Oncology Interval Visit   Referring Provider: Dr. Hall Busing  Chief Concern: Stage IIIC high grade ovarian cancer.   Subjective:  Kathryn Chandler is a 83 y.o. female who is seen initially in consultation from Dr. Hall Busing with stage IIIC HGSC of the ovary.   Interval history She completed 5 cycles of carboplatinum and Abraxane on Apr 13, 2016 with Dr. Grayland Ormond with normal CA125.  She declined further chemotherapy and CT imaging and CA125 in 8/17 was normal.   She saw Dr. Grayland Ormond in 2/18 and had some vision complaints and memory loss.  Head CT was normal as was chest/abdomen and pelvis CT.  CA125 was normal (21.6)    Kathryn Chandler has done well since last visit in general, but upper abdominal hernia a bit more prominent and occasional RLQ discomfort.  Eating well and normal bowel movements.  Accompanied by her son today.   CA125 has been rising the last few months, but since she is elderly and asymptomatic we have been delaying starting chemotherapy again.    Lab Results  Component Value Date   CA125 191.4 (H) 04/17/2017   CA125 171.8 (H) 04/02/2017   CA125 176.4 (H) 02/15/2017    Gynecologic Oncology History   Kathryn Chandler is a pleasant female seen initially in consultation from Dr. Hall Busing with suboptimally debulked stage IIIC HGSC. She underwent laparotomy and LSO with resection of pelvic mass on 07/20/2015 and has stage IIIC high grade ovarian cancer. Her disease was not amendable to debulking to no gross residual. She would have required several bowel resections, diaphragm stripping, and liver mobilization and due to peritoneal carcinomatosis would not have been debulked to R0. Preoperative CA125 74.2 and HE4=118  She was treated with weekly taxane and carboplatin (initially paclitaxel but was switched to Abraxane on day 1 and 8 and 15). Bevacizumab was added cycles 2 and 3. She tolerated therapy with an excellent decrease in CA125 values. 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 She is status for status post XL on 01/11/2016 with RSO, enterolysis, infragastric omentectomy, mobilization of splenic flexure,  resection of rectosigmoid and repair of fistula, with optimal debulking to < 3 mm. Residual disease was miliary in nature involving small bowel mesentary. Pathology results c/w high grade serous adenocarcinoma except rectosigmoid colon, excision which revealed benign colonic tissue with diverticula.  She completed 5 cycles of carboplatin and Abraxane on Apr 13, 2016 with normal CA125.  She declined further chemotherapy. She saw Dr. Grayland Ormond on 06/03/2016 and CT imaging in August was normal.    In June 2017 she was admitted with UTI, C. Diff colitis and dehydration.    Genetic testing BRCA mutation was negative  Problem List: Patient Active Problem List   Diagnosis Date Noted  . Renal failure 04/17/2016  . Severe protein-calorie malnutrition Altamease Oiler: less than 60% of standard weight) ([place]) 11/07/2015  . Essential (primary) hypertension 11/05/2015  . Colovaginal fistula 11/04/2015  . Acid reflux 08/03/2015  . Bilateral cataracts 08/03/2015  . Chicken pox 08/03/2015  . Allergic state 08/03/2015  . Breast lump 08/03/2015  . Hypercholesteremia 08/03/2015  . Malignant melanoma ([place]) 08/03/2015  . Ovarian cancer ([place]) 07/20/2015  . Head or neck swelling, mass, or lump 08/17/2014    Past Medical History: Past Medical History:  Diagnosis Date  . Anxiety   . Cancer ([place])   . GERD (gastroesophageal reflux disease)   . Glaucoma   . Hypercholesteremia   . Hypertension   . Ovarian cancer (  [place]) 06/2015   Bilateral Oophorectomies.   Marland Kitchen PONV (postoperative nausea and vomiting)     Past Surgical History: Past Surgical History:  Procedure Laterality Date  . ABDOMINAL HYSTERECTOMY    . BREAST BIOPSY Right 1970's  . EXPLORATORY LAPAROTOMY W/ BOWEL RESECTION  01/11/2016   XL optimal debulking rectosigmoid resection and reanastomosis  .  HAND SURGERY     Right  . LAPAROTOMY Left 07/20/2015   Procedure: LAPAROTOMY with Left Salpingooophrectomy;  Surgeon: Honor Loh Ward, MD;  Location: [facility];  Service: Gynecology;  Laterality: Left;  . LAPAROTOMY N/A 07/20/2015   Procedure: LAPAROTOMY;  Surgeon: Gillis Ends, MD;  Location: [facility];  Service: Gynecology;  Laterality: N/A;  . PERIPHERAL VASCULAR CATHETERIZATION N/A 08/10/2015   Procedure: Glori Luis Cath Insertion;  Surgeon: Algernon Huxley, MD;  Location: [place] [place] [facility];  Service: Cardiovascular;  Laterality: N/A;  . REPLACEMENT TOTAL KNEE     Right  . SALPINGOOPHORECTOMY Right 07/20/2015   Procedure: SALPINGO OOPHORECTOMY;  Surgeon: Gillis Ends, MD;  Location: [facility];  Service: Gynecology;  Laterality: Right;    Past Gynecologic History:  Menopause: 1976 06/07/2014 Mammogram IMPRESSION: No mammographic evidence of malignancy. A result letter of this screening mammogram will be mailed directly to the patient.   OB History:  OB History  Gravida Para Term Preterm AB Living  3 1     2     SAB TAB Ectopic Multiple Live Births  2            # Outcome Date GA Lbr Len/2nd Weight Sex Delivery Anes PTL Lv  3 Para           2 SAB           1 SAB               Family History: Family History  Problem Relation Age of Onset  . Stomach cancer Mother   . Osteoporosis Mother   . Breast cancer Neg Hx     Social History: Social History   Social History  . Marital status: Widowed    Spouse name: N/A  . Number of children: N/A  . Years of education: N/A   Occupational History  .      Retired   Social History Main Topics  . Smoking status: Never Smoker  . Smokeless tobacco: Never Used  . Alcohol use No  . Drug use: No  . Sexual activity: Not on file   Other Topics Concern  . Not on file   Social History Narrative  . No narrative on file    Allergies: Allergies  Allergen Reactions  . Clindamycin/Lincomycin Diarrhea and Other (See  Comments)    Reaction:  Antibiotic colitis   . Codeine Anxiety    Current Medications: Current Outpatient Prescriptions  Medication Sig Dispense Refill  . acetaminophen (TYLENOL) 500 MG tablet Take 500-1,000 mg by mouth every 6 (six) hours as needed for mild pain, fever or headache.     . bimatoprost (LUMIGAN) 0.01 % SOLN Place 1 drop into both eyes at bedtime.    . fluticasone (FLONASE) 50 MCG/ACT nasal spray Place 2 sprays into both nostrils daily as needed for rhinitis.     Marland Kitchen lidocaine-prilocaine (EMLA) cream Apply 1 application topically as needed (prior to accessing port).    . LORazepam (ATIVAN) 0.5 MG tablet Take 0.5 mg by mouth 2 (two) times daily.     . meloxicam (MOBIC) 15 MG tablet Take 15 mg  by mouth daily.     Marland Kitchen omeprazole (PRILOSEC) 20 MG capsule Take 20 mg by mouth daily.     . ondansetron (ZOFRAN) 4 MG tablet Take 1 tablet (4 mg total) by mouth every 6 (six) hours as needed for nausea. 30 tablet 3  . simvastatin (ZOCOR) 20 MG tablet Take 20 mg by mouth at bedtime.      No current facility-administered medications for this visit.    Facility-Administered Medications Ordered in Other Visits  Medication Dose Route Frequency Provider Last Rate Last Dose  . diphenhydrAMINE (BENADRYL) injection 12.5 mg  12.5 mg Intravenous Once Choksi, Delorise Shiner, MD      . methylPREDNISolone sodium succinate (SOLU-MEDROL) 125 mg/2 mL injection 100 mg  100 mg Intravenous Once Choksi, Delorise Shiner, MD        Review of Systems General: negative  HEENT: no complaints  Lungs: no complaints  Cardiac: no complaints  GI: no complaints  GU: no complaints  Musculoskeletal: no complaints  Extremities: no complaints  Skin: skin issues  Neuro: no complaints  Endocrine: no complaints  Psych: no complaints      Objective:  Physical Examination:  BP 126/79   Pulse 81   Temp 97.9 F (36.6 C) (Oral)   Resp 18   Ht 4' 11"  (1.499 m)   Wt 122 lb 8 oz (55.6 kg)   BMI 24.74 kg/m   Body mass index is  24.74 kg/m.   ECOG Performance Status: 1  General appearance: alert, cooperative and appears stated age HEENT:PERRLA, extra ocular movement intact and sclera clear, anicteric No carotid bruits.  Heat: regular rate and rhythm.  No murmers Lungs: clear to A an P.  No rales or rhonchi Abdomen: soft, nontender and nondistended. No palpable masses or ascites, well healed incision.  There is a small hernia in the midline upper abdomen.   Neurological exam reveals alert, oriented, normal speech, no focal findings or movement disorder noted. Extremities: Negative for edema or cords Neuro: Grossly intact  Pelvic: exam chaperoned by nurse;  Vulva: normal appearing vulva with no masses, tenderness or lesions; Vagina: normal vagina; Bimanual and Rectal: no masses or nodularity palpable.    Assessment:  Kathryn Chandler is a 83 y.o. female with stage IIIc high grade serous ovarian cancer s/p suboptimal debulking, chemotherapy with weekly abraxane, carboplatin, and bevacizumab, bowel perforation/RV fistula, interval optimally debulking with rectosigmoid resection and reanastomosis for rectovaginal fistula and resumption of chemotherapy without bevacizumab.  NED after 5 cycles of chemotherapy 5/17, but CA125 rising now and this likely represents some regrowth of cancer.  CT scan 2/18 did not show any disease.    Medical co-morbidities complicating care: prior abdominal surgery, frailty, diverticular disease. History of UTI. History of C.Diff.     Plan:   Problem List Items Addressed This Visit    None    Visit Diagnoses    Malignant neoplasm of left ovary ([place])    -  Primary     We again discussed with the patient and her son that the rising CA125 likely is due to regrowth of ovarian cancer.  In view of her advanced age and because she is relatively asymptomatic, we recommend watchful waiting as opposed to restarting chemotherapy now. This is also her strong preference.  She is still platinum sensitive  and would be a candidate for retreatment with carboplatin +/- Doxil when she becomes more symptomatic.    They are in agreement with this plan. She will follow up in 2 months  with CA125 and Dr. Grayland Ormond also ordered a CT scan.  She can return sooner as needed.   Mellody Drown, MD  CC:  Larey Days, MD  Albina Billet, MD [address]   [place], [place] [address] [phone]

## 2017-04-24 NOTE — Progress Notes (Signed)
CA 125 will be performed 8/17 with Dr. Grayland Ormond

## 2017-05-20 ENCOUNTER — Inpatient Hospital Stay: Payer: Medicare Other | Attending: Oncology

## 2017-05-20 ENCOUNTER — Inpatient Hospital Stay (HOSPITAL_BASED_OUTPATIENT_CLINIC_OR_DEPARTMENT_OTHER): Payer: Medicare Other | Admitting: Oncology

## 2017-05-20 VITALS — BP 134/83 | HR 116 | Temp 98.0°F | Resp 20 | Wt 122.5 lb

## 2017-05-20 DIAGNOSIS — R531 Weakness: Secondary | ICD-10-CM | POA: Diagnosis not present

## 2017-05-20 DIAGNOSIS — K219 Gastro-esophageal reflux disease without esophagitis: Secondary | ICD-10-CM | POA: Insufficient documentation

## 2017-05-20 DIAGNOSIS — R102 Pelvic and perineal pain: Secondary | ICD-10-CM | POA: Diagnosis not present

## 2017-05-20 DIAGNOSIS — Z800 Family history of malignant neoplasm of digestive organs: Secondary | ICD-10-CM | POA: Diagnosis not present

## 2017-05-20 DIAGNOSIS — R5383 Other fatigue: Secondary | ICD-10-CM | POA: Diagnosis not present

## 2017-05-20 DIAGNOSIS — R630 Anorexia: Secondary | ICD-10-CM | POA: Diagnosis not present

## 2017-05-20 DIAGNOSIS — R140 Abdominal distension (gaseous): Secondary | ICD-10-CM | POA: Insufficient documentation

## 2017-05-20 DIAGNOSIS — R1900 Intra-abdominal and pelvic swelling, mass and lump, unspecified site: Secondary | ICD-10-CM

## 2017-05-20 DIAGNOSIS — E7800 Pure hypercholesterolemia, unspecified: Secondary | ICD-10-CM

## 2017-05-20 DIAGNOSIS — Z90722 Acquired absence of ovaries, bilateral: Secondary | ICD-10-CM | POA: Insufficient documentation

## 2017-05-20 DIAGNOSIS — F419 Anxiety disorder, unspecified: Secondary | ICD-10-CM | POA: Insufficient documentation

## 2017-05-20 DIAGNOSIS — R627 Adult failure to thrive: Secondary | ICD-10-CM | POA: Diagnosis not present

## 2017-05-20 DIAGNOSIS — C562 Malignant neoplasm of left ovary: Secondary | ICD-10-CM

## 2017-05-20 DIAGNOSIS — I10 Essential (primary) hypertension: Secondary | ICD-10-CM | POA: Diagnosis not present

## 2017-05-20 DIAGNOSIS — Z95828 Presence of other vascular implants and grafts: Secondary | ICD-10-CM

## 2017-05-20 DIAGNOSIS — K5900 Constipation, unspecified: Secondary | ICD-10-CM | POA: Insufficient documentation

## 2017-05-20 DIAGNOSIS — Z79899 Other long term (current) drug therapy: Secondary | ICD-10-CM | POA: Insufficient documentation

## 2017-05-20 DIAGNOSIS — C569 Malignant neoplasm of unspecified ovary: Secondary | ICD-10-CM

## 2017-05-20 DIAGNOSIS — R110 Nausea: Secondary | ICD-10-CM | POA: Diagnosis not present

## 2017-05-20 LAB — COMPREHENSIVE METABOLIC PANEL
ALBUMIN: 3.6 g/dL (ref 3.5–5.0)
ALT: 26 U/L (ref 14–54)
AST: 39 U/L (ref 15–41)
Alkaline Phosphatase: 100 U/L (ref 38–126)
Anion gap: 9 (ref 5–15)
BILIRUBIN TOTAL: 0.7 mg/dL (ref 0.3–1.2)
BUN: 14 mg/dL (ref 6–20)
CALCIUM: 8.9 mg/dL (ref 8.9–10.3)
CO2: 25 mmol/L (ref 22–32)
CREATININE: 0.9 mg/dL (ref 0.44–1.00)
Chloride: 99 mmol/L — ABNORMAL LOW (ref 101–111)
GFR calc Af Amer: >60 mL/min (ref >60)
GFR, EST NON AFRICAN AMERICAN: 56 mL/min — AB (ref >60)
GLUCOSE: 135 mg/dL — AB (ref 65–99)
Potassium: 4.1 mmol/L (ref 3.5–5.1)
Sodium: 133 mmol/L — ABNORMAL LOW (ref 135–145)
TOTAL PROTEIN: 7.3 g/dL (ref 6.5–8.1)

## 2017-05-20 LAB — CBC WITH DIFFERENTIAL/PLATELET
BASOS ABS: 0.1 10*3/uL (ref 0–0.1)
BASOS PCT: 1 %
Eosinophils Absolute: 0 10*3/uL (ref 0–0.7)
Eosinophils Relative: 1 %
HEMATOCRIT: 33.7 % — AB (ref 35.0–47.0)
HEMOGLOBIN: 11.4 g/dL — AB (ref 12.0–16.0)
LYMPHS PCT: 29 %
Lymphs Abs: 2.7 10*3/uL (ref 1.0–3.6)
MCH: 28.7 pg (ref 26.0–34.0)
MCHC: 33.8 g/dL (ref 32.0–36.0)
MCV: 84.9 fL (ref 80.0–100.0)
MONO ABS: 0.9 10*3/uL (ref 0.2–0.9)
MONOS PCT: 10 %
NEUTROS ABS: 5.5 10*3/uL (ref 1.4–6.5)
NEUTROS PCT: 59 %
Platelets: 349 10*3/uL (ref 150–440)
RBC: 3.97 MIL/uL (ref 3.80–5.20)
RDW: 14.4 % (ref 11.5–14.5)
WBC: 9.2 10*3/uL (ref 3.6–11.0)

## 2017-05-20 IMAGING — DX DG CHEST 1V PORT
1 series · 1 of 1 positions shown · non-contrast
Comparison: Chest radiograph March 13, 2015 and chest CT November 04, 2015

CLINICAL DATA: Ovarian carcinoma. Generalized weakness and diarrhea

EXAM:
PORTABLE CHEST 1 VIEW

[chest ap]
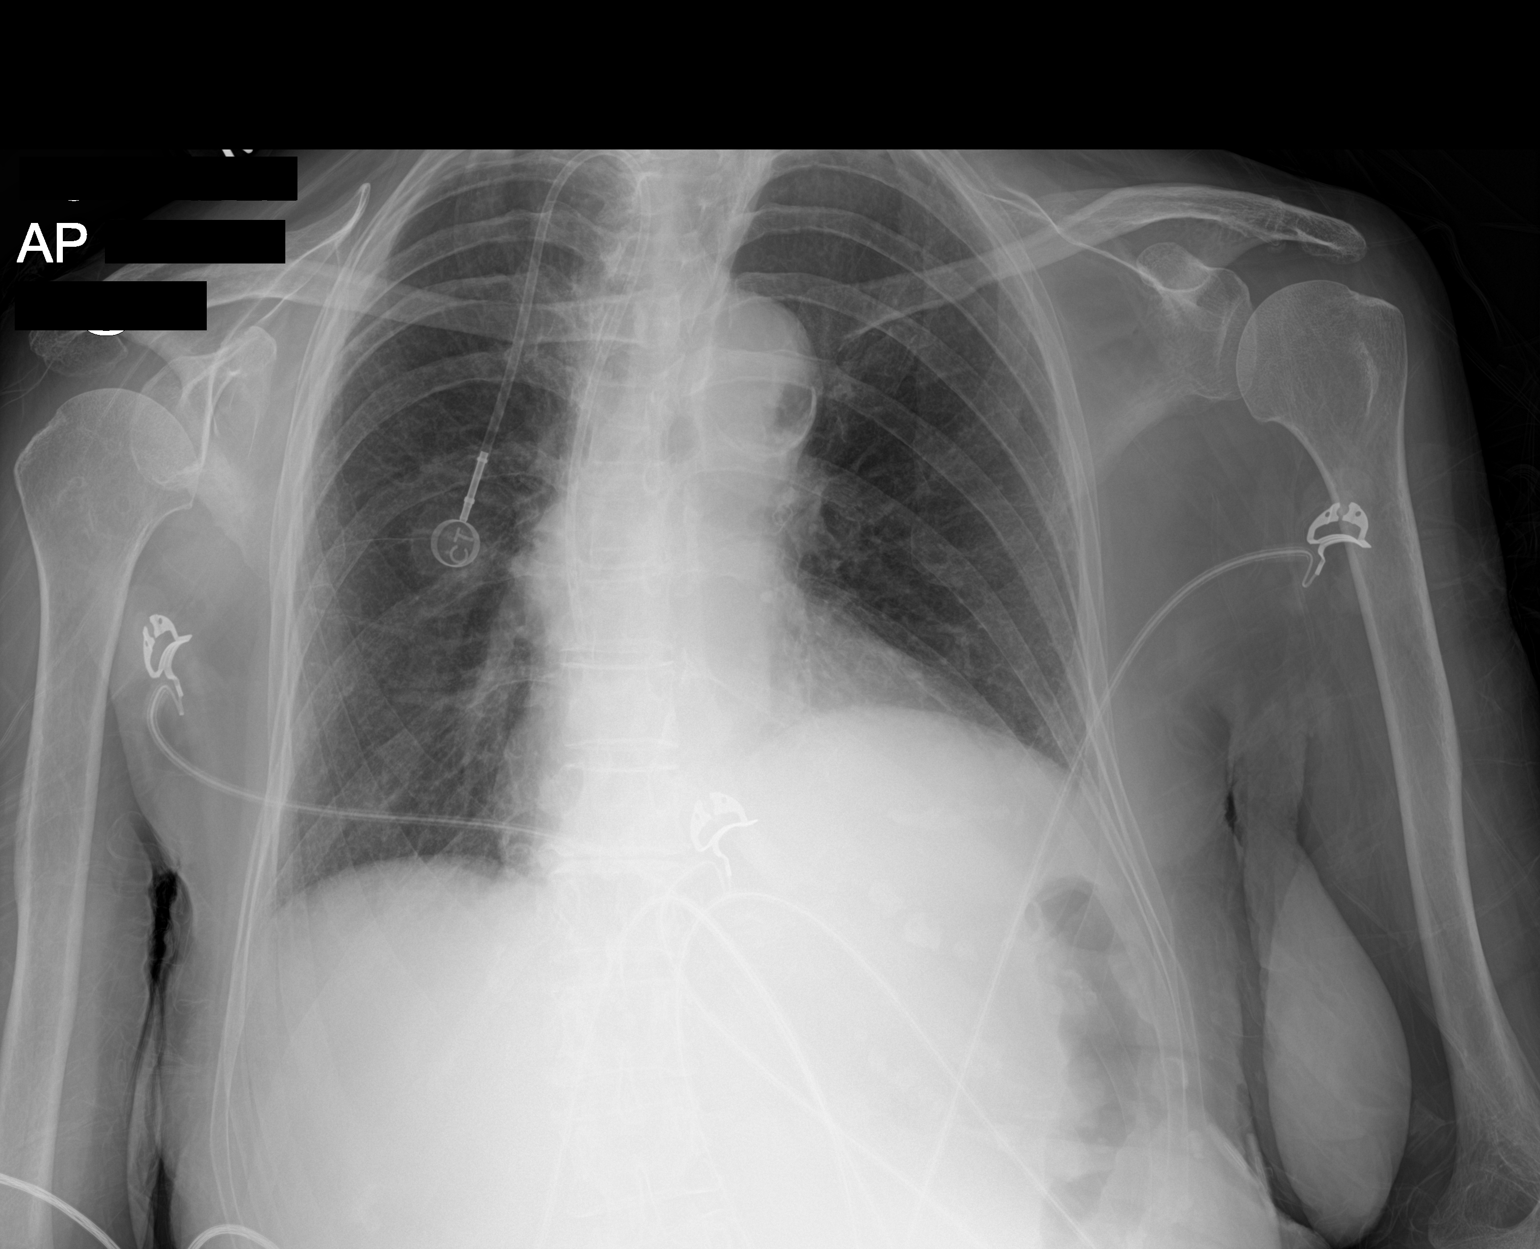

[1 of 1 positions shown; findings below may reference images not displayed]

FINDINGS: There is no edema or consolidation. Heart is upper normal in size
with pulmonary vascularity within normal limits. There is
atherosclerotic calcification in the aortic arch region. Port-A-Cath
tip is in superior vena cava. No pneumothorax. No adenopathy. No
bone lesions evident.
IMPRESSION: No edema or consolidation.

## 2017-05-20 MED ORDER — HEPARIN SOD (PORK) LOCK FLUSH 100 UNIT/ML IV SOLN
500.0000 [IU] | Freq: Once | INTRAVENOUS | Status: AC
  Administered 2017-05-20: 500 [IU] via INTRAVENOUS

## 2017-05-20 MED ORDER — SODIUM CHLORIDE 0.9% FLUSH
10.0000 mL | INTRAVENOUS | Status: AC | PRN
  Administered 2017-05-20: 10 mL via INTRAVENOUS
  Filled 2017-05-20: qty 10

## 2017-05-20 NOTE — Progress Notes (Signed)
Patient here today for follow up regarding new abdominal mass.

## 2017-05-20 NOTE — Progress Notes (Signed)
[place]  [phone] [phone]  ID: Kathryn Chandler OB: Nov 10, 1929  MR#: [id number]  IRW#:[id number]  Patient Care Team: Albina Billet, MD as PCP - General (Internal Medicine) Gillis Ends, MD as Referring Physician (Obstetrics and Gynecology) Ward, Honor Loh, MD as Referring Physician (Obstetrics and Gynecology) Lloyd Huger, MD as Consulting Physician (Oncology)  CHIEF COMPLAINT: Stage IIIc left ovarian cancer status post suboptimal debulking and chemotherapy.  INTERVAL HISTORY: Patient returns to clinic today as an add-on for concern of a painless mass for further evaluation and laboratory work. She currently feels well and is asymptomatic. She does not complain of weakness or fatigue today. She has no neurologic complaints. She denies any fevers. She denies any chest pain, shortness of breath, cough, or hemoptysis. She has a fair appetite, but denies weight loss. She has no nausea, vomiting, constipation, or diarrhea. She denies any abdominal bloating.  She has no urinary complaints. Patient offers no specific complaints today.  REVIEW OF SYSTEMS:   Review of Systems  Constitutional: Negative for fever, malaise/fatigue and weight loss.  Eyes: Negative for blurred vision.  Respiratory: Negative.  Negative for cough and shortness of breath.   Cardiovascular: Negative.  Negative for chest pain and leg swelling.  Gastrointestinal: Negative.  Negative for abdominal pain, blood in stool, constipation, diarrhea, melena, nausea and vomiting.  Genitourinary: Negative.   Musculoskeletal: Negative.   Neurological: Negative.  Negative for weakness.  Psychiatric/Behavioral: Negative.  The patient is not nervous/anxious.     As per HPI. Otherwise, a complete review of systems is negative.  PAST MEDICAL HISTORY: Past Medical History:  Diagnosis Date  . Anxiety   . Cancer ([place])   . GERD (gastroesophageal reflux disease)   .  Glaucoma   . Hypercholesteremia   . Hypertension   . Ovarian cancer ([place]) 06/2015   Bilateral Oophorectomies.   Marland Kitchen PONV (postoperative nausea and vomiting)     PAST SURGICAL HISTORY: Past Surgical History:  Procedure Laterality Date  . ABDOMINAL HYSTERECTOMY    . BREAST BIOPSY Right 1970's  . EXPLORATORY LAPAROTOMY W/ BOWEL RESECTION  01/11/2016   XL optimal debulking rectosigmoid resection and reanastomosis  . HAND SURGERY     Right  . LAPAROTOMY Left 07/20/2015   Procedure: LAPAROTOMY with Left Salpingooophrectomy;  Surgeon: Honor Loh Ward, MD;  Location: [facility];  Service: Gynecology;  Laterality: Left;  . LAPAROTOMY N/A 07/20/2015   Procedure: LAPAROTOMY;  Surgeon: Gillis Ends, MD;  Location: [facility];  Service: Gynecology;  Laterality: N/A;  . PERIPHERAL VASCULAR CATHETERIZATION N/A 08/10/2015   Procedure: Glori Luis Cath Insertion;  Surgeon: Algernon Huxley, MD;  Location: [place] [facility];  Service: Cardiovascular;  Laterality: N/A;  . REPLACEMENT TOTAL KNEE     Right  . SALPINGOOPHORECTOMY Right 07/20/2015   Procedure: SALPINGO OOPHORECTOMY;  Surgeon: Gillis Ends, MD;  Location: [facility];  Service: Gynecology;  Laterality: Right;    FAMILY HISTORY: Family History  Problem Relation Age of Onset  . Stomach cancer Mother   . Osteoporosis Mother   . Breast cancer Neg Hx        ADVANCED DIRECTIVES:    HEALTH MAINTENANCE: Social History  Substance Use Topics  . Smoking status: Never Smoker  . Smokeless tobacco: Never Used  . Alcohol use No     Colonoscopy:  PAP:  Bone density:  Lipid panel:  Allergies  Allergen Reactions  . Clindamycin/Lincomycin Diarrhea and Other (See Comments)    Reaction:  Antibiotic colitis   . Codeine Anxiety    Current Outpatient Prescriptions  Medication Sig Dispense Refill  . acetaminophen (TYLENOL) 500 MG tablet Take 500-1,000 mg by mouth every 6 (six) hours as needed for mild pain, fever or headache.     .  bimatoprost (LUMIGAN) 0.01 % SOLN Place 1 drop into both eyes at bedtime.    . fluticasone (FLONASE) 50 MCG/ACT nasal spray Place 2 sprays into both nostrils daily as needed for rhinitis.     Marland Kitchen lidocaine-prilocaine (EMLA) cream Apply 1 application topically as needed (prior to accessing port).    . LORazepam (ATIVAN) 0.5 MG tablet Take 0.5 mg by mouth 2 (two) times daily.     . meloxicam (MOBIC) 15 MG tablet Take 15 mg by mouth daily.     Marland Kitchen omeprazole (PRILOSEC) 20 MG capsule Take 20 mg by mouth daily.     . ondansetron (ZOFRAN) 4 MG tablet Take 1 tablet (4 mg total) by mouth every 6 (six) hours as needed for nausea. 30 tablet 3  . simvastatin (ZOCOR) 20 MG tablet Take 20 mg by mouth at bedtime.      No current facility-administered medications for this visit.    Facility-Administered Medications Ordered in Other Visits  Medication Dose Route Frequency Provider Last Rate Last Dose  . diphenhydrAMINE (BENADRYL) injection 12.5 mg  12.5 mg Intravenous Once Choksi, Delorise Shiner, MD      . methylPREDNISolone sodium succinate (SOLU-MEDROL) 125 mg/2 mL injection 100 mg  100 mg Intravenous Once Choksi, Janak, MD      . sodium chloride flush (NS) 0.9 % injection 10 mL  10 mL Intravenous PRN Charlaine Dalton R, MD   10 mL at 05/20/17 1227    OBJECTIVE: Vitals:   05/20/17 1344  BP: 134/83  Pulse: (!) 116  Resp: 20  Temp: 98 F (36.7 C)     Body mass index is 24.74 kg/m.    ECOG FS:1 - Symptomatic but completely ambulatory  General: Thin, no acute distress. Eyes: Pink conjunctiva, anicteric sclera. Lungs: Clear to auscultation bilaterally. Heart: Regular rate and rhythm. No rubs, murmurs, or gallops. Abdomen: Midline reducible hernia, nontender. Musculoskeletal: No edema, cyanosis, or clubbing. Neuro: Alert, answering all questions appropriately. Cranial nerves grossly intact. Skin: No rashes or petechiae noted. Psych: Normal affect.   LAB RESULTS:  Lab Results  Component Value Date   NA  133 (L) 05/20/2017   K 4.1 05/20/2017   CL 99 (L) 05/20/2017   CO2 25 05/20/2017   GLUCOSE 135 (H) 05/20/2017   BUN 14 05/20/2017   CREATININE 0.90 05/20/2017   CALCIUM 8.9 05/20/2017   PROT 7.3 05/20/2017   ALBUMIN 3.6 05/20/2017   AST 39 05/20/2017   ALT 26 05/20/2017   ALKPHOS 100 05/20/2017   BILITOT 0.7 05/20/2017   GFRNONAA 56 (L) 05/20/2017   GFRAA >60 05/20/2017    Lab Results  Component Value Date   WBC 9.2 05/20/2017   NEUTROABS 5.5 05/20/2017   HGB 11.4 (L) 05/20/2017   HCT 33.7 (L) 05/20/2017   MCV 84.9 05/20/2017   PLT 349 05/20/2017    Lab Results  Component Value Date   CA125 191.4 (H) 04/17/2017    STUDIES: No results found.  ASSESSMENT: Stage IIIc left ovarian cancer status post suboptimal debulking and chemotherapy.  PLAN:    1. Stage IIIc left ovarian cancer: Patient previously underwent suboptimal debulking as well as adjuvant chemotherapy. She completed 5 cycles of carboplatinum and Abraxane on Apr 13, 2016.  Previously, after lengthy discussion with the patient, she declined any further treatment. Restaging CT scan completed on March 02, 2017 reviewed independently no evidence of recurrence. Her CA-125 has trended up significantly. Previously after lengthy discussion with the patient and her son, because she is asymptomatic she did not wish to pursue any additional treatment at this time. Patient has been instructed to keep her previously scheduled follow-up appointment for repeat imaging and further evaluation in 3 months. 2. Abdominal mass: Appears to be a reducible hernia. Repeat imaging in 6 weeks as above. 3. Hypertension: Patient's blood pressure is within normal limits today. Monitor.   Approximately 20 minutes was spent in discussion of which greater than 50% was consultation.  Patient expressed understanding and was in agreement with this plan. She also understands that She can call clinic at any time with any questions, concerns, or  complaints.     Lloyd Huger, MD   05/20/2017 2:16 PM

## 2017-05-21 LAB — CA 125: CA 125: 174.9 U/mL — AB (ref 0.0–38.1)

## 2017-06-02 ENCOUNTER — Encounter: Payer: Self-pay | Admitting: Obstetrics and Gynecology

## 2017-06-02 ENCOUNTER — Encounter: Payer: Self-pay | Admitting: Oncology

## 2017-06-04 ENCOUNTER — Telehealth: Payer: Self-pay

## 2017-06-04 NOTE — Telephone Encounter (Signed)
  Oncology Nurse Navigator Documentation Received call from son, [name]. He was instructed by Dr. Fransisca Connors to have MS. Cipriani seen earlier if she starts to show new symptoms. He reports she is really not feeling well and not able to eat very much. Would like to be seen this week if possible. Appointment made for 7/18 with Dr. Theora Gianotti at 1345. Navigator Location: [facility] (06/04/17 0900)   )Navigator Encounter Type: Telephone (06/04/17 0900)                     Patient Visit Type: GynOnc (06/04/17 0900)                              Time Spent with Patient: 15 (06/04/17 0900)

## 2017-06-05 ENCOUNTER — Observation Stay
Admission: AD | Admit: 2017-06-05 | Discharge: 2017-06-07 | Disposition: A | Discharge status: Home / Self Care | Payer: Medicare Other | Source: Ambulatory Visit | Attending: Internal Medicine | Admitting: Internal Medicine

## 2017-06-05 ENCOUNTER — Encounter: Payer: Self-pay | Admitting: Obstetrics and Gynecology

## 2017-06-05 ENCOUNTER — Inpatient Hospital Stay (HOSPITAL_BASED_OUTPATIENT_CLINIC_OR_DEPARTMENT_OTHER): Payer: Medicare Other | Admitting: Oncology

## 2017-06-05 ENCOUNTER — Inpatient Hospital Stay (HOSPITAL_BASED_OUTPATIENT_CLINIC_OR_DEPARTMENT_OTHER): Payer: Medicare Other | Admitting: Obstetrics and Gynecology

## 2017-06-05 ENCOUNTER — Inpatient Hospital Stay: Admission: AD | Admit: 2017-06-05 | Payer: Self-pay | Source: Ambulatory Visit | Admitting: Internal Medicine

## 2017-06-05 VITALS — BP 124/84 | HR 55 | Temp 98.1°F | Resp 20 | Ht 59.0 in | Wt 122.7 lb

## 2017-06-05 DIAGNOSIS — R112 Nausea with vomiting, unspecified: Secondary | ICD-10-CM

## 2017-06-05 DIAGNOSIS — R140 Abdominal distension (gaseous): Secondary | ICD-10-CM

## 2017-06-05 DIAGNOSIS — I119 Hypertensive heart disease without heart failure: Secondary | ICD-10-CM | POA: Insufficient documentation

## 2017-06-05 DIAGNOSIS — K219 Gastro-esophageal reflux disease without esophagitis: Secondary | ICD-10-CM | POA: Diagnosis not present

## 2017-06-05 DIAGNOSIS — R5383 Other fatigue: Secondary | ICD-10-CM | POA: Diagnosis not present

## 2017-06-05 DIAGNOSIS — C569 Malignant neoplasm of unspecified ovary: Secondary | ICD-10-CM

## 2017-06-05 DIAGNOSIS — Z79899 Other long term (current) drug therapy: Secondary | ICD-10-CM | POA: Insufficient documentation

## 2017-06-05 DIAGNOSIS — R630 Anorexia: Secondary | ICD-10-CM

## 2017-06-05 DIAGNOSIS — H409 Unspecified glaucoma: Secondary | ICD-10-CM | POA: Insufficient documentation

## 2017-06-05 DIAGNOSIS — R627 Adult failure to thrive: Secondary | ICD-10-CM | POA: Diagnosis not present

## 2017-06-05 DIAGNOSIS — R1010 Upper abdominal pain, unspecified: Secondary | ICD-10-CM | POA: Diagnosis not present

## 2017-06-05 DIAGNOSIS — K5900 Constipation, unspecified: Secondary | ICD-10-CM

## 2017-06-05 DIAGNOSIS — C562 Malignant neoplasm of left ovary: Secondary | ICD-10-CM | POA: Diagnosis not present

## 2017-06-05 DIAGNOSIS — Z66 Do not resuscitate: Secondary | ICD-10-CM | POA: Diagnosis not present

## 2017-06-05 DIAGNOSIS — K8020 Calculus of gallbladder without cholecystitis without obstruction: Secondary | ICD-10-CM | POA: Diagnosis not present

## 2017-06-05 DIAGNOSIS — Z800 Family history of malignant neoplasm of digestive organs: Secondary | ICD-10-CM

## 2017-06-05 DIAGNOSIS — J90 Pleural effusion, not elsewhere classified: Secondary | ICD-10-CM | POA: Diagnosis not present

## 2017-06-05 DIAGNOSIS — R6251 Failure to thrive (child): Secondary | ICD-10-CM

## 2017-06-05 DIAGNOSIS — R109 Unspecified abdominal pain: Secondary | ICD-10-CM | POA: Diagnosis present

## 2017-06-05 DIAGNOSIS — Z90710 Acquired absence of both cervix and uterus: Secondary | ICD-10-CM | POA: Diagnosis not present

## 2017-06-05 DIAGNOSIS — R531 Weakness: Secondary | ICD-10-CM | POA: Diagnosis not present

## 2017-06-05 DIAGNOSIS — E7800 Pure hypercholesterolemia, unspecified: Secondary | ICD-10-CM | POA: Insufficient documentation

## 2017-06-05 DIAGNOSIS — Z90722 Acquired absence of ovaries, bilateral: Secondary | ICD-10-CM

## 2017-06-05 DIAGNOSIS — R1030 Lower abdominal pain, unspecified: Secondary | ICD-10-CM

## 2017-06-05 DIAGNOSIS — R1900 Intra-abdominal and pelvic swelling, mass and lump, unspecified site: Secondary | ICD-10-CM | POA: Diagnosis not present

## 2017-06-05 DIAGNOSIS — R180 Malignant ascites: Secondary | ICD-10-CM | POA: Insufficient documentation

## 2017-06-05 DIAGNOSIS — I10 Essential (primary) hypertension: Secondary | ICD-10-CM | POA: Diagnosis not present

## 2017-06-05 DIAGNOSIS — F419 Anxiety disorder, unspecified: Secondary | ICD-10-CM | POA: Diagnosis not present

## 2017-06-05 DIAGNOSIS — R102 Pelvic and perineal pain: Secondary | ICD-10-CM | POA: Diagnosis not present

## 2017-06-05 DIAGNOSIS — R110 Nausea: Secondary | ICD-10-CM

## 2017-06-05 DIAGNOSIS — R188 Other ascites: Secondary | ICD-10-CM

## 2017-06-05 LAB — CBC
HCT: 34.4 % — ABNORMAL LOW (ref 35.0–47.0)
Hemoglobin: 11.6 g/dL — ABNORMAL LOW (ref 12.0–16.0)
MCH: 28.7 pg (ref 26.0–34.0)
MCHC: 33.8 g/dL (ref 32.0–36.0)
MCV: 84.8 fL (ref 80.0–100.0)
PLATELETS: 351 10*3/uL (ref 150–440)
RBC: 4.05 MIL/uL (ref 3.80–5.20)
RDW: 14.4 % (ref 11.5–14.5)
WBC: 9.8 10*3/uL (ref 3.6–11.0)

## 2017-06-05 LAB — COMPREHENSIVE METABOLIC PANEL
ALK PHOS: 90 U/L (ref 38–126)
ALT: 15 U/L (ref 14–54)
ANION GAP: 10 (ref 5–15)
AST: 31 U/L (ref 15–41)
Albumin: 3.3 g/dL — ABNORMAL LOW (ref 3.5–5.0)
BILIRUBIN TOTAL: 0.9 mg/dL (ref 0.3–1.2)
BUN: 12 mg/dL (ref 6–20)
CALCIUM: 8.7 mg/dL — AB (ref 8.9–10.3)
CO2: 26 mmol/L (ref 22–32)
Chloride: 97 mmol/L — ABNORMAL LOW (ref 101–111)
Creatinine, Ser: 0.75 mg/dL (ref 0.44–1.00)
GFR calc non Af Amer: >60 mL/min (ref >60)
Glucose, Bld: 141 mg/dL — ABNORMAL HIGH (ref 65–99)
POTASSIUM: 3.5 mmol/L (ref 3.5–5.1)
SODIUM: 133 mmol/L — AB (ref 135–145)
TOTAL PROTEIN: 7.1 g/dL (ref 6.5–8.1)

## 2017-06-05 LAB — MAGNESIUM: Magnesium: 1.6 mg/dL — ABNORMAL LOW (ref 1.7–2.4)

## 2017-06-05 MED ORDER — SENNOSIDES-DOCUSATE SODIUM 8.6-50 MG PO TABS: 1.0000 | ORAL_TABLET | Freq: Every evening | ORAL | Status: DC | PRN

## 2017-06-05 MED ORDER — LIDOCAINE-PRILOCAINE 2.5-2.5 % EX CREA
TOPICAL_CREAM | Freq: Once | CUTANEOUS | Status: AC
  Administered 2017-06-05: 18:00:00 via TOPICAL
  Filled 2017-06-05: qty 5

## 2017-06-05 MED ORDER — ALBUTEROL SULFATE (2.5 MG/3ML) 0.083% IN NEBU: 2.5000 mg | INHALATION_SOLUTION | RESPIRATORY_TRACT | Status: DC | PRN

## 2017-06-05 MED ORDER — LORAZEPAM 0.5 MG PO TABS
0.5000 mg | ORAL_TABLET | Freq: Two times a day (BID) | ORAL | Status: DC
  Administered 2017-06-05 – 2017-06-07 (×4): 0.5 mg via ORAL
  Filled 2017-06-05 (×4): qty 1

## 2017-06-05 MED ORDER — ONDANSETRON HCL 4 MG/2ML IJ SOLN
4.0000 mg | Freq: Four times a day (QID) | INTRAMUSCULAR | Status: DC | PRN
Start: 2017-06-05 — End: 2017-06-07
  Administered 2017-06-05 – 2017-06-06 (×2): 4 mg via INTRAVENOUS
  Filled 2017-06-05 (×2): qty 2

## 2017-06-05 MED ORDER — ACETAMINOPHEN 325 MG PO TABS: 650.0000 mg | ORAL_TABLET | Freq: Four times a day (QID) | ORAL | Status: DC | PRN

## 2017-06-05 MED ORDER — LATANOPROST 0.005 % OP SOLN
1.0000 [drp] | Freq: Every day | OPHTHALMIC | Status: DC
  Administered 2017-06-06: 1 [drp] via OPHTHALMIC
  Filled 2017-06-05: qty 2.5

## 2017-06-05 MED ORDER — OXYCODONE HCL 5 MG PO TABS: 5.0000 mg | ORAL_TABLET | ORAL | Status: DC | PRN

## 2017-06-05 MED ORDER — HEPARIN SODIUM (PORCINE) 5000 UNIT/ML IJ SOLN
5000.0000 [IU] | Freq: Three times a day (TID) | INTRAMUSCULAR | Status: DC
  Administered 2017-06-05 – 2017-06-07 (×4): 5000 [IU] via SUBCUTANEOUS
  Filled 2017-06-05 (×4): qty 1

## 2017-06-05 MED ORDER — POTASSIUM CHLORIDE IN NACL 20-0.9 MEQ/L-% IV SOLN
INTRAVENOUS | Status: DC
  Administered 2017-06-05: 20:00:00 via INTRAVENOUS
  Administered 2017-06-06: 10:00:00 75 mL/h via INTRAVENOUS
  Filled 2017-06-05 (×3): qty 1000

## 2017-06-05 MED ORDER — PANTOPRAZOLE SODIUM 40 MG PO TBEC
40.0000 mg | DELAYED_RELEASE_TABLET | Freq: Every day | ORAL | Status: DC
  Administered 2017-06-05 – 2017-06-07 (×3): 40 mg via ORAL
  Filled 2017-06-05 (×3): qty 1

## 2017-06-05 MED ORDER — FLUTICASONE PROPIONATE 50 MCG/ACT NA SUSP
2.0000 | Freq: Every day | NASAL | Status: DC | PRN
  Filled 2017-06-05: qty 16

## 2017-06-05 MED ORDER — ONDANSETRON HCL 4 MG PO TABS
4.0000 mg | ORAL_TABLET | Freq: Four times a day (QID) | ORAL | Status: DC | PRN
  Administered 2017-06-07: 4 mg via ORAL
  Filled 2017-06-05: qty 1

## 2017-06-05 MED ORDER — KETOROLAC TROMETHAMINE 15 MG/ML IJ SOLN
15.0000 mg | Freq: Four times a day (QID) | INTRAMUSCULAR | Status: DC | PRN
Start: 2017-06-05 — End: 2017-06-07
  Filled 2017-06-05: qty 1

## 2017-06-05 MED ORDER — ACETAMINOPHEN 650 MG RE SUPP: 650.0000 mg | Freq: Four times a day (QID) | RECTAL | Status: DC | PRN

## 2017-06-05 NOTE — Progress Notes (Signed)
  Oncology Nurse Navigator Documentation Chaperoned pelvic exam. Navigator Location: [facility] (06/05/17 1400)   )                      Patient Visit Type: GynOnc (06/05/17 1400)                              Time Spent with Patient: 15 (06/05/17 1400)

## 2017-06-05 NOTE — Progress Notes (Signed)
Gynecologic Oncology Interval Visit   Referring Provider: Dr. Hall Busing  Chief Concern: stage IIIC ovarian cancer, history of rectovaginal fistula s/p rectosigmoid resection and reanastomosis with optimal interval tumor debulking and chemotherapy.   Subjective:  Kathryn Chandler is a 83 y.o. female who is seen initially in consultation from Dr. Hall Busing with stage IIIC HGSC of the ovary undergoing chemotherapy with Dr. Oliva Bustard.   Kathryn Chandler complains of increasing fatigue, weakness, abdominal pain/bloating; nausea; constipation, pelvic pain and decreased appetite.   She saw Dr. Grayland Ormond on 05/20/2017, she had a negative exam other than an incisional hernia, and he recommended continued surveillance and repeat imaging.       Lab Results  Component Value Date   CA125 174.9 (H) 05/20/2017   CA125 191.4 (H) 04/17/2017   CA125 171.8 (H) 04/02/2017    Gynecologic Oncology History   Kathryn Chandler is a pleasant female who is seen initially in consultation from Dr. Hall Busing with suboptimally debulked stage IIIC HGSC. She underwent laparotomy and LSO with resection of pelvic mass on 07/20/2015 and has stage IIIC ovarian cancer. Her disease was not amendable to debulking to no gross residual. She would have required several bowel resections, diaphragm stripping, and liver mobilization and due to peritoneal carcinomatosis would not have been debulked to R0. Preoperative CA125 74.2 and HE4=118  FINAL PATHOLOGY DIAGNOSIS:  A. LEFT OVARY; OOPHORECTOMY:  - HIGH-GRADE SEROUS CARCINOMA.  - FALLOPIAN TUBE NOT IDENTIFIED.  She has been treated with weekly taxane and carboplatin (initially paclitaxel but was switched to Abraxane on day 1 and 8 and 15). Bevacizumab was added cycles 2 and 3. She is tolerating her therapy with an excellent decrease in CA125 values. 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 She  status for status post XL on 01/11/2016 with RSO, enterolysis, infragastric  omentectomy, mobilization of splenic flexure,  resection of rectosigmoid and repair of fistula, with optimal debulking to < 3 mm. Residual disease is miliary in nature involving small bowel mesentary. Pathology results c/w high grade serous adenocarcinoma except rectosigmoid colon, excision which revealed benign colonic tissue with diverticula.  She completed 5 cycles of carboplatinum and Abraxane on Apr 13, 2016. She declined further chemotherapy.   Rising CA125 in May 2017 confirmed x 2.     04/17/2016 CT scan A/P IMPRESSION: 1. Pancolitis c/w C difficile. 2. Ovarian cancer with omental metastases noted previously not clearly seen today. 3. Chronic findings are described above.  Given asymptomatic state plan for continued surveillance.   01/02/2017 CT scan C/A/P  1. No definitive evidence of metastatic disease. Mesenteric nodule in the anterior left lower quadrant may have been present on 07/04/2016. Continued attention on followup exams is warranted. 2. Aortic atherosclerosis (ICD10-170.0). Coronary artery calcification. 3. Mild subpleural reticulation in the lungs, likely stable and indicative of nonspecific interstitial pneumonitis. 4. Cholelithiasis.  01/02/2017 CT Head  1. No acute intracranial abnormality or abnormal enhancement of the brain identified. 2. Mild progression in moderate chronic microvascular ischemic changes and mild brain parenchymal volume loss from 2009.   Genetic testing BRCA mutation was negative   Problem List: Patient Active Problem List   Diagnosis Date Noted  . Renal failure 04/17/2016  . Severe protein-calorie malnutrition Altamease Oiler: less than 60% of standard weight) ([place]) 11/07/2015  . Essential (primary) hypertension 11/05/2015  . Colovaginal fistula 11/04/2015  . Acid reflux 08/03/2015  . Bilateral cataracts 08/03/2015  . Chicken pox 08/03/2015  . Allergic state 08/03/2015  . Breast lump 08/03/2015  .  Hypercholesteremia 08/03/2015  .  Malignant melanoma ([place]) 08/03/2015  . Ovarian cancer ([place]) 07/20/2015  . Head or neck swelling, mass, or lump 08/17/2014    Past Medical History: Past Medical History:  Diagnosis Date  . Anxiety   . Cancer ([place])   . GERD (gastroesophageal reflux disease)   . Glaucoma   . Hypercholesteremia   . Hypertension   . Ovarian cancer ([place]) 06/2015   Bilateral Oophorectomies.   Marland Kitchen PONV (postoperative nausea and vomiting)     Past Surgical History: Past Surgical History:  Procedure Laterality Date  . ABDOMINAL HYSTERECTOMY    . BREAST BIOPSY Right 1970's  . EXPLORATORY LAPAROTOMY W/ BOWEL RESECTION  01/11/2016   XL optimal debulking rectosigmoid resection and reanastomosis  . HAND SURGERY     Right  . LAPAROTOMY Left 07/20/2015   Procedure: LAPAROTOMY with Left Salpingooophrectomy;  Surgeon: Honor Loh Ward, MD;  Location: [facility];  Service: Gynecology;  Laterality: Left;  . LAPAROTOMY N/A 07/20/2015   Procedure: LAPAROTOMY;  Surgeon: Gillis Ends, MD;  Location: [facility];  Service: Gynecology;  Laterality: N/A;  . PERIPHERAL VASCULAR CATHETERIZATION N/A 08/10/2015   Procedure: Glori Luis Cath Insertion;  Surgeon: Algernon Huxley, MD;  Location: [facility];  Service: Cardiovascular;  Laterality: N/A;  . REPLACEMENT TOTAL KNEE     Right  . SALPINGOOPHORECTOMY Right 07/20/2015   Procedure: SALPINGO OOPHORECTOMY;  Surgeon: Gillis Ends, MD;  Location: [facility];  Service: Gynecology;  Laterality: Right;    Past Gynecologic History:  Menopause: 1976 06/07/2014 Mammogram IMPRESSION: No mammographic evidence of malignancy. A result letter of this screening mammogram will be mailed directly to the patient.   OB History:  OB History  Gravida Para Term Preterm AB Living  3 1     2     SAB TAB Ectopic Multiple Live Births  2            # Outcome Date GA Lbr Len/2nd Weight Sex Delivery Anes PTL Lv  3 Para           2 SAB           1 SAB               Family  History: Family History  Problem Relation Age of Onset  . Stomach cancer Mother   . Osteoporosis Mother   . Breast cancer Neg Hx     Social History: Social History   Social History  . Marital status: Widowed    Spouse name: N/A  . Number of children: N/A  . Years of education: N/A   Occupational History  .      Retired   Social History Main Topics  . Smoking status: Never Smoker  . Smokeless tobacco: Never Used  . Alcohol use No  . Drug use: No  . Sexual activity: Not on file   Other Topics Concern  . Not on file   Social History Narrative  . No narrative on file    Allergies: Allergies  Allergen Reactions  . Clindamycin/Lincomycin Diarrhea and Other (See Comments)    Reaction:  Antibiotic colitis   . Codeine Anxiety    Current Medications: Current Outpatient Prescriptions  Medication Sig Dispense Refill  . acetaminophen (TYLENOL) 500 MG tablet Take 500-1,000 mg by mouth every 6 (six) hours as needed for mild pain, fever or headache.     . bimatoprost (LUMIGAN) 0.01 % SOLN Place 1 drop into both eyes at bedtime.    Marland Kitchen  fluticasone (FLONASE) 50 MCG/ACT nasal spray Place 2 sprays into both nostrils daily as needed for rhinitis.     Marland Kitchen lidocaine-prilocaine (EMLA) cream Apply 1 application topically as needed (prior to accessing port).    . LORazepam (ATIVAN) 0.5 MG tablet Take 0.5 mg by mouth 2 (two) times daily.     . meloxicam (MOBIC) 15 MG tablet Take 15 mg by mouth daily.     Marland Kitchen omeprazole (PRILOSEC) 20 MG capsule Take 20 mg by mouth daily.     . ondansetron (ZOFRAN) 4 MG tablet Take 1 tablet (4 mg total) by mouth every 6 (six) hours as needed for nausea. 30 tablet 3  . simvastatin (ZOCOR) 20 MG tablet Take 20 mg by mouth at bedtime.      No current facility-administered medications for this visit.    Facility-Administered Medications Ordered in Other Visits  Medication Dose Route Frequency Provider Last Rate Last Dose  . diphenhydrAMINE (BENADRYL) injection  12.5 mg  12.5 mg Intravenous Once Choksi, Delorise Shiner, MD      . methylPREDNISolone sodium succinate (SOLU-MEDROL) 125 mg/2 mL injection 100 mg  100 mg Intravenous Once Choksi, Janak, MD      . sodium chloride flush (NS) 0.9 % injection 10 mL  10 mL Intravenous PRN Cammie Sickle, MD   10 mL at 05/20/17 1227    Review of Systems General: weakness/fatigue  HEENT: no complaints  Lungs: no complaints  Cardiac: no complaints  GI: abdominal pain, nausea with intermittent episodes of emesis (last emesis last week), constipation, decreased appetite.  GU: pelvic pain  Musculoskeletal: no complaints  Extremities: no complaints  Skin: skin issues  Neuro: no complaints  Endocrine: no complaints  Psych: depression      Objective:  Physical Examination:  BP 124/84 (BP Location: Right Arm, Patient Position: Sitting)   Pulse (!) 55   Temp 98.1 F (36.7 C) (Oral)   Resp 20   Ht 4' 11"  (1.499 m)   Wt 122 lb 11.2 oz (55.7 kg)   BMI 24.78 kg/m   Body mass index is 24.78 kg/m. BMI increased compared to 22.91 on 02/11/2016; baseline BMI 24.83 on 10/26/2015   ECOG Performance Status: 3 - Symptomatic, >50% confined to bed  General appearance: alert, cooperative and appears stated age HEENT:PERRLA, extra ocular movement intact and sclera clear, anicteric Abdomen: firm, nontender but distended. + distant bowel sounds. No rushes/tinkling appreciated. Non tympanic. Concern for ascites. No palpable masses. Superior incicional reducible hernia (6 cm) o/w well healed incision  Neurological exam reveals alert, oriented, normal speech, no focal findings or movement disorder noted. Lymphatic survey: negative , axillary, and inguinal nodes Extremities: Negative for edema or cords Neuro: Grossly intact  Pelvic: exam chaperoned by nurse;  Vulva: normal appearing vulva with no masses, tenderness or lesions; Vagina: normal vagina with healing at the cuff; Adnexa: surgically absent; Uterus: surgically  absent, vaginal cuff well healed; Cervix: absent; Rectal: not performed.    Assessment:  Kathryn Chandler is a 83 y.o. female diagnosed with stage IIIc high grade serous ovarian cancer s/p suboptimal debulking, chemotherapy s/p chemotherapy with weekly abraxane, carboplatin, and bevacizumab, bowel perforation/RV fistula,  interval optimally debulking with rectosigmoid resection and reanastomosis for rectovaginal fistula and resumption of chemotherapy without bevacizumab. Diverticular disease. History of UTI. History of c.Diff.    Medical co-morbidities complicating care: prior abdominal surgery, frailty, performance status of 3. Rising CA125, symptomatic concern for progressive disease. Exam worrisome for ascites. Failure to thrive. Worsening hernia. Intractable pain.  Intractable low grade nausea with limited oral intake, weight stable.  Plan:   Problem List Items Addressed This Visit    None    Visit Diagnoses    Malignant neoplasm of ovary, unspecified laterality ([place])    -  Primary   Pain of upper abdomen       Failure to thrive (0-17)          I recommended admission for failure to thrive and evaluation for probable recurrent and progressive ovarian cancer. I spoke with Dr. Grayland Ormond and he will contact the hospitalist to discuss admission. Recommend routine labs, CA125 and CT scan. If evidence of ascites she may benefit for a paracentesis.     Gillis Ends, MD    CC:  Larey Days, MD  Albina Billet, MD [address]   [place], [place] [address] [phone]

## 2017-06-05 NOTE — H&P (Signed)
[facility] [facility] NAME: Kathryn Chandler    MR#:  [id number]  DATE OF BIRTH:  06-Jan-1929  DATE OF ADMISSION:  06/05/2017  PRIMARY CARE PHYSICIAN: Albina Billet, MD   REQUESTING/REFERRING PHYSICIAN: Dr. Grayland Ormond  CHIEF COMPLAINT:  No chief complaint on file.  Abdominal pain, intractable nausea and vomiting today. HISTORY OF PRESENT ILLNESS:  Kathryn Chandler  is a 83 y.o. female with a known history of Stage III Ovarian cancer, hypertension and anxiety. The patient was sent for direct admission today due to above chief complaints. She said that she has had abdominal pain for several weeks, worsening and distended recently. She has had intractable nausea and vomiting in the cancer center office. Her CA 125 increased recently. Dr. Grayland Ormond sent patient for direct admission for further treatment and workup.  PAST MEDICAL HISTORY:   Past Medical History:  Diagnosis Date  . Anxiety   . Cancer ([place])   . GERD (gastroesophageal reflux disease)   . Glaucoma   . Hypercholesteremia   . Hypertension   . Ovarian cancer ([place]) 06/2015   Bilateral Oophorectomies.   Marland Kitchen PONV (postoperative nausea and vomiting)     PAST SURGICAL HISTORY:   Past Surgical History:  Procedure Laterality Date  . ABDOMINAL HYSTERECTOMY    . BREAST BIOPSY Right 1970's  . EXPLORATORY LAPAROTOMY W/ BOWEL RESECTION  01/11/2016   XL optimal debulking rectosigmoid resection and reanastomosis  . HAND SURGERY     Right  . LAPAROTOMY Left 07/20/2015   Procedure: LAPAROTOMY with Left Salpingooophrectomy;  Surgeon: Honor Loh Ward, MD;  Location: [facility];  Service: Gynecology;  Laterality: Left;  . LAPAROTOMY N/A 07/20/2015   Procedure: LAPAROTOMY;  Surgeon: Gillis Ends, MD;  Location: [facility];  Service: Gynecology;  Laterality: N/A;  . PERIPHERAL VASCULAR CATHETERIZATION N/A 08/10/2015   Procedure: Glori Luis Cath Insertion;  Surgeon: Algernon Huxley, MD;  Location: [facility];  Service: Cardiovascular;  Laterality: N/A;  . REPLACEMENT TOTAL KNEE     Right  . SALPINGOOPHORECTOMY Right 07/20/2015   Procedure: SALPINGO OOPHORECTOMY;  Surgeon: Gillis Ends, MD;  Location: [facility];  Service: Gynecology;  Laterality: Right;    SOCIAL HISTORY:   Social History  Substance Use Topics  . Smoking status: Never Smoker  . Smokeless tobacco: Never Used  . Alcohol use No    FAMILY HISTORY:   Family History  Problem Relation Age of Onset  . Stomach cancer Mother   . Osteoporosis Mother   . Breast cancer Neg Hx     DRUG ALLERGIES:   Allergies  Allergen Reactions  . Clindamycin/Lincomycin Diarrhea and Other (See Comments)    Reaction:  Antibiotic colitis   . Codeine Anxiety    REVIEW OF SYSTEMS:   Review of Systems  Constitutional: Positive for malaise/fatigue. Negative for chills and fever.  HENT: Negative for sore throat.   Eyes: Negative for blurred vision and double vision.  Respiratory: Negative for cough, shortness of breath, wheezing and stridor.   Cardiovascular: Negative for chest pain, palpitations and leg swelling.  Gastrointestinal: Positive for abdominal pain, nausea and vomiting. Negative for blood in stool, constipation, diarrhea and melena.  Genitourinary: Negative for dysuria and hematuria.  Musculoskeletal: Negative for back pain.  Skin: Negative for itching and rash.  Neurological: Positive for weakness. Negative for dizziness, focal weakness, loss of consciousness and headaches.    MEDICATIONS AT HOME:   Prior to Admission medications  Medication Sig Start Date End Date Taking? Authorizing Provider  LORazepam (ATIVAN) 0.5 MG tablet Take 0.5 mg by mouth 2 (two) times daily.    Yes [provider]  meloxicam (MOBIC) 15 MG tablet Take 15 mg by mouth daily.    Yes [provider]  omeprazole (PRILOSEC) 20 MG capsule Take 20 mg by mouth daily.    Yes [provider]  ondansetron (ZOFRAN) 4  MG tablet Take 1 tablet (4 mg total) by mouth every 6 (six) hours as needed for nausea. 08/03/15  Yes Choksi, Delorise Shiner, MD  simvastatin (ZOCOR) 20 MG tablet Take 20 mg by mouth at bedtime.    Yes [provider]  acetaminophen (TYLENOL) 500 MG tablet Take 500-1,000 mg by mouth every 6 (six) hours as needed for mild pain, fever or headache.     [provider]  bimatoprost (LUMIGAN) 0.01 % SOLN Place 1 drop into both eyes at bedtime.    [provider]  fluticasone (FLONASE) 50 MCG/ACT nasal spray Place 2 sprays into both nostrils daily as needed for rhinitis.     [provider]  lidocaine-prilocaine (EMLA) cream Apply 1 application topically as needed (prior to accessing port).    [provider]      VITAL SIGNS:  Blood pressure (!) 118/56, pulse (!) 102, temperature 98.3 F (36.8 C), temperature source Oral, resp. rate 18, height 4\' 11"  (1.499 m), weight 123 lb 9.6 oz (56.1 kg), SpO2 98 %.  PHYSICAL EXAMINATION:  Physical Exam  GENERAL:  83 y.o.-year-old patient lying in the bed with no acute distress.  EYES: Pupils equal, round, reactive to light and accommodation. No scleral icterus. Extraocular muscles intact.  HEENT: Head atraumatic, normocephalic. Oropharynx and nasopharynx clear.  NECK:  Supple, no jugular venous distention. No thyroid enlargement, no tenderness.  LUNGS: Normal breath sounds bilaterally, no wheezing, rales,rhonchi or crepitation. No use of accessory muscles of respiration.  CARDIOVASCULAR: S1, S2 normal. No murmurs, rubs, or gallops.  ABDOMEN: Soft, tenderness and distension. Bowel sounds present. Large abdominal mass.  EXTREMITIES: No pedal edema, cyanosis, or clubbing.  NEUROLOGIC: Cranial nerves II through XII are intact. Muscle strength 5/5 in all extremities. Sensation intact. Gait not checked.  PSYCHIATRIC: The patient is alert and oriented x 3.  SKIN: No obvious rash, lesion, or ulcer.   LABORATORY PANEL:    CBC No results for input(s): WBC, HGB, HCT, PLT in the last 168 hours. ------------------------------------------------------------------------------------------------------------------  Chemistries  No results for input(s): NA, K, CL, CO2, GLUCOSE, BUN, CREATININE, CALCIUM, MG, AST, ALT, ALKPHOS, BILITOT in the last 168 hours.  Invalid input(s): GFRCGP ------------------------------------------------------------------------------------------------------------------  Cardiac Enzymes No results for input(s): TROPONINI in the last 168 hours. ------------------------------------------------------------------------------------------------------------------  RADIOLOGY:  No results found.    IMPRESSION AND PLAN:   Abdominal pain and intractable nausea vomiting The patient is placed for observation for direct admission.  I will start clear liquid diet, IV fluids support, pain control and Zofran when necessary.  Stage III of ovarian cancer with increased abdominal distention Follow-up BMP. Get a CAT scan of abdomen polys with contrast if renal function allows. Oncology consult from Dr. Grayland Ormond.  All the records are reviewed and case discussed with ED provider. Management plans discussed with the patient, her son and they are in agreement.  CODE STATUS: DO NOT RESUSCITATE  TOTAL TIME TAKING CARE OF THIS PATIENT: 52 minutes.    Demetrios Loll M.D on 06/05/2017 at 7:13 PM  Between 7am to 6pm - Pager - [phone]  After 6pm go to [email or web address] - Proofreader  [facility]  Office  [phone]  CC: Primary care physician; Albina Billet, MD   Note: This dictation was prepared with Dragon dictation along with smaller phrase technology. Any transcriptional errors that result from this process are unintentional.

## 2017-06-05 NOTE — Progress Notes (Addendum)
Symptom Management Consult note [facility]  [phone] [phone]  Patient Care Team: Albina Billet, MD as PCP - General (Internal Medicine) Gillis Ends, MD as Referring Physician (Obstetrics and Gynecology) Ward, Honor Loh, MD as Referring Physician (Obstetrics and Gynecology) Lloyd Huger, MD as Consulting Physician (Oncology)   Name of the patient: Kathryn Chandler  [id number]  [birth year]   Date of visit: 06/05/17  Diagnosis- Stage IIIc left ovarian cancer status post suboptimal debulking and chemotherapy.  Chief complaint/ Reason for visit- Abdominal Pain/ Nausea/ Vomiting/ Bloating/ Constipation  Heme/Onc history: Kathryn Chandler is a pleasant female who is seen initially in consultation from Dr. Hall Busing with suboptimally debulked stage IIIC HGSC. She underwent laparotomy and LSO with resection of pelvic mass on 07/20/2015 and has stage IIIC ovarian cancer. Her disease was not amendable to debulking to no gross residual. She would have required several bowel resections, diaphragm stripping, and liver mobilization and due to peritoneal carcinomatosis would not have been debulked to R0. Preoperative CA125 74.2. She has been treated with weekly taxane and carboplatin (initially paclitaxel but was switched to Abraxane on day 1 and 8 and 15). Bevacizumab was added cycles 2 and 3. She is tolerating her therapy with an excellent decrease in CA125 values. 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 She  status for status post XL on 01/11/2016 with RSO, enterolysis, infragastric omentectomy, mobilization of splenic flexure,  resection of rectosigmoid and repair of fistula, with optimal debulking to < 3 mm. Residual disease is miliary in nature involving small bowel mesentary. Pathology results c/w high grade serous adenocarcinoma except rectosigmoid colon, excision which revealed benign colonic tissue with  diverticula.  She completed 5 cycles of carboplatinum and Abraxane on Apr 13, 2016. She declined further chemotherapy.  Interval history- Patient was seen today by Dr. Theora Gianotti today for follow-up and was found to have  increasing fatigue, weakness, abdominal pain/bloating; nausea; constipation, pelvic pain and decreased appetite. She saw Dr. Grayland Ormond on 05/20/2017, she had a negative exam other than an incisional hernia, and he recommended continued surveillance and repeat imaging.    ECOG FS:1 - Symptomatic but completely ambulatory  Review of systems- Review of Systems  Constitutional: Positive for malaise/fatigue.  HENT: Negative.   Eyes: Negative.   Respiratory: Positive for shortness of breath.   Cardiovascular: Negative.   Gastrointestinal: Positive for abdominal pain, constipation, nausea and vomiting.  Genitourinary: Negative.   Musculoskeletal: Negative.   Skin: Negative.   Neurological: Positive for weakness.  Endo/Heme/Allergies: Negative.   Psychiatric/Behavioral: Negative.      Current treatment-   Allergies  Allergen Reactions  . Clindamycin/Lincomycin Diarrhea and Other (See Comments)    Reaction:  Antibiotic colitis   . Codeine Anxiety     Past Medical History:  Diagnosis Date  . Anxiety   . Cancer ([place])   . GERD (gastroesophageal reflux disease)   . Glaucoma   . Hypercholesteremia   . Hypertension   . Ovarian cancer ([place]) 06/2015   Bilateral Oophorectomies.   Marland Kitchen PONV (postoperative nausea and vomiting)      Past Surgical History:  Procedure Laterality Date  . ABDOMINAL HYSTERECTOMY    . BREAST BIOPSY Right 1970's  . EXPLORATORY LAPAROTOMY W/ BOWEL RESECTION  01/11/2016   XL optimal debulking rectosigmoid resection and reanastomosis  . HAND SURGERY     Right  . LAPAROTOMY Left 07/20/2015   Procedure: LAPAROTOMY with Left Salpingooophrectomy;  Surgeon: Honor Loh  Ward, MD;  Location: [facility] ORS;  Service: Gynecology;  Laterality: Left;  . LAPAROTOMY N/A  07/20/2015   Procedure: LAPAROTOMY;  Surgeon: Gillis Ends, MD;  Location: [facility] ORS;  Service: Gynecology;  Laterality: N/A;  . PERIPHERAL VASCULAR CATHETERIZATION N/A 08/10/2015   Procedure: Glori Luis Cath Insertion;  Surgeon: Algernon Huxley, MD;  Location: [facility];  Service: Cardiovascular;  Laterality: N/A;  . REPLACEMENT TOTAL KNEE     Right  . SALPINGOOPHORECTOMY Right 07/20/2015   Procedure: SALPINGO OOPHORECTOMY;  Surgeon: Gillis Ends, MD;  Location: [facility] ORS;  Service: Gynecology;  Laterality: Right;    Social History   Social History  . Marital status: Widowed    Spouse name: N/A  . Number of children: N/A  . Years of education: N/A   Occupational History  .      Retired   Social History Main Topics  . Smoking status: Never Smoker  . Smokeless tobacco: Never Used  . Alcohol use No  . Drug use: No  . Sexual activity: Not on file   Other Topics Concern  . Not on file   Social History Narrative  . No narrative on file    Family History  Problem Relation Age of Onset  . Stomach cancer Mother   . Osteoporosis Mother   . Breast cancer Neg Hx      Current Outpatient Prescriptions:  .  acetaminophen (TYLENOL) 500 MG tablet, Take 500-1,000 mg by mouth every 6 (six) hours as needed for mild pain, fever or headache. , Disp: , Rfl:  .  bimatoprost (LUMIGAN) 0.01 % SOLN, Place 1 drop into both eyes at bedtime., Disp: , Rfl:  .  fluticasone (FLONASE) 50 MCG/ACT nasal spray, Place 2 sprays into both nostrils daily as needed for rhinitis. , Disp: , Rfl:  .  lidocaine-prilocaine (EMLA) cream, Apply 1 application topically as needed (prior to accessing port)., Disp: , Rfl:  .  LORazepam (ATIVAN) 0.5 MG tablet, Take 0.5 mg by mouth 2 (two) times daily. , Disp: , Rfl:  .  meloxicam (MOBIC) 15 MG tablet, Take 15 mg by mouth daily. , Disp: , Rfl:  .  omeprazole (PRILOSEC) 20 MG capsule, Take 20 mg by mouth daily. , Disp: , Rfl:  .  ondansetron (ZOFRAN) 4  MG tablet, Take 1 tablet (4 mg total) by mouth every 6 (six) hours as needed for nausea., Disp: 30 tablet, Rfl: 3 .  simvastatin (ZOCOR) 20 MG tablet, Take 20 mg by mouth at bedtime. , Disp: , Rfl:  No current facility-administered medications for this visit.   Facility-Administered Medications Ordered in Other Visits:  .  diphenhydrAMINE (BENADRYL) injection 12.5 mg, 12.5 mg, Intravenous, Once, Choksi, Janak, MD .  methylPREDNISolone sodium succinate (SOLU-MEDROL) 125 mg/2 mL injection 100 mg, 100 mg, Intravenous, Once, Choksi, Janak, MD .  sodium chloride flush (NS) 0.9 % injection 10 mL, 10 mL, Intravenous, PRN, Cammie Sickle, MD, 10 mL at 05/20/17 1227  Physical exam: There were no vitals filed for this visit. Physical Exam  Constitutional: She is oriented to person, place, and time and well-developed, well-nourished, and in no distress.  HENT:  Head: Normocephalic and atraumatic.  Right Ear: External ear normal.  Left Ear: External ear normal.  Eyes: Pupils are equal, round, and reactive to light. Conjunctivae and EOM are normal.  Neck: Normal range of motion.  Cardiovascular: Normal rate, regular rhythm, normal heart sounds and intact distal pulses.   Pulmonary/Chest: Effort normal and  breath sounds normal.  Abdominal: She exhibits distension and mass. There is tenderness.  Musculoskeletal: Normal range of motion.  Neurological: She is alert and oriented to person, place, and time. Gait normal.  Skin: Skin is warm and dry.  Psychiatric: Mood, memory, affect and judgment normal.     CMP Latest Ref Rng & Units 05/20/2017  Glucose 65 - 99 mg/dL 135(H)  BUN 6 - 20 mg/dL 14  Creatinine 0.44 - 1.00 mg/dL 0.90  Sodium 135 - 145 mmol/L 133(L)  Potassium 3.5 - 5.1 mmol/L 4.1  Chloride 101 - 111 mmol/L 99(L)  CO2 22 - 32 mmol/L 25  Calcium 8.9 - 10.3 mg/dL 8.9  Total Protein 6.5 - 8.1 g/dL 7.3  Total Bilirubin 0.3 - 1.2 mg/dL 0.7  Alkaline Phos 38 - 126 U/L 100  AST 15 - 41  U/L 39  ALT 14 - 54 U/L 26   CBC Latest Ref Rng & Units 05/20/2017  WBC 3.6 - 11.0 K/uL 9.2  Hemoglobin 12.0 - 16.0 g/dL 11.4(L)  Hematocrit 35.0 - 47.0 % 33.7(L)  Platelets 150 - 440 K/uL 349    No images are attached to the encounter.  No results found.   Assessment and plan- Patient is a 83 y.o. female with many factors that are complicating care; prior abdominal surgery, frailty, declining performance status, rising CA125, symptomatic concern for progressive disease. Exam worrisome for ascites. Failure to thrive. Worsening hernia. Intractable pain. Intractable low grade nausea with limited oral intake, weight stable.   1. Direct Admission to the hospital. Spoke with Dr. Grayland Ormond and he agrees with this plan. Recommend routine labs, CA125 and CT scan. If evidence of ascites she may benefit for a paracentesis.    Visit Diagnosis 1. Lower abdominal pain   2. Intractable vomiting with nausea, unspecified vomiting type   3. Weakness   4. Failure to thrive (0-17)     Patient expressed understanding and was in agreement with this plan. She also understands that She can call clinic at any time with any questions, concerns, or complaints.    Marisue Humble [facility] Pager- [phone] 06/05/2017 3:37 PM

## 2017-06-06 ENCOUNTER — Observation Stay: Payer: Medicare Other

## 2017-06-06 DIAGNOSIS — C569 Malignant neoplasm of unspecified ovary: Secondary | ICD-10-CM | POA: Diagnosis not present

## 2017-06-06 LAB — BASIC METABOLIC PANEL
ANION GAP: 7 (ref 5–15)
BUN: 10 mg/dL (ref 6–20)
CHLORIDE: 101 mmol/L (ref 101–111)
CO2: 27 mmol/L (ref 22–32)
Calcium: 8.5 mg/dL — ABNORMAL LOW (ref 8.9–10.3)
Creatinine, Ser: 0.9 mg/dL (ref 0.44–1.00)
GFR calc Af Amer: >60 mL/min (ref >60)
GFR, EST NON AFRICAN AMERICAN: 56 mL/min — AB (ref >60)
GLUCOSE: 91 mg/dL (ref 65–99)
POTASSIUM: 3.9 mmol/L (ref 3.5–5.1)
SODIUM: 135 mmol/L (ref 135–145)

## 2017-06-06 MED ORDER — IOPAMIDOL (ISOVUE-300) INJECTION 61%
100.0000 mL | Freq: Once | INTRAVENOUS | Status: AC | PRN
  Administered 2017-06-06: 13:00:00 100 mL via INTRAVENOUS

## 2017-06-06 MED ORDER — SODIUM CHLORIDE 0.9% FLUSH: 10.0000 mL | INTRAVENOUS | Status: DC | PRN

## 2017-06-06 MED ORDER — IOPAMIDOL (ISOVUE-300) INJECTION 61%
30.0000 mL | INTRAVENOUS | Status: AC
  Administered 2017-06-06 (×2): 30 mL via ORAL

## 2017-06-06 NOTE — Care Management Obs Status (Signed)
[facility] NOTIFICATION   Patient Details  Name: Kathryn Chandler MRN: [id number] Date of Birth: 04/07/1929   Medicare Observation Status Notification Given:  Yes    Shelbie Ammons, RN 06/06/2017, 10:05 AM

## 2017-06-06 NOTE — Progress Notes (Signed)
PT Cancellation Note  Patient Details Name: Kathryn Chandler MRN: [id number] DOB: 02-25-1929   Cancelled Treatment:    Reason Eval/Treat Not Completed: Patient at procedure or test/unavailable.  Will retry in the AM.   Ramond Dial 06/06/2017, 4:20 PM   Mee Hives, PT MS Acute Rehab Dept. Number: [place] and [place]

## 2017-06-06 NOTE — Care Management Note (Signed)
Case Management Note  Patient Details  Name: Kathryn Chandler MRN: [id number] Date of Birth: 03/03/1929  Subjective/Objective:  Admitted to this facility with the diagnosis of abdominal pain under observation status. Lives alone. Son is [name] [phone]). Last seen Dr. Hall Busing 3 weeks ago. Prescriptions are filled at [facility] or [facility]. [facility] and [facility] in the past.  [facility] 01/2016 and 12/2015. [facility] 12/2015. Port in place. No home oxygen. Bedside commode and rolling walker in the home. Takes care of all basic and instrumental activities of daily living herself, drives, but limited. No falls. Decreased appetite for the last 3-4 weeks. Lost from 139-124, but stable now. Son will transport                  Action/Plan:  Will continue to monitor for discharge needs   Expected Discharge Date:                  Expected Discharge Plan:     In-House Referral:     Discharge planning Services     Post Acute Care Choice:    Choice offered to:     DME Arranged:    DME Agency:     HH Arranged:    HH Agency:     Status of Service:     If discussed at [employer] of Stay Meetings, dates discussed:    Additional Comments:  Shelbie Ammons, RN MSN CCM Care Management [phone] 06/06/2017, 10:05 AM

## 2017-06-06 NOTE — Progress Notes (Signed)
[place] [facility] [place] NAME: Rosalita Carey    MR#:  [id number]  DATE OF BIRTH:  07-15-1929  SUBJECTIVE:   Came in weakness, vomiting and abd distention REVIEW OF SYSTEMS:   Review of Systems  Constitutional: Negative for chills, fever and weight loss.  HENT: Negative for ear discharge, ear pain and nosebleeds.   Eyes: Negative for blurred vision, pain and discharge.  Respiratory: Negative for sputum production, shortness of breath, wheezing and stridor.   Cardiovascular: Negative for chest pain, palpitations, orthopnea and PND.  Gastrointestinal: Positive for abdominal pain. Negative for diarrhea, nausea and vomiting.  Genitourinary: Negative for frequency and urgency.  Musculoskeletal: Negative for back pain and joint pain.  Neurological: Positive for weakness. Negative for sensory change, speech change and focal weakness.  Psychiatric/Behavioral: Negative for depression and hallucinations. The patient is not nervous/anxious.    Tolerating Diet: Tolerating PT:   DRUG ALLERGIES:   Allergies  Allergen Reactions  . Clindamycin/Lincomycin Diarrhea and Other (See Comments)    Reaction:  Antibiotic colitis   . Codeine Anxiety    VITALS:  Blood pressure 122/65, pulse 99, temperature 98.4 F (36.9 C), temperature source Oral, resp. rate 20, height 4\' 11"  (1.499 m), weight 56.1 kg (123 lb 9.6 oz), SpO2 97 %.  PHYSICAL EXAMINATION:   Physical Exam  GENERAL:  83 y.o.-year-old patient lying in the bed with no acute distress.  EYES: Pupils equal, round, reactive to light and accommodation. No scleral icterus. Extraocular muscles intact.  HEENT: Head atraumatic, normocephalic. Oropharynx and nasopharynx clear.  NECK:  Supple, no jugular venous distention. No thyroid enlargement, no tenderness.  LUNGS: Normal breath sounds bilaterally, no wheezing, rales, rhonchi. No use of accessory muscles of respiration.  CARDIOVASCULAR: S1, S2  normal. No murmurs, rubs, or gallops.  ABDOMEN: Soft, nontender. Bowel sounds present. No organomegaly or mass. Epigastric incisional hernia, abd distended no fluid thrill felt EXTREMITIES: No cyanosis, clubbing or edema b/l.    NEUROLOGIC: Cranial nerves II through XII are intact. No focal Motor or sensory deficits b/l.   PSYCHIATRIC:  patient is alert and oriented x 3.  SKIN: No obvious rash, lesion, or ulcer.   LABORATORY PANEL:  CBC  Recent Labs Lab 06/05/17 1917  WBC 9.8  HGB 11.6*  HCT 34.4*  PLT 351    Chemistries   Recent Labs Lab 06/05/17 1917 06/06/17 0445  NA 133* 135  K 3.5 3.9  CL 97* 101  CO2 26 27  GLUCOSE 141* 91  BUN 12 10  CREATININE 0.75 0.90  CALCIUM 8.7* 8.5*  MG 1.6*  --   AST 31  --   ALT 15  --   ALKPHOS 90  --   BILITOT 0.9  --    Cardiac Enzymes No results for input(s): TROPONINI in the last 168 hours. RADIOLOGY:  Dg Abd 1 View  Result Date: 06/06/2017 CLINICAL DATA:  Abdominal pain EXAM: ABDOMEN - 1 VIEW COMPARISON:  Abdominal CT 01/02/2017 FINDINGS: Normal bowel gas pattern. Sigmoidectomy type changes. No concerning mass effect or gas collection. Vascular calcifications are present. There is chronic cardiomegaly. New a hazy density at the left base. IMPRESSION: 1. Normal bowel gas pattern. 2. New opacification at the left base that is partly seen. Recommend chest x-ray correlation. Electronically Signed   By: Monte Fantasia M.D.   On: 06/06/2017 09:15   Ct Abdomen Pelvis W Contrast  Result Date: 06/06/2017 CLINICAL DATA:  Abdominal pain and distention for several  weeks with associated nausea and vomiting. History of ovarian cancer. EXAM: CT ABDOMEN AND PELVIS WITH CONTRAST TECHNIQUE: Multidetector CT imaging of the abdomen and pelvis was performed using the standard protocol following bolus administration of intravenous contrast. CONTRAST:  111mL ISOVUE-300 IOPAMIDOL (ISOVUE-300) INJECTION 61% COMPARISON:  Abdominal x-ray from same day. CT  chest, abdomen, and pelvis dated January 02, 2017. FINDINGS: Lower chest: New moderate left and small right pleural effusions with adjacent atelectasis. Trace pericardial effusion. Hepatobiliary: Subcentimeter low-density lesion in segment 6 is too small to characterize, but was not definitively seen on prior CT. Cholelithiasis. No biliary dilatation. Pancreas: Negative. Spleen: Normal in size without focal abnormality. Adrenals/Urinary Tract: New mild right renal pelvic fullness. Unchanged right inferior pole parapelvic cyst and scattered subcentimeter low density lesions which are too small to characterize. No hydronephrosis. The bladder is unremarkable. The adrenal glands are normal. Stomach/Bowel: Small hiatal hernia. There is a 2.4 x 1.7 cm low-density nodule which appears associated with the gastric wall near the pylorus (image 33, series 2), new from prior study. No obstruction. Prior rectosigmoid colonic resection with anastomosis. New wall thickening of the residual rectum with infiltration of the presacral fat. Vascular/Lymphatic: Aortoiliac atherosclerotic vascular disease. No lymphadenopathy. Reproductive: Status post hysterectomy and bilateral salpingo-oophorectomy. Other: New moderate to large ascites with diffuse thickening, enhancement, and nodularity of the peritoneum. Ascites extends into the lower posterior mediastinum. New scattered peritoneal implants, for instance in the pelvis (image 66, series 2) and left upper quadrant (image 25, series 2). Midline supraumbilical abdominal wall hernia containing ascites. Small bilateral fat containing hernias again noted. Musculoskeletal: No acute or significant osseous findings. Degenerative changes of the lumbar spine, similar to prior study. IMPRESSION: 1. New moderate to large ascites with diffuse thickening, enhancement, and nodularity of the peritoneum, consistent with worsening metastatic disease given history of ovarian cancer. 2. New low-density  2.4 cm nodule which appears associated with the gastric wall near the pylorus, possibly representing a serosal implant. 3. Status post prior rectosigmoid colonic resection with primary reanastomosis, with new wall thickening of the residual rectum and infiltration of the presacral fat, which may reflect proctitis. 4. New moderate left and small right pleural effusions, incompletely visualized. Electronically Signed   By: Titus Dubin M.D.   On: 06/06/2017 14:36   ASSESSMENT AND PLAN:  Daylin Eads  is a 83 y.o. female with a known history of Stage III Ovarian cancer, hypertension and anxiety. The patient was sent for direct admission today due to above chief complaints. She said that she has had abdominal pain for several weeks, worsening and distended recently. She has had intractable nausea and vomiting in the cancer center office.  1. Abdominal distention with nausea and vomitng -CT abdomen showed large ascites -for US guided paracentesis today -d/w dr Grayland Ormond and pt's son---agreeable  2.Porgressive Ovarian cancer per CT imaging -pt has decline further chemo per clinic notes -labs overall ok  3/weakness  PT to see  D/w son overall long term poor prognosis  Case discussed with Care Management/Social Worker. Management plans discussed with the patient, family and they are in agreement.  CODE STATUS: full  DVT Prophylaxis: lovenox TOTAL TIME TAKING CARE OF THIS PATIENT 30* minutes.  >50% time spent on counselling and coordination of care  POSSIBLE D/C IN *1-2* DAYS, DEPENDING ON CLINICAL CONDITION.  Note: This dictation was prepared with Dragon dictation along with smaller phrase technology. Any transcriptional errors that result from this process are unintentional.  Larisa Lanius M.D on 06/06/2017 at 5:01  PM  Between 7am to 6pm - Pager - [phone]  After 6pm go to [email or web address] - password [email or web address] [facility]  Office  [phone]  CC: Primary care  physician; Albina Billet, MD

## 2017-06-07 DIAGNOSIS — C569 Malignant neoplasm of unspecified ovary: Secondary | ICD-10-CM | POA: Diagnosis not present

## 2017-06-07 LAB — URINALYSIS, ROUTINE W REFLEX MICROSCOPIC
Bilirubin Urine: NEGATIVE
Glucose, UA: NEGATIVE mg/dL
Hgb urine dipstick: NEGATIVE
Ketones, ur: 5 mg/dL — AB
LEUKOCYTES UA: NEGATIVE
NITRITE: NEGATIVE
Protein, ur: NEGATIVE mg/dL
SPECIFIC GRAVITY, URINE: 1.014 (ref 1.005–1.030)
pH: 5 (ref 5.0–8.0)

## 2017-06-07 MED ORDER — HEPARIN SOD (PORK) LOCK FLUSH 100 UNIT/ML IV SOLN
500.0000 [IU] | Freq: Once | INTRAVENOUS | Status: DC
  Filled 2017-06-07: qty 5

## 2017-06-07 MED ORDER — HEPARIN SOD (PORK) LOCK FLUSH 10 UNIT/ML IV SOLN
10.0000 [IU] | Freq: Once | INTRAVENOUS | Status: DC
  Filled 2017-06-07: qty 1

## 2017-06-07 NOTE — Progress Notes (Signed)
Pt is being discharged today. Discharge instructions reviewed with the patient, she verified understanding. 0 paper prescriptions given to her. Her prot was flushed and  Deaccessed. All belongings packed and returned to her. She is currently waiting on her son to arrive to transport her home. She will be wheeled out in a wheelchair by staff.

## 2017-06-07 NOTE — Progress Notes (Signed)
[place] [facility] [place] NAME: Kathryn Chandler    MR#:  [id number]  DATE OF BIRTH:  1929-10-21  DATE OF ADMISSION:  06/05/2017 ADMITTING PHYSICIAN: Demetrios Loll, MD  DATE OF DISCHARGE: 06/07/17  PRIMARY CARE PHYSICIAN: Albina Billet, MD    ADMISSION DIAGNOSIS:  failure to thrieve  DISCHARGE DIAGNOSIS:  Ascites status post paracentesis 3 L removed  Malignant ovarian cancer.  Generalized weakness.  SECONDARY DIAGNOSIS:   Past Medical History:  Diagnosis Date  . Anxiety   . Cancer ([place])   . GERD (gastroesophageal reflux disease)   . Glaucoma   . Hypercholesteremia   . Hypertension   . Ovarian cancer ([place]) 06/2015   Bilateral Oophorectomies.   Marland Kitchen PONV (postoperative nausea and vomiting)     HOSPITAL COURSE:  [name], hypertension and anxiety. The patient was sent for direct admission today due to above chief complaints. She said that she has had abdominal pain for several weeks, worsening and distended recently. She has had intractable nausea and vomiting in the cancer center office.  1. Abdominal distention with nausea and vomitng -CT abdomen showed large ascites s/p US guided paracentesis with removal of 2.9 L of fluid. Patient reports feeling a lot better. -d/w dr Grayland Ormond and pt's son---agreeable  2.Progressive Ovarian cancer per CT imaging -pt has decline further chemo per clinic notes -labs overall ok  3/weakness  PT to see her prior to discharge.  D/w son overall long term poor prognosis.  Patient was discharged after seen by physical therapy. Patient agreeable.  CONSULTS OBTAINED:    DRUG ALLERGIES:   Allergies  Allergen Reactions  . Clindamycin/Lincomycin Diarrhea and Other (See Comments)    Reaction:  Antibiotic colitis   . Codeine Anxiety    DISCHARGE MEDICATIONS:   Current Discharge Medication List    CONTINUE these  medications which have NOT CHANGED   Details  LORazepam (ATIVAN) 0.5 MG tablet Take 0.5 mg by mouth 2 (two) times daily.     meloxicam (MOBIC) 15 MG tablet Take 15 mg by mouth daily.     omeprazole (PRILOSEC) 20 MG capsule Take 20 mg by mouth daily.     ondansetron (ZOFRAN) 4 MG tablet Take 1 tablet (4 mg total) by mouth every 6 (six) hours as needed for nausea. Qty: 30 tablet, Refills: 3   Associated Diagnoses: Ovarian cancer, unspecified laterality (HCC)    simvastatin (ZOCOR) 20 MG tablet Take 20 mg by mouth at bedtime.     acetaminophen (TYLENOL) 500 MG tablet Take 500-1,000 mg by mouth every 6 (six) hours as needed for mild pain, fever or headache.     bimatoprost (LUMIGAN) 0.01 % SOLN Place 1 drop into both eyes at bedtime.    fluticasone (FLONASE) 50 MCG/ACT nasal spray Place 2 sprays into both nostrils daily as needed for rhinitis.     lidocaine-prilocaine (EMLA) cream Apply 1 application topically as needed (prior to accessing port).        If you experience worsening of your admission symptoms, develop shortness of breath, life threatening emergency, suicidal or homicidal thoughts you must seek medical attention immediately by calling 911 or calling your MD immediately  if symptoms less severe.  You Must read complete instructions/literature along with all the possible adverse reactions/side effects for all the Medicines you take and that have been prescribed to you. Take any new Medicines after you have completely understood and accept  all the possible adverse reactions/side effects.   Please note  You were cared for by a hospitalist during your hospital stay. If you have any questions about your discharge medications or the care you received while you were in the hospital after you are discharged, you can call the unit and asked to speak with the hospitalist on call if the hospitalist that took care of you is not available. Once you are discharged, your primary care  physician will handle any further medical issues. Please note that NO REFILLS for any discharge medications will be authorized once you are discharged, as it is imperative that you return to your primary care physician (or establish a relationship with a primary care physician if you do not have one) for your aftercare needs so that they can reassess your need for medications and monitor your lab values. Today   SUBJECTIVE    Feel a lot better after the fluid removal.  VITAL SIGNS:  Blood pressure (!) 109/54, pulse 96, temperature 99.2 F (37.3 C), resp. rate 18, height 4\' 11"  (1.499 m), weight 56.1 kg (123 lb 9.6 oz), SpO2 94 %.  I/O:   Intake/Output Summary (Last 24 hours) at 06/07/17 0744 Last data filed at 06/07/17 0532  Gross per 24 hour  Intake                0 ml  Output              200 ml  Net             -200 ml    PHYSICAL EXAMINATION:  GENERAL:  83 y.o.-year-old patient lying in the bed with no acute distress.  EYES: Pupils equal, round, reactive to light and accommodation. No scleral icterus. Extraocular muscles intact.  HEENT: Head atraumatic, normocephalic. Oropharynx and nasopharynx clear.  NECK:  Supple, no jugular venous distention. No thyroid enlargement, no tenderness.  LUNGS: Normal breath sounds bilaterally, no wheezing, rales,rhonchi or crepitation. No use of accessory muscles of respiration.  CARDIOVASCULAR: S1, S2 normal. No murmurs, rubs, or gallops.  ABDOMEN: Soft, non-tender, non-distended. Bowel sounds present. No organomegaly or mass.  EXTREMITIES: No pedal edema, cyanosis, or clubbing.  NEUROLOGIC: Cranial nerves II through XII are intact. Muscle strength 5/5 in all extremities. Sensation intact. Gait not checked.  PSYCHIATRIC: The patient is alert and oriented x 3.  SKIN: No obvious rash, lesion, or ulcer.   DATA REVIEW:   CBC   Recent Labs Lab 06/05/17 1917  WBC 9.8  HGB 11.6*  HCT 34.4*  PLT 351    Chemistries   Recent Labs Lab  06/05/17 1917 06/06/17 0445  NA 133* 135  K 3.5 3.9  CL 97* 101  CO2 26 27  GLUCOSE 141* 91  BUN 12 10  CREATININE 0.75 0.90  CALCIUM 8.7* 8.5*  MG 1.6*  --   AST 31  --   ALT 15  --   ALKPHOS 90  --   BILITOT 0.9  --     Microbiology Results   No results found for this or any previous visit (from the past 240 hour(s)).  RADIOLOGY:  Dg Abd 1 View  Result Date: 06/06/2017 CLINICAL DATA:  Abdominal pain EXAM: ABDOMEN - 1 VIEW COMPARISON:  Abdominal CT 01/02/2017 FINDINGS: Normal bowel gas pattern. Sigmoidectomy type changes. No concerning mass effect or gas collection. Vascular calcifications are present. There is chronic cardiomegaly. New a hazy density at the left base. IMPRESSION: 1. Normal bowel gas pattern. 2.  New opacification at the left base that is partly seen. Recommend chest x-ray correlation. Electronically Signed   By: Monte Fantasia M.D.   On: 06/06/2017 09:15   Ct Abdomen Pelvis W Contrast  Result Date: 06/06/2017 CLINICAL DATA:  Abdominal pain and distention for several weeks with associated nausea and vomiting. History of ovarian cancer. EXAM: CT ABDOMEN AND PELVIS WITH CONTRAST TECHNIQUE: Multidetector CT imaging of the abdomen and pelvis was performed using the standard protocol following bolus administration of intravenous contrast. CONTRAST:  158mL ISOVUE-300 IOPAMIDOL (ISOVUE-300) INJECTION 61% COMPARISON:  Abdominal x-ray from same day. CT chest, abdomen, and pelvis dated January 02, 2017. FINDINGS: Lower chest: New moderate left and small right pleural effusions with adjacent atelectasis. Trace pericardial effusion. Hepatobiliary: Subcentimeter low-density lesion in segment 6 is too small to characterize, but was not definitively seen on prior CT. Cholelithiasis. No biliary dilatation. Pancreas: Negative. Spleen: Normal in size without focal abnormality. Adrenals/Urinary Tract: New mild right renal pelvic fullness. Unchanged right inferior pole parapelvic cyst and  scattered subcentimeter low density lesions which are too small to characterize. No hydronephrosis. The bladder is unremarkable. The adrenal glands are normal. Stomach/Bowel: Small hiatal hernia. There is a 2.4 x 1.7 cm low-density nodule which appears associated with the gastric wall near the pylorus (image 33, series 2), new from prior study. No obstruction. Prior rectosigmoid colonic resection with anastomosis. New wall thickening of the residual rectum with infiltration of the presacral fat. Vascular/Lymphatic: Aortoiliac atherosclerotic vascular disease. No lymphadenopathy. Reproductive: Status post hysterectomy and bilateral salpingo-oophorectomy. Other: New moderate to large ascites with diffuse thickening, enhancement, and nodularity of the peritoneum. Ascites extends into the lower posterior mediastinum. New scattered peritoneal implants, for instance in the pelvis (image 66, series 2) and left upper quadrant (image 25, series 2). Midline supraumbilical abdominal wall hernia containing ascites. Small bilateral fat containing hernias again noted. Musculoskeletal: No acute or significant osseous findings. Degenerative changes of the lumbar spine, similar to prior study. IMPRESSION: 1. New moderate to large ascites with diffuse thickening, enhancement, and nodularity of the peritoneum, consistent with worsening metastatic disease given history of ovarian cancer. 2. New low-density 2.4 cm nodule which appears associated with the gastric wall near the pylorus, possibly representing a serosal implant. 3. Status post prior rectosigmoid colonic resection with primary reanastomosis, with new wall thickening of the residual rectum and infiltration of the presacral fat, which may reflect proctitis. 4. New moderate left and small right pleural effusions, incompletely visualized. Electronically Signed   By: Titus Dubin M.D.   On: 06/06/2017 14:36   US Paracentesis  Result Date: 06/06/2017 INDICATION: 83 year old  female, ovarian cancer and malignant ascites. EXAM: ULTRASOUND GUIDED  PARACENTESIS MEDICATIONS: None. COMPLICATIONS: None immediate. PROCEDURE: Informed written consent was obtained from the patient after a discussion of the risks, benefits and alternatives to treatment. A timeout was performed prior to the initiation of the procedure. Initial ultrasound scanning demonstrates a large amount of ascites within the right lower abdominal quadrant. The right lower abdomen was prepped and draped in the usual sterile fashion. 1% lidocaine with epinephrine was used for local anesthesia. Following this, a 6 Fr Safe-T-Centesis catheter was introduced. An ultrasound image was saved for documentation purposes. The paracentesis was performed. The catheter was removed and a dressing was applied. The patient tolerated the procedure well without immediate post procedural complication. FINDINGS: A total of approximately 2900 mL of golden ascitic fluid was removed. Samples were sent to the laboratory as requested by the clinical team. IMPRESSION:  Successful ultrasound-guided paracentesis yielding 2.9 liters of peritoneal fluid. Electronically Signed   By: Jacqulynn Cadet M.D.   On: 06/06/2017 17:14     Management plans discussed with the patient, family and they are in agreement.  CODE STATUS:     Code Status Orders        Start     Ordered   06/05/17 1722  Full code  Continuous     06/05/17 1721    Code Status History    Date Active Date Inactive Code Status Order ID Comments User Context   04/17/2016  6:06 PM 04/20/2016  6:16 PM Full Code [id number]  Fritzi Mandes, MD Inpatient   11/04/2015  6:05 PM 11/05/2015  7:27 PM Full Code [id number]  Clayburn Pert, MD Inpatient   07/20/2015 12:09 PM 07/24/2015  6:47 PM Full Code [id number]  Gillis Ends, MD Inpatient    Advance Directive Documentation     Most Recent Value  Type of Advance Directive  Healthcare Power of [place], Living will  Pre-existing out  of facility DNR order (yellow form or pink MOST form)  -  "MOST" Form in Place?  -      TOTAL TIME TAKING CARE OF THIS PATIENT: 40 minutes.    Indigo Barbian M.D on 06/07/2017 at 7:44 AM  Between 7am to 6pm - Pager - [phone] After 6pm go to [email or web address] - password [email or web address] [facility]  Office  [phone]  CC: Primary care physician; Albina Billet, MD

## 2017-06-20 NOTE — Discharge Summary (Signed)
[place] [facility] [place] NAME: Tiffany Calmes    MR#:  [id number]  DATE OF BIRTH:  07/05/1929  DATE OF ADMISSION:  06/05/2017    ADMITTING PHYSICIAN: Demetrios Loll, MD  DATE OF DISCHARGE: 06/07/17  PRIMARY CARE PHYSICIAN: Albina Billet, MD    ADMISSION DIAGNOSIS:  failure to thrieve  DISCHARGE DIAGNOSIS:  Ascites status post paracentesis 3 L removed  Malignant ovarian cancer.  Generalized weakness.  SECONDARY DIAGNOSIS:       Past Medical History:  Diagnosis Date  . Anxiety   . Cancer ([place])   . GERD (gastroesophageal reflux disease)   . Glaucoma   . Hypercholesteremia   . Hypertension   . Ovarian cancer ([place]) 06/2015   Bilateral Oophorectomies.   Marland Kitchen PONV (postoperative nausea and vomiting)     HOSPITAL COURSE:  [name], hypertension and anxiety. The patient was sent for direct admission today due to above chief complaints. She said that she has had abdominal pain for several weeks, worsening and distended recently. She has had intractable nausea and vomiting in the cancer center office.  1. Abdominal distention with nausea and vomitng -CT abdomen showed large ascites s/p US guided paracentesis with removal of 2.9 L of fluid. Patient reports feeling a lot better. -d/w dr Grayland Ormond and pt's son---agreeable  2.Progressive Ovarian cancer per CT imaging -pt has decline further chemo per clinic notes -labs overall ok  3/weakness  PT to see her prior to discharge.  D/w son overall long term poor prognosis.  Patient was discharged after seen by physical therapy. Patient agreeable.  CONSULTS OBTAINED:   DRUG ALLERGIES:        Allergies  Allergen Reactions  . Clindamycin/Lincomycin Diarrhea and Other (See Comments)    Reaction:  Antibiotic colitis   . Codeine Anxiety    DISCHARGE MEDICATIONS:       Current  Discharge Medication List        CONTINUE these medications which have NOT CHANGED   Details  LORazepam (ATIVAN) 0.5 MG tablet Take 0.5 mg by mouth 2 (two) times daily.     meloxicam (MOBIC) 15 MG tablet Take 15 mg by mouth daily.     omeprazole (PRILOSEC) 20 MG capsule Take 20 mg by mouth daily.     ondansetron (ZOFRAN) 4 MG tablet Take 1 tablet (4 mg total) by mouth every 6 (six) hours as needed for nausea. Qty: 30 tablet, Refills: 3   Associated Diagnoses: Ovarian cancer, unspecified laterality (HCC)    simvastatin (ZOCOR) 20 MG tablet Take 20 mg by mouth at bedtime.     acetaminophen (TYLENOL) 500 MG tablet Take 500-1,000 mg by mouth every 6 (six) hours as needed for mild pain, fever or headache.     bimatoprost (LUMIGAN) 0.01 % SOLN Place 1 drop into both eyes at bedtime.    fluticasone (FLONASE) 50 MCG/ACT nasal spray Place 2 sprays into both nostrils daily as needed for rhinitis.     lidocaine-prilocaine (EMLA) cream Apply 1 application topically as needed (prior to accessing port).        If you experience worsening of your admission symptoms, develop shortness of breath, life threatening emergency, suicidal or homicidal thoughts you must seek medical attention immediately by calling 911 or calling your MD immediately  if symptoms less severe.  You Must read complete instructions/literature along with all the possible adverse reactions/side effects for all the Medicines you  take and that have been prescribed to you. Take any new Medicines after you have completely understood and accept all the possible adverse reactions/side effects.   Please note  You were cared for by a hospitalist during your hospital stay. If you have any questions about your discharge medications or the care you received while you were in the hospital after you are discharged, you can call the unit and asked to speak with the hospitalist on call if the hospitalist that took care of you  is not available. Once you are discharged, your primary care physician will handle any further medical issues. Please note that NO REFILLS for any discharge medications will be authorized once you are discharged, as it is imperative that you return to your primary care physician (or establish a relationship with a primary care physician if you do not have one) for your aftercare needs so that they can reassess your need for medications and monitor your lab values. Today   SUBJECTIVE    Feel a lot better after the fluid removal.  VITAL SIGNS:  Blood pressure (!) 109/54, pulse 96, temperature 99.2 F (37.3 C), resp. rate 18, height 4\' 11"  (1.499 m), weight 56.1 kg (123 lb 9.6 oz), SpO2 94 %.  I/O:   Intake/Output Summary (Last 24 hours) at 06/07/17 0744 Last data filed at 06/07/17 0532  Gross per 24 hour  Intake                0 ml  Output              200 ml  Net             -200 ml    PHYSICAL EXAMINATION:  GENERAL:  83 y.o.-year-old patient lying in the bed with no acute distress.  EYES: Pupils equal, round, reactive to light and accommodation. No scleral icterus. Extraocular muscles intact.  HEENT: Head atraumatic, normocephalic. Oropharynx and nasopharynx clear.  NECK:  Supple, no jugular venous distention. No thyroid enlargement, no tenderness.  LUNGS: Normal breath sounds bilaterally, no wheezing, rales,rhonchi or crepitation. No use of accessory muscles of respiration.  CARDIOVASCULAR: S1, S2 normal. No murmurs, rubs, or gallops.  ABDOMEN: Soft, non-tender, non-distended. Bowel sounds present. No organomegaly or mass.  EXTREMITIES: No pedal edema, cyanosis, or clubbing.  NEUROLOGIC: Cranial nerves II through XII are intact. Muscle strength 5/5 in all extremities. Sensation intact. Gait not checked.  PSYCHIATRIC: The patient is alert and oriented x 3.  SKIN: No obvious rash, lesion, or ulcer.   DATA REVIEW:   CBC   Last Labs    Recent Labs Lab 06/05/17 1917   WBC 9.8  HGB 11.6*  HCT 34.4*  PLT 351      Chemistries   Last Labs    Recent Labs Lab 06/05/17 1917 06/06/17 0445  NA 133* 135  K 3.5 3.9  CL 97* 101  CO2 26 27  GLUCOSE 141* 91  BUN 12 10  CREATININE 0.75 0.90  CALCIUM 8.7* 8.5*  MG 1.6*  --   AST 31  --   ALT 15  --   ALKPHOS 90  --   BILITOT 0.9  --       Microbiology Results   No results found for this or any previous visit (from the past 240 hour(s)).  RADIOLOGY:   Imaging Results (Last 48 hours)  Dg Abd 1 View  Result Date: 06/06/2017 CLINICAL DATA:  Abdominal pain EXAM: ABDOMEN - 1 VIEW COMPARISON:  Abdominal  CT 01/02/2017 FINDINGS: Normal bowel gas pattern. Sigmoidectomy type changes. No concerning mass effect or gas collection. Vascular calcifications are present. There is chronic cardiomegaly. New a hazy density at the left base. IMPRESSION: 1. Normal bowel gas pattern. 2. New opacification at the left base that is partly seen. Recommend chest x-ray correlation. Electronically Signed   By: Monte Fantasia M.D.   On: 06/06/2017 09:15   Ct Abdomen Pelvis W Contrast  Result Date: 06/06/2017 CLINICAL DATA:  Abdominal pain and distention for several weeks with associated nausea and vomiting. History of ovarian cancer. EXAM: CT ABDOMEN AND PELVIS WITH CONTRAST TECHNIQUE: Multidetector CT imaging of the abdomen and pelvis was performed using the standard protocol following bolus administration of intravenous contrast. CONTRAST:  119mL ISOVUE-300 IOPAMIDOL (ISOVUE-300) INJECTION 61% COMPARISON:  Abdominal x-ray from same day. CT chest, abdomen, and pelvis dated January 02, 2017. FINDINGS: Lower chest: New moderate left and small right pleural effusions with adjacent atelectasis. Trace pericardial effusion. Hepatobiliary: Subcentimeter low-density lesion in segment 6 is too small to characterize, but was not definitively seen on prior CT. Cholelithiasis. No biliary dilatation. Pancreas: Negative. Spleen:  Normal in size without focal abnormality. Adrenals/Urinary Tract: New mild right renal pelvic fullness. Unchanged right inferior pole parapelvic cyst and scattered subcentimeter low density lesions which are too small to characterize. No hydronephrosis. The bladder is unremarkable. The adrenal glands are normal. Stomach/Bowel: Small hiatal hernia. There is a 2.4 x 1.7 cm low-density nodule which appears associated with the gastric wall near the pylorus (image 33, series 2), new from prior study. No obstruction. Prior rectosigmoid colonic resection with anastomosis. New wall thickening of the residual rectum with infiltration of the presacral fat. Vascular/Lymphatic: Aortoiliac atherosclerotic vascular disease. No lymphadenopathy. Reproductive: Status post hysterectomy and bilateral salpingo-oophorectomy. Other: New moderate to large ascites with diffuse thickening, enhancement, and nodularity of the peritoneum. Ascites extends into the lower posterior mediastinum. New scattered peritoneal implants, for instance in the pelvis (image 66, series 2) and left upper quadrant (image 25, series 2). Midline supraumbilical abdominal wall hernia containing ascites. Small bilateral fat containing hernias again noted. Musculoskeletal: No acute or significant osseous findings. Degenerative changes of the lumbar spine, similar to prior study. IMPRESSION: 1. New moderate to large ascites with diffuse thickening, enhancement, and nodularity of the peritoneum, consistent with worsening metastatic disease given history of ovarian cancer. 2. New low-density 2.4 cm nodule which appears associated with the gastric wall near the pylorus, possibly representing a serosal implant. 3. Status post prior rectosigmoid colonic resection with primary reanastomosis, with new wall thickening of the residual rectum and infiltration of the presacral fat, which may reflect proctitis. 4. New moderate left and small right pleural effusions, incompletely  visualized. Electronically Signed   By: Titus Dubin M.D.   On: 06/06/2017 14:36   US Paracentesis  Result Date: 06/06/2017 INDICATION: 83 year old female, ovarian cancer and malignant ascites. EXAM: ULTRASOUND GUIDED  PARACENTESIS MEDICATIONS: None. COMPLICATIONS: None immediate. PROCEDURE: Informed written consent was obtained from the patient after a discussion of the risks, benefits and alternatives to treatment. A timeout was performed prior to the initiation of the procedure. Initial ultrasound scanning demonstrates a large amount of ascites within the right lower abdominal quadrant. The right lower abdomen was prepped and draped in the usual sterile fashion. 1% lidocaine with epinephrine was used for local anesthesia. Following this, a 6 Fr Safe-T-Centesis catheter was introduced. An ultrasound image was saved for documentation purposes. The paracentesis was performed. The catheter was removed and a  dressing was applied. The patient tolerated the procedure well without immediate post procedural complication. FINDINGS: A total of approximately 2900 mL of golden ascitic fluid was removed. Samples were sent to the laboratory as requested by the clinical team. IMPRESSION: Successful ultrasound-guided paracentesis yielding 2.9 liters of peritoneal fluid. Electronically Signed   By: Jacqulynn Cadet M.D.   On: 06/06/2017 17:14      Management plans discussed with the patient, family and they are in agreement.  CODE STATUS:             Code Status Orders              Start     Ordered   06/05/17 1722  Full code  Continuous     06/05/17 1721                      Code Status History    Date Active Date Inactive Code Status Order ID Comments User Context   04/17/2016  6:06 PM 04/20/2016  6:16 PM Full Code [id number]  Fritzi Mandes, MD Inpatient   11/04/2015  6:05 PM 11/05/2015  7:27 PM Full Code [id number]  Clayburn Pert, MD Inpatient   07/20/2015 12:09 PM 07/24/2015  6:47  PM Full Code [id number]  Gillis Ends, MD Inpatient          Advance Directive Documentation     Most Recent Value  Type of Advance Directive  Healthcare Power of [place], Living will  Pre-existing out of facility DNR order (yellow form or pink MOST form)  -  "MOST" Form in Place?  -      TOTAL TIME TAKING CARE OF THIS PATIENT: 40 minutes.    Bowie Delia M.D on 06/07/2017 at 7:44 AM  Between 7am to 6pm - Pager - [phone] After 6pm go to [email or web address] - password [email or web address] [facility]  Office  [phone]  CC: Primary care physician; Albina Billet, MD

## 2017-06-26 ENCOUNTER — Inpatient Hospital Stay: Payer: Medicare Other | Attending: Obstetrics and Gynecology | Admitting: Obstetrics and Gynecology

## 2017-06-26 ENCOUNTER — Inpatient Hospital Stay (HOSPITAL_BASED_OUTPATIENT_CLINIC_OR_DEPARTMENT_OTHER): Payer: Medicare Other | Admitting: Oncology

## 2017-06-26 VITALS — BP 122/79 | HR 103 | Temp 97.8°F | Resp 18 | Ht 59.0 in | Wt 116.4 lb

## 2017-06-26 DIAGNOSIS — Z85820 Personal history of malignant melanoma of skin: Secondary | ICD-10-CM

## 2017-06-26 DIAGNOSIS — J90 Pleural effusion, not elsewhere classified: Secondary | ICD-10-CM

## 2017-06-26 DIAGNOSIS — Z800 Family history of malignant neoplasm of digestive organs: Secondary | ICD-10-CM

## 2017-06-26 DIAGNOSIS — K219 Gastro-esophageal reflux disease without esophagitis: Secondary | ICD-10-CM

## 2017-06-26 DIAGNOSIS — R630 Anorexia: Secondary | ICD-10-CM | POA: Insufficient documentation

## 2017-06-26 DIAGNOSIS — Z79899 Other long term (current) drug therapy: Secondary | ICD-10-CM | POA: Insufficient documentation

## 2017-06-26 DIAGNOSIS — C569 Malignant neoplasm of unspecified ovary: Secondary | ICD-10-CM | POA: Diagnosis present

## 2017-06-26 DIAGNOSIS — Z5111 Encounter for antineoplastic chemotherapy: Secondary | ICD-10-CM | POA: Diagnosis not present

## 2017-06-26 DIAGNOSIS — I10 Essential (primary) hypertension: Secondary | ICD-10-CM

## 2017-06-26 DIAGNOSIS — I700 Atherosclerosis of aorta: Secondary | ICD-10-CM | POA: Diagnosis not present

## 2017-06-26 DIAGNOSIS — Z8543 Personal history of malignant neoplasm of ovary: Secondary | ICD-10-CM

## 2017-06-26 DIAGNOSIS — E7800 Pure hypercholesterolemia, unspecified: Secondary | ICD-10-CM | POA: Insufficient documentation

## 2017-06-26 DIAGNOSIS — R188 Other ascites: Secondary | ICD-10-CM | POA: Diagnosis not present

## 2017-06-26 DIAGNOSIS — I517 Cardiomegaly: Secondary | ICD-10-CM

## 2017-06-26 DIAGNOSIS — R180 Malignant ascites: Secondary | ICD-10-CM | POA: Diagnosis not present

## 2017-06-26 DIAGNOSIS — R1904 Left lower quadrant abdominal swelling, mass and lump: Secondary | ICD-10-CM | POA: Diagnosis not present

## 2017-06-26 DIAGNOSIS — I2510 Atherosclerotic heart disease of native coronary artery without angina pectoris: Secondary | ICD-10-CM

## 2017-06-26 DIAGNOSIS — R5383 Other fatigue: Secondary | ICD-10-CM | POA: Diagnosis not present

## 2017-06-26 DIAGNOSIS — R110 Nausea: Secondary | ICD-10-CM | POA: Insufficient documentation

## 2017-06-26 DIAGNOSIS — K8020 Calculus of gallbladder without cholecystitis without obstruction: Secondary | ICD-10-CM | POA: Insufficient documentation

## 2017-06-26 DIAGNOSIS — C786 Secondary malignant neoplasm of retroperitoneum and peritoneum: Secondary | ICD-10-CM | POA: Insufficient documentation

## 2017-06-26 DIAGNOSIS — F419 Anxiety disorder, unspecified: Secondary | ICD-10-CM | POA: Diagnosis not present

## 2017-06-26 DIAGNOSIS — R531 Weakness: Secondary | ICD-10-CM | POA: Insufficient documentation

## 2017-06-26 NOTE — Progress Notes (Addendum)
Gynecologic Oncology Interval Visit   Referring Provider: Dr. Hall Busing  Chief Concern: recurrent stage IIIC ovarian cancer, history of rectovaginal fistula s/p rectosigmoid resection and reanastomosis with optimal interval tumor debulking and chemotherapy.   Subjective:  Kathryn Chandler is a 83 y.o. female who is seen initially in consultation from Dr. Hall Busing with stage IIIC HGSC of the ovary.  She was last seen in clinic on 06/05/2017 with symptoms concerning for recurrent disease. On CT scan she had CT abdomen showed large ascites.  IMPRESSION: 1. New moderate to large ascites with diffuse thickening, enhancement, and nodularity of the peritoneum, consistent with worsening metastatic disease given history of ovarian cancer. 2. New low-density 2.4 cm nodule which appears associated with the gastric wall near the pylorus, possibly representing a serosal implant. 3. Status post prior rectosigmoid colonic resection with primary reanastomosis, with new wall thickening of the residual rectum and infiltration of the presacral fat, which may reflect proctitis. 4. New moderate left and small right pleural effusions, incompletely Visualized.  She underwent US guided paracentesis with removal of 2.9 L of fluid and her symptoms improved.   She presents today to discuss management options.       Lab Results  Component Value Date   CA125 174.9 (H) 05/20/2017   CA125 191.4 (H) 04/17/2017   CA125 171.8 (H) 04/02/2017    Gynecologic Oncology History   Kathryn Chandler is a pleasant female who is seen initially in consultation from Dr. Hall Busing with suboptimally debulked stage IIIC HGSC. She underwent laparotomy and LSO with resection of pelvic mass on 07/20/2015 and has stage IIIC ovarian cancer. Her disease was not amendable to debulking to no gross residual. She would have required several bowel resections, diaphragm stripping, and liver mobilization and due to peritoneal carcinomatosis would not have  been debulked to R0. Preoperative CA125 74.2 and HE4=118  FINAL PATHOLOGY DIAGNOSIS:  A. LEFT OVARY; OOPHORECTOMY:  - HIGH-GRADE SEROUS CARCINOMA.  - FALLOPIAN TUBE NOT IDENTIFIED.  She has been treated with weekly taxane and carboplatin (initially paclitaxel but was switched to Abraxane on day 1 and 8 and 15). Bevacizumab was added cycles 2 and 3. She is tolerating her therapy with an excellent decrease in CA125 values. 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 She  status for status post XL on 01/11/2016 with RSO, enterolysis, infragastric omentectomy, mobilization of splenic flexure,  resection of rectosigmoid and repair of fistula, with optimal debulking to < 3 mm. Residual disease is miliary in nature involving small bowel mesentary. Pathology results c/w high grade serous adenocarcinoma except rectosigmoid colon, excision which revealed benign colonic tissue with diverticula.  She completed 5 cycles of carboplatinum and Abraxane on Apr 13, 2016. She declined further chemotherapy.   Rising CA125 in May 2017 confirmed x 2.     04/17/2016 CT scan A/P IMPRESSION: 1. Pancolitis c/w C difficile. 2. Ovarian cancer with omental metastases noted previously not clearly seen today. 3. Chronic findings are described above.  Given asymptomatic state plan for continued surveillance.   01/02/2017 CT scan C/A/P  1. No definitive evidence of metastatic disease. Mesenteric nodule in the anterior left lower quadrant may have been present on 07/04/2016. Continued attention on followup exams is warranted. 2. Aortic atherosclerosis (ICD10-170.0). Coronary artery calcification. 3. Mild subpleural reticulation in the lungs, likely stable and indicative of nonspecific interstitial pneumonitis. 4. Cholelithiasis.  01/02/2017 CT Head  1. No acute intracranial abnormality or abnormal enhancement of the brain identified. 2. Mild progression in  moderate chronic microvascular  ischemic changes and mild brain parenchymal volume loss from 2009.   Genetic testing BRCA mutation was negative   Problem List: Patient Active Problem List   Diagnosis Date Noted  . Abdominal pain 06/05/2017  . Renal failure 04/17/2016  . Severe protein-calorie malnutrition Altamease Oiler: less than 60% of standard weight) ([place]) 11/07/2015  . Essential (primary) hypertension 11/05/2015  . Colovaginal fistula 11/04/2015  . Acid reflux 08/03/2015  . Bilateral cataracts 08/03/2015  . Chicken pox 08/03/2015  . Allergic state 08/03/2015  . Breast lump 08/03/2015  . Hypercholesteremia 08/03/2015  . Malignant melanoma ([place]) 08/03/2015  . Ovarian cancer ([place]) 07/20/2015  . Head or neck swelling, mass, or lump 08/17/2014    Past Medical History: Past Medical History:  Diagnosis Date  . Anxiety   . Cancer ([place])   . GERD (gastroesophageal reflux disease)   . Glaucoma   . Hypercholesteremia   . Hypertension   . Ovarian cancer ([place]) 06/2015   Bilateral Oophorectomies.   Marland Kitchen PONV (postoperative nausea and vomiting)     Past Surgical History: Past Surgical History:  Procedure Laterality Date  . ABDOMINAL HYSTERECTOMY    . BREAST BIOPSY Right 1970's  . EXPLORATORY LAPAROTOMY W/ BOWEL RESECTION  01/11/2016   XL optimal debulking rectosigmoid resection and reanastomosis  . HAND SURGERY     Right  . LAPAROTOMY Left 07/20/2015   Procedure: LAPAROTOMY with Left Salpingooophrectomy;  Surgeon: Honor Loh Ward, MD;  Location: [facility];  Service: Gynecology;  Laterality: Left;  . LAPAROTOMY N/A 07/20/2015   Procedure: LAPAROTOMY;  Surgeon: Gillis Ends, MD;  Location: [facility];  Service: Gynecology;  Laterality: N/A;  . PERIPHERAL VASCULAR CATHETERIZATION N/A 08/10/2015   Procedure: Glori Luis Cath Insertion;  Surgeon: Algernon Huxley, MD;  Location: [facility];  Service: Cardiovascular;  Laterality: N/A;  . REPLACEMENT TOTAL KNEE     Right  . SALPINGOOPHORECTOMY Right 07/20/2015    Procedure: SALPINGO OOPHORECTOMY;  Surgeon: Gillis Ends, MD;  Location: [facility];  Service: Gynecology;  Laterality: Right;    Past Gynecologic History:  Menopause: 1976 06/07/2014 Mammogram IMPRESSION: No mammographic evidence of malignancy. A result letter of this screening mammogram will be mailed directly to the patient.   OB History:  OB History  Gravida Para Term Preterm AB Living  3 1     2     SAB TAB Ectopic Multiple Live Births  2            # Outcome Date GA Lbr Len/2nd Weight Sex Delivery Anes PTL Lv  3 Para           2 SAB           1 SAB               Family History: Family History  Problem Relation Age of Onset  . Stomach cancer Mother   . Osteoporosis Mother   . Breast cancer Neg Hx     Social History: Social History   Social History  . Marital status: Widowed    Spouse name: N/A  . Number of children: N/A  . Years of education: N/A   Occupational History  .      Retired   Social History Main Topics  . Smoking status: Never Smoker  . Smokeless tobacco: Never Used  . Alcohol use No  . Drug use: No  . Sexual activity: Not on file   Other Topics Concern  . Not on file  Social History Narrative  . No narrative on file    Allergies: Allergies  Allergen Reactions  . Clindamycin/Lincomycin Diarrhea and Other (See Comments)    Reaction:  Antibiotic colitis   . Codeine Anxiety    Current Medications: Current Outpatient Prescriptions  Medication Sig Dispense Refill  . acetaminophen (TYLENOL) 500 MG tablet Take 500-1,000 mg by mouth every 6 (six) hours as needed for mild pain, fever or headache.     . bimatoprost (LUMIGAN) 0.01 % SOLN Place 1 drop into both eyes at bedtime.    . fluticasone (FLONASE) 50 MCG/ACT nasal spray Place 2 sprays into both nostrils daily as needed for rhinitis.     Marland Kitchen lidocaine-prilocaine (EMLA) cream Apply 1 application topically as needed (prior to accessing port).    . LORazepam (ATIVAN) 0.5 MG tablet  Take 0.5 mg by mouth 2 (two) times daily.     . meloxicam (MOBIC) 15 MG tablet Take 15 mg by mouth daily.     Marland Kitchen omeprazole (PRILOSEC) 20 MG capsule Take 20 mg by mouth daily.     . ondansetron (ZOFRAN) 4 MG tablet Take 1 tablet (4 mg total) by mouth every 6 (six) hours as needed for nausea. 30 tablet 3  . simvastatin (ZOCOR) 20 MG tablet Take 20 mg by mouth at bedtime.      No current facility-administered medications for this visit.    Facility-Administered Medications Ordered in Other Visits  Medication Dose Route Frequency Provider Last Rate Last Dose  . diphenhydrAMINE (BENADRYL) injection 12.5 mg  12.5 mg Intravenous Once Choksi, Delorise Shiner, MD      . methylPREDNISolone sodium succinate (SOLU-MEDROL) 125 mg/2 mL injection 100 mg  100 mg Intravenous Once Choksi, Janak, MD      . sodium chloride flush (NS) 0.9 % injection 10 mL  10 mL Intravenous PRN Cammie Sickle, MD   10 mL at 05/20/17 1227    Review of Systems General: weakness/fatigue  HEENT: no complaints  Lungs: cough  Cardiac: no complaints  GI: abdominal pain, nausea with intermittent episodes of emesis,decreased appetite.  GU: no pelvic pain  Musculoskeletal: no complaints  Extremities: no complaints  Skin: skin issues  Neuro: no complaints  Endocrine: no complaints  Psych: depression      Objective:  Physical Examination:  BP 122/79   Pulse (!) 103   Temp 97.8 F (36.6 C) (Tympanic)   Resp 18   Ht 4' 11"  (1.499 m)   Wt 116 lb 6.4 oz (52.8 kg)   BMI 23.51 kg/m    Exam deferred from 06/05/2017    General appearance: alert, cooperative and appears stated age HEENT:PERRLA, extra ocular movement intact and sclera clear, anicteric Abdomen: firm, nontender but distended. + distant bowel sounds. No rushes/tinkling appreciated. Non tympanic. Concern for ascites. No palpable masses. Superior incicional reducible hernia (6 cm) o/w well healed incision  Neurological exam reveals alert, oriented, normal speech, no  focal findings or movement disorder noted. Lymphatic survey: negative [place], axillary, and inguinal nodes Extremities: Negative for edema or cords Neuro: Grossly intact  Pelvic: exam chaperoned by nurse;  Vulva: normal appearing vulva with no masses, tenderness or lesions; Vagina: normal vagina with healing at the cuff; Adnexa: surgically absent; Uterus: surgically absent, vaginal cuff well healed; Cervix: absent; Rectal: not performed.    Assessment:  Kathryn Chandler is a 83 y.o. female diagnosed with stage IIIc high grade serous ovarian cancer s/p suboptimal debulking, chemotherapy s/p chemotherapy with weekly abraxane, carboplatin, and bevacizumab, bowel perforation/RV  fistula,  interval optimally debulking with rectosigmoid resection and reanastomosis for rectovaginal fistula and resumption of chemotherapy without bevacizumab. Platinum-sensitive recurrent disease based on imaging, CA125 and ascites 06/05/2017.   Diverticular disease. History of UTI. History of c.Diff.    Medical co-morbidities complicating care: prior abdominal surgery, frailty, performance status of 3..  Plan:   Problem List Items Addressed This Visit    None    Visit Diagnoses    Malignant neoplasm of ovary, unspecified laterality ([place])    -  Primary   Malignant ascites         A discussion was held with the patient and her accompanying family regarding options for management platinum-sensitive recurrent ovarian cancer. Reintroduction n of chemotherapy presently is one option, and I recommended therapy for symptom control, and potential for improved quality of life. She is interested in restarting chemotherapy. In the setting platinum-sensitive disease response rates to platinum-based chemotherapy range from 57.4 - 59%. Complete remission rates are approximately 15-20% with PFS ranges from 8.4 - 10.4 months. She is not a candidate for bevacizumab therapy due to prior GI perforation and close proximicity of peritoneal implant  with pylorus. We discussed goals of care. She has completed [place]. She is DNAR and I provided a stop sign today. I am hopeful she will respond to therapy but prepared for if she has progressive disease.    Dr. Grayland Ormond was able to see her today and arrange for her to restart chemotherapy next week.   She will return to clinic in 3 months for follow up with CA125 and exam.   Gillis Ends, MD    CC:  Larey Days, MD  Albina Billet, MD [address]   [place], [place] [address] [phone]

## 2017-06-26 NOTE — Progress Notes (Signed)
Pt has a lot of fatigue, not eating hardly any at all. Has no appetite, drinking water. Eats cereal in am. Does not like the ensure or boost- the milk hurts her stomach.

## 2017-06-29 MED ORDER — LIDOCAINE-PRILOCAINE 2.5-2.5 % EX CREA: TOPICAL_CREAM | CUTANEOUS | 3 refills | Status: DC

## 2017-06-29 MED ORDER — PROCHLORPERAZINE MALEATE 10 MG PO TABS: 10.0000 mg | ORAL_TABLET | Freq: Four times a day (QID) | ORAL | 2 refills | Status: DC | PRN

## 2017-06-29 MED ORDER — ONDANSETRON HCL 8 MG PO TABS: 8.0000 mg | ORAL_TABLET | Freq: Two times a day (BID) | ORAL | 2 refills | Status: DC | PRN

## 2017-06-29 NOTE — Progress Notes (Signed)
[place]  [phone] [phone]  ID: [name] OB: August 01, 1929  MR#: [id number]  LOV#:[id number]  Patient Care Team: Albina Billet, MD as PCP - General (Internal Medicine) Gillis Ends, MD as Referring Physician (Obstetrics and Gynecology) Ward, Honor Loh, MD as Referring Physician (Obstetrics and Gynecology) Lloyd Huger, MD as Consulting Physician (Oncology)  CHIEF COMPLAINT: Stage IIIc left ovarian cancer status post suboptimal debulking and chemotherapy.  INTERVAL HISTORY: Patient returns to clinic today as an add-on for further evaluation and treatment planning after concern for recurrent disease. She currently feels well and is asymptomatic. She does not complain of weakness or fatigue today. She has no neurologic complaints. She denies any fevers. She denies any chest pain, shortness of breath, cough, or hemoptysis. She has a fair appetite, but denies weight loss. She has no nausea, vomiting, constipation, or diarrhea. She denies any abdominal bloating.  She has no urinary complaints. Patient offers no specific complaints today.  REVIEW OF SYSTEMS:   Review of Systems  Constitutional: Negative for fever, malaise/fatigue and weight loss.  Eyes: Negative for blurred vision.  Respiratory: Negative.  Negative for cough and shortness of breath.   Cardiovascular: Negative.  Negative for chest pain and leg swelling.  Gastrointestinal: Negative.  Negative for abdominal pain, blood in stool, constipation, diarrhea, melena, nausea and vomiting.  Genitourinary: Negative.   Musculoskeletal: Negative.   Neurological: Negative.  Negative for weakness.  Psychiatric/Behavioral: Negative.  The patient is not nervous/anxious.     As per HPI. Otherwise, a complete review of systems is negative.  PAST MEDICAL HISTORY: Past Medical History:  Diagnosis Date  . Anxiety   . Cancer ([place])   . GERD (gastroesophageal reflux disease)     . Glaucoma   . Hypercholesteremia   . Hypertension   . Ovarian cancer ([place]) 06/2015   Bilateral Oophorectomies.   Marland Kitchen PONV (postoperative nausea and vomiting)     PAST SURGICAL HISTORY: Past Surgical History:  Procedure Laterality Date  . ABDOMINAL HYSTERECTOMY    . BREAST BIOPSY Right 1970's  . EXPLORATORY LAPAROTOMY W/ BOWEL RESECTION  01/11/2016   XL optimal debulking rectosigmoid resection and reanastomosis  . HAND SURGERY     Right  . LAPAROTOMY Left 07/20/2015   Procedure: LAPAROTOMY with Left Salpingooophrectomy;  Surgeon: Honor Loh Ward, MD;  Location: [facility];  Service: Gynecology;  Laterality: Left;  . LAPAROTOMY N/A 07/20/2015   Procedure: LAPAROTOMY;  Surgeon: Gillis Ends, MD;  Location: [facility];  Service: Gynecology;  Laterality: N/A;  . PERIPHERAL VASCULAR CATHETERIZATION N/A 08/10/2015   Procedure: Glori Luis Cath Insertion;  Surgeon: Algernon Huxley, MD;  Location: [facility] [facility];  Service: Cardiovascular;  Laterality: N/A;  . REPLACEMENT TOTAL KNEE     Right  . SALPINGOOPHORECTOMY Right 07/20/2015   Procedure: SALPINGO OOPHORECTOMY;  Surgeon: Gillis Ends, MD;  Location: [facility];  Service: Gynecology;  Laterality: Right;    FAMILY HISTORY: Family History  Problem Relation Age of Onset  . Stomach cancer Mother   . Osteoporosis Mother   . Breast cancer Neg Hx        ADVANCED DIRECTIVES:    HEALTH MAINTENANCE: Social History  Substance Use Topics  . Smoking status: Never Smoker  . Smokeless tobacco: Never Used  . Alcohol use No     Colonoscopy:  PAP:  Bone density:  Lipid panel:  Allergies  Allergen Reactions  . Clindamycin/Lincomycin Diarrhea and Other (See Comments)    Reaction:  Antibiotic colitis   . Codeine Anxiety    Current Outpatient Prescriptions  Medication Sig Dispense Refill  . acetaminophen (TYLENOL) 500 MG tablet Take 500-1,000 mg by mouth every 6 (six) hours as needed for mild pain, fever or headache.     .  bimatoprost (LUMIGAN) 0.01 % SOLN Place 1 drop into both eyes at bedtime.    . fluticasone (FLONASE) 50 MCG/ACT nasal spray Place 2 sprays into both nostrils daily as needed for rhinitis.     Marland Kitchen lidocaine-prilocaine (EMLA) cream Apply 1 application topically as needed (prior to accessing port).    . LORazepam (ATIVAN) 0.5 MG tablet Take 0.5 mg by mouth 2 (two) times daily.     . meloxicam (MOBIC) 15 MG tablet Take 15 mg by mouth daily.     Marland Kitchen omeprazole (PRILOSEC) 20 MG capsule Take 20 mg by mouth daily.     . ondansetron (ZOFRAN) 4 MG tablet Take 1 tablet (4 mg total) by mouth every 6 (six) hours as needed for nausea. 30 tablet 3  . simvastatin (ZOCOR) 20 MG tablet Take 20 mg by mouth at bedtime.      No current facility-administered medications for this visit.    Facility-Administered Medications Ordered in Other Visits  Medication Dose Route Frequency Provider Last Rate Last Dose  . diphenhydrAMINE (BENADRYL) injection 12.5 mg  12.5 mg Intravenous Once Choksi, Delorise Shiner, MD      . methylPREDNISolone sodium succinate (SOLU-MEDROL) 125 mg/2 mL injection 100 mg  100 mg Intravenous Once Choksi, Janak, MD      . sodium chloride flush (NS) 0.9 % injection 10 mL  10 mL Intravenous PRN Cammie Sickle, MD   10 mL at 05/20/17 1227    OBJECTIVE: There were no vitals filed for this visit.   There is no height or weight on file to calculate BMI.    ECOG FS:1 - Symptomatic but completely ambulatory  General: Thin, no acute distress. Eyes: Pink conjunctiva, anicteric sclera. Lungs: Clear to auscultation bilaterally. Heart: Regular rate and rhythm. No rubs, murmurs, or gallops. Abdomen: Midline reducible hernia, nontender. Musculoskeletal: No edema, cyanosis, or clubbing. Neuro: Alert, answering all questions appropriately. Cranial nerves grossly intact. Skin: No rashes or petechiae noted. Psych: Normal affect.   LAB RESULTS:  Lab Results  Component Value Date   NA 135 06/06/2017   K 3.9  06/06/2017   CL 101 06/06/2017   CO2 27 06/06/2017   GLUCOSE 91 06/06/2017   BUN 10 06/06/2017   CREATININE 0.90 06/06/2017   CALCIUM 8.5 (L) 06/06/2017   PROT 7.1 06/05/2017   ALBUMIN 3.3 (L) 06/05/2017   AST 31 06/05/2017   ALT 15 06/05/2017   ALKPHOS 90 06/05/2017   BILITOT 0.9 06/05/2017   GFRNONAA 56 (L) 06/06/2017   GFRAA >60 06/06/2017    Lab Results  Component Value Date   WBC 9.8 06/05/2017   NEUTROABS 5.5 05/20/2017   HGB 11.6 (L) 06/05/2017   HCT 34.4 (L) 06/05/2017   MCV 84.8 06/05/2017   PLT 351 06/05/2017    Lab Results  Component Value Date   CA125 174.9 (H) 05/20/2017    STUDIES: Dg Abd 1 View  Result Date: 06/06/2017 CLINICAL DATA:  Abdominal pain EXAM: ABDOMEN - 1 VIEW COMPARISON:  Abdominal CT 01/02/2017 FINDINGS: Normal bowel gas pattern. Sigmoidectomy type changes. No concerning mass effect or gas collection. Vascular calcifications are present. There is chronic cardiomegaly. New a hazy density at the left base. IMPRESSION: 1. Normal bowel gas pattern.  2. New opacification at the left base that is partly seen. Recommend chest x-ray correlation. Electronically Signed   By: Monte Fantasia M.D.   On: 06/06/2017 09:15   Ct Abdomen Pelvis W Contrast  Result Date: 06/06/2017 CLINICAL DATA:  Abdominal pain and distention for several weeks with associated nausea and vomiting. History of ovarian cancer. EXAM: CT ABDOMEN AND PELVIS WITH CONTRAST TECHNIQUE: Multidetector CT imaging of the abdomen and pelvis was performed using the standard protocol following bolus administration of intravenous contrast. CONTRAST:  19mL ISOVUE-300 IOPAMIDOL (ISOVUE-300) INJECTION 61% COMPARISON:  Abdominal x-ray from same day. CT chest, abdomen, and pelvis dated January 02, 2017. FINDINGS: Lower chest: New moderate left and small right pleural effusions with adjacent atelectasis. Trace pericardial effusion. Hepatobiliary: Subcentimeter low-density lesion in segment 6 is too small to  characterize, but was not definitively seen on prior CT. Cholelithiasis. No biliary dilatation. Pancreas: Negative. Spleen: Normal in size without focal abnormality. Adrenals/Urinary Tract: New mild right renal pelvic fullness. Unchanged right inferior pole parapelvic cyst and scattered subcentimeter low density lesions which are too small to characterize. No hydronephrosis. The bladder is unremarkable. The adrenal glands are normal. Stomach/Bowel: Small hiatal hernia. There is a 2.4 x 1.7 cm low-density nodule which appears associated with the gastric wall near the pylorus (image 33, series 2), new from prior study. No obstruction. Prior rectosigmoid colonic resection with anastomosis. New wall thickening of the residual rectum with infiltration of the presacral fat. Vascular/Lymphatic: Aortoiliac atherosclerotic vascular disease. No lymphadenopathy. Reproductive: Status post hysterectomy and bilateral salpingo-oophorectomy. Other: New moderate to large ascites with diffuse thickening, enhancement, and nodularity of the peritoneum. Ascites extends into the lower posterior mediastinum. New scattered peritoneal implants, for instance in the pelvis (image 66, series 2) and left upper quadrant (image 25, series 2). Midline supraumbilical abdominal wall hernia containing ascites. Small bilateral fat containing hernias again noted. Musculoskeletal: No acute or significant osseous findings. Degenerative changes of the lumbar spine, similar to prior study. IMPRESSION: 1. New moderate to large ascites with diffuse thickening, enhancement, and nodularity of the peritoneum, consistent with worsening metastatic disease given history of ovarian cancer. 2. New low-density 2.4 cm nodule which appears associated with the gastric wall near the pylorus, possibly representing a serosal implant. 3. Status post prior rectosigmoid colonic resection with primary reanastomosis, with new wall thickening of the residual rectum and  infiltration of the presacral fat, which may reflect proctitis. 4. New moderate left and small right pleural effusions, incompletely visualized. Electronically Signed   By: Titus Dubin M.D.   On: 06/06/2017 14:36   US Paracentesis  Result Date: 06/06/2017 INDICATION: 83 year old female, ovarian cancer and malignant ascites. EXAM: ULTRASOUND GUIDED  PARACENTESIS MEDICATIONS: None. COMPLICATIONS: None immediate. PROCEDURE: Informed written consent was obtained from the patient after a discussion of the risks, benefits and alternatives to treatment. A timeout was performed prior to the initiation of the procedure. Initial ultrasound scanning demonstrates a large amount of ascites within the right lower abdominal quadrant. The right lower abdomen was prepped and draped in the usual sterile fashion. 1% lidocaine with epinephrine was used for local anesthesia. Following this, a 6 Fr Safe-T-Centesis catheter was introduced. An ultrasound image was saved for documentation purposes. The paracentesis was performed. The catheter was removed and a dressing was applied. The patient tolerated the procedure well without immediate post procedural complication. FINDINGS: A total of approximately 2900 mL of golden ascitic fluid was removed. Samples were sent to the laboratory as requested by the clinical team.  IMPRESSION: Successful ultrasound-guided paracentesis yielding 2.9 liters of peritoneal fluid. Electronically Signed   By: Jacqulynn Cadet M.D.   On: 06/06/2017 17:14   Oncology History: Patient previously underwent suboptimal debulking in September 2016. She then completed 3 cycles of carboplatinum, Abraxane, and Avastin between August 15, 2015 and October 31, 2015. She then received 5 total treatments of carboplatinum and Abraxane only between March 13, 2016 and Apr 13, 2016. Treatment was then discontinued per patient preference.   ASSESSMENT: Stage IIIc left ovarian cancer status post suboptimal debulking  and chemotherapy.  PLAN:    1. Stage IIIc left ovarian cancer: See oncology history from previous provider as above. CT scan results from June 06, 2017 reviewed independently and reported as above with concern for recurrent disease. Patient also had a paracentesis on the same day healing 2.9 L of peritoneal fluid. Her CA-125 also continues to trend up. Despite declining additional treatment previously, patient has changed her mind and wish to pursue palliative chemotherapy. Because it has been greater than 1 year since her last treatment, with the same treatment she received previously which is carboplatinum and Abraxane on day 1 with Abraxane only on days 1, 8, and 15 with day 22 off. She will return to clinic in 1 week to receive cycle 1, day 1.  2. Hypertension: Patient's blood pressure is within normal limits today. Monitor.   Approximately 30 minutes was spent in discussion of which greater than 50% was consultation.  Patient expressed understanding and was in agreement with this plan. She also understands that She can call clinic at any time with any questions, concerns, or complaints.     Lloyd Huger, MD   06/29/2017 8:29 AM

## 2017-07-01 NOTE — Progress Notes (Signed)
[facility]  [phone] [phone]  ID: [name] OB: 05-Apr-1929  MR#: [id number]  QZE#:[id number]  Patient Care Team: Albina Billet, MD as PCP - General (Internal Medicine) Gillis Ends, MD as Referring Physician (Obstetrics and Gynecology) Ward, Honor Loh, MD as Referring Physician (Obstetrics and Gynecology) Lloyd Huger, MD as Consulting Physician (Oncology)  CHIEF COMPLAINT: Stage IIIc left ovarian cancer status post suboptimal debulking and chemotherapy.  INTERVAL HISTORY: Patient returns to clinic today for further evaluation and consideration of reinitiation of treatment. Over the past week she has had increased nausea, poor appetite, and increasing weakness and fatigue. She has no neurologic complaints. She denies any fevers. She denies any chest pain, shortness of breath, cough, or hemoptysis. She has no vomiting, constipation, or diarrhea. She denies any abdominal bloating.  She has no urinary complaints. Patient offers no further specific complaints today.  REVIEW OF SYSTEMS:   Review of Systems  Constitutional: Positive for malaise/fatigue. Negative for fever and weight loss.  Eyes: Negative for blurred vision.  Respiratory: Negative.  Negative for cough and shortness of breath.   Cardiovascular: Negative.  Negative for chest pain and leg swelling.  Gastrointestinal: Positive for nausea. Negative for abdominal pain, blood in stool, constipation, diarrhea, melena and vomiting.  Genitourinary: Negative.   Musculoskeletal: Negative.   Neurological: Positive for weakness.  Psychiatric/Behavioral: Negative.  The patient is not nervous/anxious.     As per HPI. Otherwise, a complete review of systems is negative.  PAST MEDICAL HISTORY: Past Medical History:  Diagnosis Date  . Anxiety   . Cancer ([place])   . GERD (gastroesophageal reflux disease)   . Glaucoma   . Hypercholesteremia   . Hypertension   . Ovarian  cancer ([place]) 06/2015   Bilateral Oophorectomies.   Marland Kitchen PONV (postoperative nausea and vomiting)     PAST SURGICAL HISTORY: Past Surgical History:  Procedure Laterality Date  . ABDOMINAL HYSTERECTOMY    . BREAST BIOPSY Right 1970's  . EXPLORATORY LAPAROTOMY W/ BOWEL RESECTION  01/11/2016   XL optimal debulking rectosigmoid resection and reanastomosis  . HAND SURGERY     Right  . LAPAROTOMY Left 07/20/2015   Procedure: LAPAROTOMY with Left Salpingooophrectomy;  Surgeon: Honor Loh Ward, MD;  Location: [facility];  Service: Gynecology;  Laterality: Left;  . LAPAROTOMY N/A 07/20/2015   Procedure: LAPAROTOMY;  Surgeon: Gillis Ends, MD;  Location: [facility];  Service: Gynecology;  Laterality: N/A;  . PERIPHERAL VASCULAR CATHETERIZATION N/A 08/10/2015   Procedure: Glori Luis Cath Insertion;  Surgeon: Algernon Huxley, MD;  Location: [place] [facility];  Service: Cardiovascular;  Laterality: N/A;  . REPLACEMENT TOTAL KNEE     Right  . SALPINGOOPHORECTOMY Right 07/20/2015   Procedure: SALPINGO OOPHORECTOMY;  Surgeon: Gillis Ends, MD;  Location: [facility];  Service: Gynecology;  Laterality: Right;    FAMILY HISTORY: Family History  Problem Relation Age of Onset  . Stomach cancer Mother   . Osteoporosis Mother   . Breast cancer Neg Hx        ADVANCED DIRECTIVES:    HEALTH MAINTENANCE: Social History  Substance Use Topics  . Smoking status: Never Smoker  . Smokeless tobacco: Never Used  . Alcohol use No     Colonoscopy:  PAP:  Bone density:  Lipid panel:  Allergies  Allergen Reactions  . Clindamycin/Lincomycin Diarrhea and Other (See Comments)    Reaction:  Antibiotic colitis   . Codeine Anxiety    Current Outpatient Prescriptions  Medication Sig Dispense Refill  . acetaminophen (TYLENOL) 500 MG tablet Take 500-1,000 mg by mouth every 6 (six) hours as needed for mild pain, fever or headache.     . bimatoprost (LUMIGAN) 0.01 % SOLN Place 1 drop into both eyes at  bedtime.    . fluticasone (FLONASE) 50 MCG/ACT nasal spray Place 2 sprays into both nostrils daily as needed for rhinitis.     Marland Kitchen lidocaine-prilocaine (EMLA) cream Apply 1 application topically as needed (prior to accessing port).    . LORazepam (ATIVAN) 0.5 MG tablet Take 0.5 mg by mouth 2 (two) times daily.     . meloxicam (MOBIC) 15 MG tablet Take 15 mg by mouth daily.     Marland Kitchen omeprazole (PRILOSEC) 20 MG capsule Take 20 mg by mouth daily.     . prochlorperazine (COMPAZINE) 10 MG tablet Take 1 tablet (10 mg total) by mouth every 6 (six) hours as needed (Nausea or vomiting). 60 tablet 2  . simvastatin (ZOCOR) 20 MG tablet Take 20 mg by mouth at bedtime.     Marland Kitchen dexamethasone (DECADRON) 4 MG tablet Take 1 tablet (4 mg total) by mouth daily. 30 tablet 1  . ondansetron (ZOFRAN) 8 MG tablet Take 1 tablet (8 mg total) by mouth every 8 (eight) hours as needed for nausea or vomiting. 60 tablet 2   No current facility-administered medications for this visit.    Facility-Administered Medications Ordered in Other Visits  Medication Dose Route Frequency Provider Last Rate Last Dose  . diphenhydrAMINE (BENADRYL) injection 12.5 mg  12.5 mg Intravenous Once Choksi, Delorise Shiner, MD      . methylPREDNISolone sodium succinate (SOLU-MEDROL) 125 mg/2 mL injection 100 mg  100 mg Intravenous Once Choksi, Janak, MD      . sodium chloride flush (NS) 0.9 % injection 10 mL  10 mL Intravenous PRN Charlaine Dalton R, MD   10 mL at 05/20/17 1227    OBJECTIVE: Vitals:   07/03/17 1003  BP: 128/83  Pulse: (!) 114  Resp: 20  Temp: (!) 97.2 F (36.2 C)     Body mass index is 23.67 kg/m.    ECOG FS:1 - Symptomatic but completely ambulatory  General: Thin, no acute distress. Eyes: Pink conjunctiva, anicteric sclera. Lungs: Clear to auscultation bilaterally. Heart: Regular rate and rhythm. No rubs, murmurs, or gallops. Abdomen: Midline reducible hernia, nontender. Musculoskeletal: No edema, cyanosis, or clubbing. Neuro:  Alert, answering all questions appropriately. Cranial nerves grossly intact. Skin: No rashes or petechiae noted. Psych: Normal affect.   LAB RESULTS:  Lab Results  Component Value Date   NA 133 (L) 07/03/2017   K 3.3 (L) 07/03/2017   CL 97 (L) 07/03/2017   CO2 24 07/03/2017   GLUCOSE 121 (H) 07/03/2017   BUN 13 07/03/2017   CREATININE 1.06 (H) 07/03/2017   CALCIUM 8.6 (L) 07/03/2017   PROT 7.1 07/03/2017   ALBUMIN 3.1 (L) 07/03/2017   AST 30 07/03/2017   ALT 14 07/03/2017   ALKPHOS 124 07/03/2017   BILITOT 1.1 07/03/2017   GFRNONAA 46 (L) 07/03/2017   GFRAA 54 (L) 07/03/2017    Lab Results  Component Value Date   WBC 9.7 07/03/2017   NEUTROABS 6.4 07/03/2017   HGB 12.4 07/03/2017   HCT 36.9 07/03/2017   MCV 83.3 07/03/2017   PLT 373 07/03/2017    Lab Results  Component Value Date   CA125 174.9 (H) 05/20/2017    STUDIES: Dg Abd 1 View  Result Date: 06/06/2017 CLINICAL DATA:  Abdominal pain EXAM: ABDOMEN - 1 VIEW COMPARISON:  Abdominal CT 01/02/2017 FINDINGS: Normal bowel gas pattern. Sigmoidectomy type changes. No concerning mass effect or gas collection. Vascular calcifications are present. There is chronic cardiomegaly. New a hazy density at the left base. IMPRESSION: 1. Normal bowel gas pattern. 2. New opacification at the left base that is partly seen. Recommend chest x-ray correlation. Electronically Signed   By: Monte Fantasia M.D.   On: 06/06/2017 09:15   Ct Abdomen Pelvis W Contrast  Result Date: 06/06/2017 CLINICAL DATA:  Abdominal pain and distention for several weeks with associated nausea and vomiting. History of ovarian cancer. EXAM: CT ABDOMEN AND PELVIS WITH CONTRAST TECHNIQUE: Multidetector CT imaging of the abdomen and pelvis was performed using the standard protocol following bolus administration of intravenous contrast. CONTRAST:  133mL ISOVUE-300 IOPAMIDOL (ISOVUE-300) INJECTION 61% COMPARISON:  Abdominal x-ray from same day. CT chest, abdomen, and  pelvis dated January 02, 2017. FINDINGS: Lower chest: New moderate left and small right pleural effusions with adjacent atelectasis. Trace pericardial effusion. Hepatobiliary: Subcentimeter low-density lesion in segment 6 is too small to characterize, but was not definitively seen on prior CT. Cholelithiasis. No biliary dilatation. Pancreas: Negative. Spleen: Normal in size without focal abnormality. Adrenals/Urinary Tract: New mild right renal pelvic fullness. Unchanged right inferior pole parapelvic cyst and scattered subcentimeter low density lesions which are too small to characterize. No hydronephrosis. The bladder is unremarkable. The adrenal glands are normal. Stomach/Bowel: Small hiatal hernia. There is a 2.4 x 1.7 cm low-density nodule which appears associated with the gastric wall near the pylorus (image 33, series 2), new from prior study. No obstruction. Prior rectosigmoid colonic resection with anastomosis. New wall thickening of the residual rectum with infiltration of the presacral fat. Vascular/Lymphatic: Aortoiliac atherosclerotic vascular disease. No lymphadenopathy. Reproductive: Status post hysterectomy and bilateral salpingo-oophorectomy. Other: New moderate to large ascites with diffuse thickening, enhancement, and nodularity of the peritoneum. Ascites extends into the lower posterior mediastinum. New scattered peritoneal implants, for instance in the pelvis (image 66, series 2) and left upper quadrant (image 25, series 2). Midline supraumbilical abdominal wall hernia containing ascites. Small bilateral fat containing hernias again noted. Musculoskeletal: No acute or significant osseous findings. Degenerative changes of the lumbar spine, similar to prior study. IMPRESSION: 1. New moderate to large ascites with diffuse thickening, enhancement, and nodularity of the peritoneum, consistent with worsening metastatic disease given history of ovarian cancer. 2. New low-density 2.4 cm nodule which  appears associated with the gastric wall near the pylorus, possibly representing a serosal implant. 3. Status post prior rectosigmoid colonic resection with primary reanastomosis, with new wall thickening of the residual rectum and infiltration of the presacral fat, which may reflect proctitis. 4. New moderate left and small right pleural effusions, incompletely visualized. Electronically Signed   By: Titus Dubin M.D.   On: 06/06/2017 14:36   US Paracentesis  Result Date: 06/06/2017 INDICATION: 83 year old female, ovarian cancer and malignant ascites. EXAM: ULTRASOUND GUIDED  PARACENTESIS MEDICATIONS: None. COMPLICATIONS: None immediate. PROCEDURE: Informed written consent was obtained from the patient after a discussion of the risks, benefits and alternatives to treatment. A timeout was performed prior to the initiation of the procedure. Initial ultrasound scanning demonstrates a large amount of ascites within the right lower abdominal quadrant. The right lower abdomen was prepped and draped in the usual sterile fashion. 1% lidocaine with epinephrine was used for local anesthesia. Following this, a 6 Fr Safe-T-Centesis catheter was introduced. An ultrasound image was saved for documentation purposes.  The paracentesis was performed. The catheter was removed and a dressing was applied. The patient tolerated the procedure well without immediate post procedural complication. FINDINGS: A total of approximately 2900 mL of golden ascitic fluid was removed. Samples were sent to the laboratory as requested by the clinical team. IMPRESSION: Successful ultrasound-guided paracentesis yielding 2.9 liters of peritoneal fluid. Electronically Signed   By: Jacqulynn Cadet M.D.   On: 06/06/2017 17:14   Oncology History: Patient previously underwent suboptimal debulking in September 2016. She then completed 3 cycles of carboplatinum, Abraxane, and Avastin between August 15, 2015 and October 31, 2015. She then received  5 total treatments of carboplatinum and Abraxane only between March 13, 2016 and Apr 13, 2016. Treatment was then discontinued per patient preference.   ASSESSMENT: Stage IIIc left ovarian cancer status post suboptimal debulking and chemotherapy.  PLAN:    1. Stage IIIc left ovarian cancer: See oncology history from previous provider as above. CT scan results from June 06, 2017 reviewed independently and reported as above with concern for recurrent disease. Patient also had a paracentesis on the same day removing 2.9 L of peritoneal fluid. Her CA-125 also continues to trend up. Despite declining additional treatment previously, patient has changed her mind and wish to pursue palliative chemotherapy. Because it has been greater than 1 year since her last treatment, will proceed with the same treatment she received previously which is carboplatinum and Abraxane on day 1 with Abraxane only on days 1, 8, and 15 with day 22 off. Because of patient's nausea and increasing weakness and fatigue, will pursue IV fluids only today and then patient will return to clinic in 1 week for reconsideration of cycle 1, day 1.  2. Hypertension: Patient's blood pressure is within normal limits today. Monitor.  3. Nausea and vomiting: Patient will receive IV Decadron and Zofran today. 4. Weakness and fatigue: Patient received 1 L of IV fluids today. Patient will also return to clinic on Friday for additional IV fluids and IV antiemetics. 5. Poor appetite: Consider dietary consult in the future.   Patient expressed understanding and was in agreement with this plan. She also understands that She can call clinic at any time with any questions, concerns, or complaints.     Lloyd Huger, MD   07/05/2017 9:47 AM

## 2017-07-03 ENCOUNTER — Inpatient Hospital Stay: Payer: Medicare Other

## 2017-07-03 ENCOUNTER — Inpatient Hospital Stay (HOSPITAL_BASED_OUTPATIENT_CLINIC_OR_DEPARTMENT_OTHER): Payer: Medicare Other | Admitting: Oncology

## 2017-07-03 VITALS — BP 128/83 | HR 114 | Temp 97.2°F | Resp 20 | Wt 117.2 lb

## 2017-07-03 DIAGNOSIS — I2510 Atherosclerotic heart disease of native coronary artery without angina pectoris: Secondary | ICD-10-CM | POA: Diagnosis not present

## 2017-07-03 DIAGNOSIS — F419 Anxiety disorder, unspecified: Secondary | ICD-10-CM

## 2017-07-03 DIAGNOSIS — Z85820 Personal history of malignant melanoma of skin: Secondary | ICD-10-CM

## 2017-07-03 DIAGNOSIS — I517 Cardiomegaly: Secondary | ICD-10-CM

## 2017-07-03 DIAGNOSIS — J90 Pleural effusion, not elsewhere classified: Secondary | ICD-10-CM | POA: Diagnosis not present

## 2017-07-03 DIAGNOSIS — R630 Anorexia: Secondary | ICD-10-CM

## 2017-07-03 DIAGNOSIS — Z8543 Personal history of malignant neoplasm of ovary: Secondary | ICD-10-CM

## 2017-07-03 DIAGNOSIS — C569 Malignant neoplasm of unspecified ovary: Secondary | ICD-10-CM

## 2017-07-03 DIAGNOSIS — I700 Atherosclerosis of aorta: Secondary | ICD-10-CM

## 2017-07-03 DIAGNOSIS — R531 Weakness: Secondary | ICD-10-CM

## 2017-07-03 DIAGNOSIS — C562 Malignant neoplasm of left ovary: Secondary | ICD-10-CM

## 2017-07-03 DIAGNOSIS — K219 Gastro-esophageal reflux disease without esophagitis: Secondary | ICD-10-CM

## 2017-07-03 DIAGNOSIS — Z79899 Other long term (current) drug therapy: Secondary | ICD-10-CM

## 2017-07-03 DIAGNOSIS — C786 Secondary malignant neoplasm of retroperitoneum and peritoneum: Secondary | ICD-10-CM

## 2017-07-03 DIAGNOSIS — R188 Other ascites: Secondary | ICD-10-CM

## 2017-07-03 DIAGNOSIS — R1904 Left lower quadrant abdominal swelling, mass and lump: Secondary | ICD-10-CM

## 2017-07-03 DIAGNOSIS — R5383 Other fatigue: Secondary | ICD-10-CM

## 2017-07-03 DIAGNOSIS — R110 Nausea: Secondary | ICD-10-CM | POA: Diagnosis not present

## 2017-07-03 DIAGNOSIS — Z800 Family history of malignant neoplasm of digestive organs: Secondary | ICD-10-CM

## 2017-07-03 DIAGNOSIS — I10 Essential (primary) hypertension: Secondary | ICD-10-CM

## 2017-07-03 DIAGNOSIS — K8020 Calculus of gallbladder without cholecystitis without obstruction: Secondary | ICD-10-CM

## 2017-07-03 DIAGNOSIS — E7800 Pure hypercholesterolemia, unspecified: Secondary | ICD-10-CM

## 2017-07-03 LAB — CBC WITH DIFFERENTIAL/PLATELET
Basophils Absolute: 0.1 10*3/uL (ref 0–0.1)
Basophils Relative: 1 %
EOS PCT: 1 %
Eosinophils Absolute: 0.1 10*3/uL (ref 0–0.7)
HCT: 36.9 % (ref 35.0–47.0)
HEMOGLOBIN: 12.4 g/dL (ref 12.0–16.0)
LYMPHS PCT: 21 %
Lymphs Abs: 2 10*3/uL (ref 1.0–3.6)
MCH: 28 pg (ref 26.0–34.0)
MCHC: 33.6 g/dL (ref 32.0–36.0)
MCV: 83.3 fL (ref 80.0–100.0)
MONO ABS: 1.1 10*3/uL — AB (ref 0.2–0.9)
MONOS PCT: 11 %
NEUTROS ABS: 6.4 10*3/uL (ref 1.4–6.5)
Neutrophils Relative %: 66 %
PLATELETS: 373 10*3/uL (ref 150–440)
RBC: 4.43 MIL/uL (ref 3.80–5.20)
RDW: 14.9 % — ABNORMAL HIGH (ref 11.5–14.5)
WBC: 9.7 10*3/uL (ref 3.6–11.0)

## 2017-07-03 LAB — COMPREHENSIVE METABOLIC PANEL
ALK PHOS: 124 U/L (ref 38–126)
ALT: 14 U/L (ref 14–54)
AST: 30 U/L (ref 15–41)
Albumin: 3.1 g/dL — ABNORMAL LOW (ref 3.5–5.0)
Anion gap: 12 (ref 5–15)
BUN: 13 mg/dL (ref 6–20)
CALCIUM: 8.6 mg/dL — AB (ref 8.9–10.3)
CO2: 24 mmol/L (ref 22–32)
CREATININE: 1.06 mg/dL — AB (ref 0.44–1.00)
Chloride: 97 mmol/L — ABNORMAL LOW (ref 101–111)
GFR, EST AFRICAN AMERICAN: 54 mL/min — AB (ref >60)
GFR, EST NON AFRICAN AMERICAN: 46 mL/min — AB (ref >60)
Glucose, Bld: 121 mg/dL — ABNORMAL HIGH (ref 65–99)
Potassium: 3.3 mmol/L — ABNORMAL LOW (ref 3.5–5.1)
Sodium: 133 mmol/L — ABNORMAL LOW (ref 135–145)
Total Bilirubin: 1.1 mg/dL (ref 0.3–1.2)
Total Protein: 7.1 g/dL (ref 6.5–8.1)

## 2017-07-03 MED ORDER — SODIUM CHLORIDE 0.9 % IV SOLN
Freq: Once | INTRAVENOUS | Status: AC
  Administered 2017-07-03: 11:00:00 via INTRAVENOUS
  Filled 2017-07-03: qty 4

## 2017-07-03 MED ORDER — HEPARIN SOD (PORK) LOCK FLUSH 100 UNIT/ML IV SOLN
500.0000 [IU] | Freq: Once | INTRAVENOUS | Status: AC
Start: 2017-07-03 — End: 2017-07-03
  Administered 2017-07-03: 500 [IU] via INTRAVENOUS

## 2017-07-03 MED ORDER — DEXAMETHASONE 4 MG PO TABS: 4.0000 mg | ORAL_TABLET | Freq: Every day | ORAL | 1 refills | Status: AC

## 2017-07-03 MED ORDER — SODIUM CHLORIDE 0.9 % IV SOLN: Freq: Once | INTRAVENOUS | Status: DC

## 2017-07-03 MED ORDER — SODIUM CHLORIDE 0.9 % IV SOLN
Freq: Once | INTRAVENOUS | Status: AC
  Administered 2017-07-03: 11:00:00 via INTRAVENOUS
  Filled 2017-07-03: qty 1000

## 2017-07-03 MED ORDER — ONDANSETRON HCL 8 MG PO TABS: 8.0000 mg | ORAL_TABLET | Freq: Three times a day (TID) | ORAL | 2 refills | Status: AC | PRN

## 2017-07-03 NOTE — Progress Notes (Signed)
Patient reports weakness, decreased appetite and fluid intake and nausea today.

## 2017-07-04 LAB — CA 125: CANCER ANTIGEN (CA) 125: 292.3 U/mL — AB (ref 0.0–38.1)

## 2017-07-05 ENCOUNTER — Inpatient Hospital Stay: Payer: Medicare Other

## 2017-07-05 ENCOUNTER — Ambulatory Visit: Payer: Medicare Other

## 2017-07-05 VITALS — BP 106/70 | HR 103 | Temp 98.1°F | Resp 20

## 2017-07-05 DIAGNOSIS — E860 Dehydration: Secondary | ICD-10-CM

## 2017-07-05 MED ORDER — HEPARIN SOD (PORK) LOCK FLUSH 100 UNIT/ML IV SOLN
INTRAVENOUS | Status: AC
  Filled 2017-07-05: qty 5

## 2017-07-05 MED ORDER — SODIUM CHLORIDE 0.9 % IV SOLN
Freq: Once | INTRAVENOUS | Status: AC
  Administered 2017-07-05: 12:00:00 via INTRAVENOUS
  Filled 2017-07-05: qty 4

## 2017-07-05 MED ORDER — SODIUM CHLORIDE 0.9 % IV SOLN
Freq: Once | INTRAVENOUS | Status: AC
  Administered 2017-07-05: 12:00:00 via INTRAVENOUS
  Filled 2017-07-05: qty 1000

## 2017-07-05 MED ORDER — HEPARIN SOD (PORK) LOCK FLUSH 100 UNIT/ML IV SOLN
500.0000 [IU] | Freq: Once | INTRAVENOUS | Status: AC
  Administered 2017-07-05: 500 [IU] via INTRAVENOUS

## 2017-07-08 ENCOUNTER — Encounter: Payer: Self-pay | Admitting: Emergency Medicine

## 2017-07-08 ENCOUNTER — Emergency Department: Payer: Medicare Other

## 2017-07-08 ENCOUNTER — Inpatient Hospital Stay
Admission: EM | Admit: 2017-07-08 | Discharge: 2017-07-10 | DRG: 755 | Disposition: A | Discharge status: Skilled Nursing Facility | Payer: Medicare Other | Attending: Internal Medicine | Admitting: Internal Medicine

## 2017-07-08 ENCOUNTER — Other Ambulatory Visit: Payer: Self-pay

## 2017-07-08 DIAGNOSIS — J910 Malignant pleural effusion: Secondary | ICD-10-CM | POA: Diagnosis present

## 2017-07-08 DIAGNOSIS — Z90710 Acquired absence of both cervix and uterus: Secondary | ICD-10-CM | POA: Diagnosis not present

## 2017-07-08 DIAGNOSIS — R197 Diarrhea, unspecified: Secondary | ICD-10-CM | POA: Diagnosis present

## 2017-07-08 DIAGNOSIS — Z881 Allergy status to other antibiotic agents status: Secondary | ICD-10-CM

## 2017-07-08 DIAGNOSIS — E876 Hypokalemia: Secondary | ICD-10-CM | POA: Diagnosis present

## 2017-07-08 DIAGNOSIS — J90 Pleural effusion, not elsewhere classified: Secondary | ICD-10-CM | POA: Diagnosis not present

## 2017-07-08 DIAGNOSIS — K219 Gastro-esophageal reflux disease without esophagitis: Secondary | ICD-10-CM | POA: Diagnosis present

## 2017-07-08 DIAGNOSIS — R531 Weakness: Secondary | ICD-10-CM | POA: Diagnosis present

## 2017-07-08 DIAGNOSIS — Z96651 Presence of right artificial knee joint: Secondary | ICD-10-CM | POA: Diagnosis present

## 2017-07-08 DIAGNOSIS — Z66 Do not resuscitate: Secondary | ICD-10-CM | POA: Diagnosis present

## 2017-07-08 DIAGNOSIS — Z885 Allergy status to narcotic agent status: Secondary | ICD-10-CM | POA: Diagnosis not present

## 2017-07-08 DIAGNOSIS — R971 Elevated cancer antigen 125 [CA 125]: Secondary | ICD-10-CM | POA: Diagnosis not present

## 2017-07-08 DIAGNOSIS — I10 Essential (primary) hypertension: Secondary | ICD-10-CM | POA: Diagnosis present

## 2017-07-08 DIAGNOSIS — R0602 Shortness of breath: Secondary | ICD-10-CM

## 2017-07-08 DIAGNOSIS — Z79899 Other long term (current) drug therapy: Secondary | ICD-10-CM | POA: Diagnosis not present

## 2017-07-08 DIAGNOSIS — Z9889 Other specified postprocedural states: Secondary | ICD-10-CM

## 2017-07-08 DIAGNOSIS — F419 Anxiety disorder, unspecified: Secondary | ICD-10-CM | POA: Diagnosis present

## 2017-07-08 DIAGNOSIS — H409 Unspecified glaucoma: Secondary | ICD-10-CM | POA: Diagnosis present

## 2017-07-08 DIAGNOSIS — E785 Hyperlipidemia, unspecified: Secondary | ICD-10-CM | POA: Diagnosis present

## 2017-07-08 DIAGNOSIS — C569 Malignant neoplasm of unspecified ovary: Secondary | ICD-10-CM | POA: Diagnosis not present

## 2017-07-08 DIAGNOSIS — E7800 Pure hypercholesterolemia, unspecified: Secondary | ICD-10-CM | POA: Diagnosis not present

## 2017-07-08 DIAGNOSIS — C562 Malignant neoplasm of left ovary: Secondary | ICD-10-CM | POA: Diagnosis present

## 2017-07-08 DIAGNOSIS — Z800 Family history of malignant neoplasm of digestive organs: Secondary | ICD-10-CM | POA: Diagnosis not present

## 2017-07-08 DIAGNOSIS — R5383 Other fatigue: Secondary | ICD-10-CM | POA: Diagnosis not present

## 2017-07-08 LAB — HEPATIC FUNCTION PANEL
ALBUMIN: 3 g/dL — AB (ref 3.5–5.0)
ALK PHOS: 99 U/L (ref 38–126)
ALT: 34 U/L (ref 14–54)
AST: 55 U/L — AB (ref 15–41)
BILIRUBIN TOTAL: 0.8 mg/dL (ref 0.3–1.2)
Bilirubin, Direct: 0.2 mg/dL (ref 0.1–0.5)
Indirect Bilirubin: 0.6 mg/dL (ref 0.3–0.9)
Total Protein: 6.6 g/dL (ref 6.5–8.1)

## 2017-07-08 LAB — CBC
HCT: 40.1 % (ref 35.0–47.0)
Hemoglobin: 13.1 g/dL (ref 12.0–16.0)
MCH: 27.5 pg (ref 26.0–34.0)
MCHC: 32.5 g/dL (ref 32.0–36.0)
MCV: 84.6 fL (ref 80.0–100.0)
PLATELETS: 356 10*3/uL (ref 150–440)
RBC: 4.74 MIL/uL (ref 3.80–5.20)
RDW: 15.9 % — AB (ref 11.5–14.5)
WBC: 14.3 10*3/uL — ABNORMAL HIGH (ref 3.6–11.0)

## 2017-07-08 LAB — BODY FLUID CELL COUNT WITH DIFFERENTIAL
EOS FL: 0 %
LYMPHS FL: 95 %
MONOCYTE-MACROPHAGE-SEROUS FLUID: 1 %
Neutrophil Count, Fluid: 4 %
OTHER CELLS FL: 0 %
Total Nucleated Cell Count, Fluid: 1944 cu mm

## 2017-07-08 LAB — BASIC METABOLIC PANEL
ANION GAP: 9 (ref 5–15)
BUN: 18 mg/dL (ref 6–20)
CALCIUM: 8.4 mg/dL — AB (ref 8.9–10.3)
CHLORIDE: 104 mmol/L (ref 101–111)
CO2: 25 mmol/L (ref 22–32)
Creatinine, Ser: 0.71 mg/dL (ref 0.44–1.00)
GFR calc non Af Amer: >60 mL/min (ref >60)
Glucose, Bld: 101 mg/dL — ABNORMAL HIGH (ref 65–99)
POTASSIUM: 2.8 mmol/L — AB (ref 3.5–5.1)
Sodium: 138 mmol/L (ref 135–145)

## 2017-07-08 LAB — PROTEIN, PLEURAL OR PERITONEAL FLUID: TOTAL PROTEIN, FLUID: 4.1 g/dL

## 2017-07-08 LAB — LACTATE DEHYDROGENASE, PLEURAL OR PERITONEAL FLUID: LD FL: 82 U/L — AB (ref 3–23)

## 2017-07-08 LAB — BRAIN NATRIURETIC PEPTIDE: B NATRIURETIC PEPTIDE 5: 59 pg/mL (ref 0.0–100.0)

## 2017-07-08 LAB — TROPONIN I: Troponin I: <0.03 ng/mL (ref <0.03)

## 2017-07-08 LAB — GLUCOSE, PLEURAL OR PERITONEAL FLUID: GLUCOSE FL: 113 mg/dL

## 2017-07-08 LAB — PROTIME-INR
INR: 1.1
Prothrombin Time: 14.2 seconds (ref 11.4–15.2)

## 2017-07-08 LAB — ALBUMIN, PLEURAL OR PERITONEAL FLUID: Albumin, Fluid: 2.4 g/dL

## 2017-07-08 MED ORDER — ONDANSETRON HCL 4 MG PO TABS: 8.0000 mg | ORAL_TABLET | Freq: Three times a day (TID) | ORAL | Status: DC | PRN

## 2017-07-08 MED ORDER — SODIUM CHLORIDE 0.9% FLUSH: 10.0000 mL | INTRAVENOUS | Status: DC | PRN

## 2017-07-08 MED ORDER — ACETAMINOPHEN 650 MG RE SUPP: 650.0000 mg | Freq: Four times a day (QID) | RECTAL | Status: DC | PRN

## 2017-07-08 MED ORDER — PANTOPRAZOLE SODIUM 40 MG PO TBEC
DELAYED_RELEASE_TABLET | ORAL | Status: AC
  Administered 2017-07-08: 40 mg via ORAL
  Filled 2017-07-08: qty 1

## 2017-07-08 MED ORDER — POTASSIUM CHLORIDE CRYS ER 20 MEQ PO TBCR
40.0000 meq | EXTENDED_RELEASE_TABLET | Freq: Once | ORAL | Status: AC
  Administered 2017-07-08: 40 meq via ORAL

## 2017-07-08 MED ORDER — LATANOPROST 0.005 % OP SOLN
1.0000 [drp] | Freq: Every day | OPHTHALMIC | Status: DC
  Administered 2017-07-08 – 2017-07-09 (×2): 1 [drp] via OPHTHALMIC
  Filled 2017-07-08: qty 2.5

## 2017-07-08 MED ORDER — ENOXAPARIN SODIUM 40 MG/0.4ML ~~LOC~~ SOLN
SUBCUTANEOUS | Status: AC
  Administered 2017-07-08: 40 mg via SUBCUTANEOUS
  Filled 2017-07-08: qty 0.4

## 2017-07-08 MED ORDER — POTASSIUM CHLORIDE 10 MEQ/100ML IV SOLN
10.0000 meq | INTRAVENOUS | Status: AC
  Administered 2017-07-08 (×2): 10 meq via INTRAVENOUS
  Filled 2017-07-08 (×2): qty 100

## 2017-07-08 MED ORDER — MELOXICAM 7.5 MG PO TABS
15.0000 mg | ORAL_TABLET | Freq: Every day | ORAL | Status: DC
  Administered 2017-07-09 – 2017-07-10 (×2): 15 mg via ORAL
  Filled 2017-07-08 (×2): qty 2

## 2017-07-08 MED ORDER — LIDOCAINE-EPINEPHRINE-TETRACAINE (LET) SOLUTION
3.0000 mL | Freq: Once | NASAL | Status: AC
  Administered 2017-07-08: 3 mL via TOPICAL

## 2017-07-08 MED ORDER — LORAZEPAM 0.5 MG PO TABS
0.5000 mg | ORAL_TABLET | Freq: Two times a day (BID) | ORAL | Status: DC
  Administered 2017-07-08 – 2017-07-10 (×4): 0.5 mg via ORAL
  Filled 2017-07-08 (×4): qty 1

## 2017-07-08 MED ORDER — PANTOPRAZOLE SODIUM 40 MG PO TBEC
40.0000 mg | DELAYED_RELEASE_TABLET | Freq: Every day | ORAL | Status: DC
  Administered 2017-07-08 – 2017-07-10 (×3): 40 mg via ORAL
  Filled 2017-07-08 (×2): qty 1

## 2017-07-08 MED ORDER — LIDOCAINE-EPINEPHRINE-TETRACAINE (LET) SOLUTION
NASAL | Status: AC
  Administered 2017-07-08: 14:00:00 3 mL via TOPICAL
  Filled 2017-07-08: qty 3

## 2017-07-08 MED ORDER — MAGNESIUM SULFATE 2 GM/50ML IV SOLN
2.0000 g | Freq: Once | INTRAVENOUS | Status: AC
  Administered 2017-07-08: 2 g via INTRAVENOUS
  Filled 2017-07-08: qty 50

## 2017-07-08 MED ORDER — FLUTICASONE PROPIONATE 50 MCG/ACT NA SUSP
2.0000 | Freq: Every day | NASAL | Status: DC | PRN
  Filled 2017-07-08: qty 16

## 2017-07-08 MED ORDER — ACETAMINOPHEN 325 MG PO TABS: 650.0000 mg | ORAL_TABLET | Freq: Four times a day (QID) | ORAL | Status: DC | PRN

## 2017-07-08 MED ORDER — DEXAMETHASONE 4 MG PO TABS
4.0000 mg | ORAL_TABLET | Freq: Every day | ORAL | Status: DC
  Administered 2017-07-09 – 2017-07-10 (×2): 4 mg via ORAL
  Filled 2017-07-08 (×2): qty 1

## 2017-07-08 MED ORDER — POTASSIUM CHLORIDE CRYS ER 20 MEQ PO TBCR
EXTENDED_RELEASE_TABLET | ORAL | Status: AC
  Filled 2017-07-08: qty 2

## 2017-07-08 MED ORDER — ENOXAPARIN SODIUM 40 MG/0.4ML ~~LOC~~ SOLN
40.0000 mg | SUBCUTANEOUS | Status: DC
  Administered 2017-07-08 – 2017-07-09 (×2): 40 mg via SUBCUTANEOUS
  Filled 2017-07-08: qty 0.4

## 2017-07-08 NOTE — ED Provider Notes (Signed)
[facility] Emergency Department Provider Note  ____________________________________________   First MD Initiated Contact with Patient 07/08/17 1347     (approximate)  I have reviewed the triage vital signs and the nursing notes.   HISTORY  Chief Complaint Shortness of Breath   HPI Kathryn Chandler is a 83 y.o. female who comes to the emergency department via EMS with several days of progressive shortness of breath. She also has sharpdiffuse chest pain worse with cough improved when not coughing. She is a past medical history of stage III C ovarian cancer and is status post resection chemotherapy and radiation. She had thought to be in remission until about one month ago when she was admitted to our hospital for worsening abdominal distention and had a 3 L paracentesis performed. The ascites were found to be malignant and she was referred to oncology for further evaluation. The patient is currently scheduled to resume chemotherapy however she has not yet resumed. She denies fevers or chills. She was unable to sleep last night secondary to anxiety chest pain and shortness of breath. He has no history of deep vein thrombosis or pulmonary embolism.   Past Medical History:  Diagnosis Date  . Anxiety   . Cancer ([place])   . GERD (gastroesophageal reflux disease)   . Glaucoma   . Hypercholesteremia   . Hypertension   . Ovarian cancer ([place]) 06/2015   Bilateral Oophorectomies.   Marland Kitchen PONV (postoperative nausea and vomiting)     Patient Active Problem List   Diagnosis Date Noted  . Abdominal pain 06/05/2017  . Renal failure 04/17/2016  . Severe protein-calorie malnutrition Altamease Oiler: less than 60% of standard weight) ([place]) 11/07/2015  . Essential (primary) hypertension 11/05/2015  . Colovaginal fistula 11/04/2015  . Acid reflux 08/03/2015  . Bilateral cataracts 08/03/2015  . Chicken pox 08/03/2015  . Allergic state 08/03/2015  . Breast lump 08/03/2015  .  Hypercholesteremia 08/03/2015  . Malignant melanoma ([place]) 08/03/2015  . Ovarian cancer ([place]) 07/20/2015  . Head or neck swelling, mass, or lump 08/17/2014    Past Surgical History:  Procedure Laterality Date  . ABDOMINAL HYSTERECTOMY    . BREAST BIOPSY Right 1970's  . EXPLORATORY LAPAROTOMY W/ BOWEL RESECTION  01/11/2016   XL optimal debulking rectosigmoid resection and reanastomosis  . HAND SURGERY     Right  . LAPAROTOMY Left 07/20/2015   Procedure: LAPAROTOMY with Left Salpingooophrectomy;  Surgeon: Honor Loh Ward, MD;  Location: [facility];  Service: Gynecology;  Laterality: Left;  . LAPAROTOMY N/A 07/20/2015   Procedure: LAPAROTOMY;  Surgeon: Gillis Ends, MD;  Location: [facility];  Service: Gynecology;  Laterality: N/A;  . PERIPHERAL VASCULAR CATHETERIZATION N/A 08/10/2015   Procedure: Glori Luis Cath Insertion;  Surgeon: Algernon Huxley, MD;  Location: [place] [facility];  Service: Cardiovascular;  Laterality: N/A;  . REPLACEMENT TOTAL KNEE     Right  . SALPINGOOPHORECTOMY Right 07/20/2015   Procedure: SALPINGO OOPHORECTOMY;  Surgeon: Gillis Ends, MD;  Location: [facility];  Service: Gynecology;  Laterality: Right;    Prior to Admission medications   Medication Sig Start Date End Date Taking? Authorizing Provider  acetaminophen (TYLENOL) 500 MG tablet Take 500-1,000 mg by mouth every 6 (six) hours as needed for mild pain, fever or headache.     [provider]  bimatoprost (LUMIGAN) 0.01 % SOLN Place 1 drop into both eyes at bedtime.    [provider]  dexamethasone (DECADRON) 4 MG tablet Take 1 tablet (4  mg total) by mouth daily. 07/03/17   Lloyd Huger, MD  fluticasone (FLONASE) 50 MCG/ACT nasal spray Place 2 sprays into both nostrils daily as needed for rhinitis.     [provider]  lidocaine-prilocaine (EMLA) cream Apply 1 application topically as needed (prior to accessing port).    [provider]  LORazepam (ATIVAN) 0.5 MG  tablet Take 0.5 mg by mouth 2 (two) times daily.     [provider]  meloxicam (MOBIC) 15 MG tablet Take 15 mg by mouth daily.     [provider]  omeprazole (PRILOSEC) 20 MG capsule Take 20 mg by mouth daily.     [provider]  ondansetron (ZOFRAN) 8 MG tablet Take 1 tablet (8 mg total) by mouth every 8 (eight) hours as needed for nausea or vomiting. 07/03/17   Lloyd Huger, MD  prochlorperazine (COMPAZINE) 10 MG tablet Take 1 tablet (10 mg total) by mouth every 6 (six) hours as needed (Nausea or vomiting). 06/29/17   Lloyd Huger, MD  simvastatin (ZOCOR) 20 MG tablet Take 20 mg by mouth at bedtime.     [provider]    Allergies Clindamycin/lincomycin and Codeine  Family History  Problem Relation Age of Onset  . Stomach cancer Mother   . Osteoporosis Mother   . Breast cancer Neg Hx     Social History Social History  Substance Use Topics  . Smoking status: Never Smoker  . Smokeless tobacco: Never Used  . Alcohol use No    Review of Systems Constitutional: No fever/chills Eyes: No visual changes. ENT: No sore throat. Cardiovascular: Positive chest pain. Respiratory: Positive shortness of breath. Gastrointestinal: No abdominal pain.  No nausea, no vomiting.  No diarrhea.  No constipation. Genitourinary: Negative for dysuria. Musculoskeletal: Negative for back pain. Skin: Negative for rash. Neurological: Negative for headaches, focal weakness or numbness.   ____________________________________________   PHYSICAL EXAM:  VITAL SIGNS: ED Triage Vitals  Enc Vitals Group     BP --      Pulse --      Resp --      Temp --      Temp src --      SpO2 --      Weight 07/08/17 1347 115 lb (52.2 kg)     Height 07/08/17 1347 4\' 11"  (1.499 m)     Head Circumference --      Peak Flow --      Pain Score 07/08/17 1346 0     Pain Loc --      Pain Edu? --      Excl. in [place]? --     Constitutional: Alert and oriented 4  appears quite short of breath able to speak in short sentences only Eyes: PERRL EOMI. Head: Atraumatic. Nose: No congestion/rhinnorhea. Mouth/Throat: No trismus Neck: No stridor.   Cardiovascular: Tachycardic rate, regular rhythm. Grossly normal heart sounds.  Good peripheral circulation. Respiratory: Taking frequent shallow breaths in moderate respiratory distress and crackles on the right base decreased breath sounds on the left Gastrointestinal: Slightly distended but soft nontender Musculoskeletal: Legs are equal in size 1+ pitting edema bilaterally  Neurologic:  Normal speech and language. No gross focal neurologic deficits are appreciated. Skin:  Skin is warm, dry and intact. No rash noted. Psychiatric: Mood and affect are normal. Speech and behavior are normal.    ____________________________________________   DIFFERENTIAL includes but not limited to  Pneumothorax, pleural effusion, pulmonary embolus, abdominal ascites, pneumonia, sepsis  ____________________________________________   LABS (all labs ordered are listed, but only abnormal results are displayed)  Labs Reviewed  BASIC METABOLIC PANEL  HEPATIC FUNCTION PANEL  TROPONIN I  BRAIN NATRIURETIC PEPTIDE  PROTIME-INR  URINALYSIS, COMPLETE (UACMP) WITH MICROSCOPIC  CYTOLOGY - NON PAP    Pending __________________________________________  EKG  ED ECG REPORT I, Darel Hong, the attending physician, personally viewed and interpreted this ECG.  Date: 07/08/2017  Rate: 105 Rhythm: Sinus tachycardia QRS Axis: normal Intervals: normal ST/T Wave abnormalities: normal Narrative Interpretation: no signs of acute ischemia early transition in her R waves abnormal EKG   ____________________________________________  RADIOLOGY  Chest x-ray with moderate left-sided pleural effusion with mediastinal shift ____________________________________________   PROCEDURES  Procedure(s) performed:  no  Procedures  Critical Care performed: no  Observation: no ____________________________________________   INITIAL IMPRESSION / ASSESSMENT AND PLAN / ED COURSE  Pertinent labs & imaging results that were available during my care of the patient were reviewed by me and considered in my medical decision making (see chart for details).  The patient is quite short of breath saturating the low to mid 90s on room air which is atypical for her. Chest x-ray shows a large left-sided pleural effusion with mediastinal shift which likewise is progressive from previous. This likely is the etiology of her shortness of breath and she requires IR guided drainage. Bedside ultrasound also shows reaccumulation of her abdominal ascites although her abdomen is not tense and I don't believe she requires an emergent paracentesis at this point.   ____________________________________________   FINAL CLINICAL IMPRESSION(S) / ED DIAGNOSES  Final diagnoses:  Pleural effusion  Shortness of breath  Malignant neoplasm of ovary, unspecified laterality ([place])      NEW MEDICATIONS STARTED DURING THIS VISIT:  New Prescriptions   No medications on file     Note:  This document was prepared using Dragon voice recognition software and may include unintentional dictation errors.     Darel Hong, MD 07/08/17 1500

## 2017-07-08 NOTE — ED Notes (Addendum)
Pt thoracentesis site is clean and dry to the posterior left side.

## 2017-07-08 NOTE — ED Notes (Signed)
ED tech assisted pt to the BR but no specimen was obtained,. Informed pt that we still need a urine specimen and put a hat in the toilet for collection.

## 2017-07-08 NOTE — Procedures (Signed)
Left thoracentesis without difficulty  Complications:  None  Blood Loss: none  See dictation in canopy pacs  

## 2017-07-08 NOTE — H&P (Signed)
[place] [facility] [place] NAME: Kathryn Chandler    MR#:  [id number]  DATE OF BIRTH:  31-Mar-1929  DATE OF ADMISSION:  07/08/2017  PRIMARY CARE PHYSICIAN: Albina Billet, MD   REQUESTING/REFERRING PHYSICIAN: Dr Mable Paris and Dr Dineen Kid  CHIEF COMPLAINT:   Chief Complaint  Patient presents with  . Shortness of Breath    HISTORY OF PRESENT ILLNESS:  Kathryn Chandler  is a 83 y.o. female with a known history of ovarian cancer presents with shortness of breath. She was found to have a pleural fusion. ER physician contacted me for evaluation but the patient was off the floor for thoracentesis. The patient had 1 L of fluid taken off with the thoracentesis. She is breathing much better after the procedure. She is still feeling weak and numb able to take care of herself at home. In the ER, she was found to have a low potassium.  PAST MEDICAL HISTORY:   Past Medical History:  Diagnosis Date  . Anxiety   . Cancer ([place])   . GERD (gastroesophageal reflux disease)   . Glaucoma   . Hypercholesteremia   . Hypertension   . Ovarian cancer ([place]) 06/2015   Bilateral Oophorectomies.   Marland Kitchen PONV (postoperative nausea and vomiting)     PAST SURGICAL HISTORY:   Past Surgical History:  Procedure Laterality Date  . ABDOMINAL HYSTERECTOMY    . BREAST BIOPSY Right 1970's  . EXPLORATORY LAPAROTOMY W/ BOWEL RESECTION  01/11/2016   XL optimal debulking rectosigmoid resection and reanastomosis  . HAND SURGERY     Right  . LAPAROTOMY Left 07/20/2015   Procedure: LAPAROTOMY with Left Salpingooophrectomy;  Surgeon: Honor Loh Ward, MD;  Location: [facility]S;  Service: Gynecology;  Laterality: Left;  . LAPAROTOMY N/A 07/20/2015   Procedure: LAPAROTOMY;  Surgeon: Gillis Ends, MD;  Location: [facility] ORS;  Service: Gynecology;  Laterality: N/A;  . PERIPHERAL VASCULAR CATHETERIZATION N/A 08/10/2015   Procedure: Glori Luis Cath Insertion;  Surgeon: Algernon Huxley, MD;   Location: [facility];  Service: Cardiovascular;  Laterality: N/A;  . REPLACEMENT TOTAL KNEE     Right  . SALPINGOOPHORECTOMY Right 07/20/2015   Procedure: SALPINGO OOPHORECTOMY;  Surgeon: Gillis Ends, MD;  Location: [facility];  Service: Gynecology;  Laterality: Right;    SOCIAL HISTORY:   Social History  Substance Use Topics  . Smoking status: Never Smoker  . Smokeless tobacco: Never Used  . Alcohol use No    FAMILY HISTORY:   Family History  Problem Relation Age of Onset  . Stomach cancer Mother   . Osteoporosis Mother   . Kidney disease Father   . Breast cancer Neg Hx     DRUG ALLERGIES:   Allergies  Allergen Reactions  . Clindamycin/Lincomycin Diarrhea and Other (See Comments)    Reaction:  Antibiotic colitis   . Codeine Anxiety    REVIEW OF SYSTEMS:  CONSTITUTIONAL: No fever. Positive for cold feeling. Positive for fatigue and weakness. Positive for weight loss. EYES: No blurred or double vision. Wears glasses. EARS, NOSE, AND THROAT: No tinnitus or ear pain. No sore throat RESPIRATORY: No cough. Positive for shortness of breath,no wheezing or hemoptysis.  CARDIOVASCULAR: No chest pain, orthopnea, edema.  GASTROINTESTINAL: No nausea, vomiting, or abdominal pain. No blood in bowel movements. Positive for diarrhea last few days. No control over it. GENITOURINARY: No dysuria, hematuria.  ENDOCRINE: No polyuria, nocturia,  HEMATOLOGY: No anemia, easy bruising or bleeding SKIN: No rash  or lesion. MUSCULOSKELETAL: No joint pain or arthritis.   NEUROLOGIC: No tingling, numbness, weakness.  PSYCHIATRY: No anxiety or depression.   MEDICATIONS AT HOME:   Prior to Admission medications   Medication Sig Start Date End Date Taking? Authorizing Provider  bimatoprost (LUMIGAN) 0.01 % SOLN Place 1 drop into both eyes at bedtime.   Yes [provider]  dexamethasone (DECADRON) 4 MG tablet Take 1 tablet (4 mg total) by mouth daily. 07/03/17  Yes  Lloyd Huger, MD  LORazepam (ATIVAN) 0.5 MG tablet Take 0.5 mg by mouth 2 (two) times daily.    Yes [provider]  meloxicam (MOBIC) 15 MG tablet Take 15 mg by mouth daily.    Yes [provider]  omeprazole (PRILOSEC) 20 MG capsule Take 20 mg by mouth daily.    Yes [provider]  simvastatin (ZOCOR) 20 MG tablet Take 20 mg by mouth at bedtime.    Yes [provider]  acetaminophen (TYLENOL) 500 MG tablet Take 500-1,000 mg by mouth every 6 (six) hours as needed for mild pain, fever or headache.     [provider]  fluticasone (FLONASE) 50 MCG/ACT nasal spray Place 2 sprays into both nostrils daily as needed for rhinitis.     [provider]  lidocaine-prilocaine (EMLA) cream Apply 1 application topically as needed (prior to accessing port).    [provider]  ondansetron (ZOFRAN) 8 MG tablet Take 1 tablet (8 mg total) by mouth every 8 (eight) hours as needed for nausea or vomiting. 07/03/17   Lloyd Huger, MD      VITAL SIGNS:  Blood pressure (!) 163/82, pulse (!) 119, temperature 97.9 F (36.6 C), temperature source Oral, resp. rate 20, height 4\' 11"  (1.499 m), weight 52.2 kg (115 lb), SpO2 97 %.  PHYSICAL EXAMINATION:  GENERAL:  83 y.o.-year-old patient lying in the bed with no acute distress.  EYES: Pupils equal, round, reactive to light and accommodation. No scleral icterus. Extraocular muscles intact.  HEENT: Head atraumatic, normocephalic. Oropharynx and nasopharynx clear.  NECK:  Supple, no jugular venous distention. No thyroid enlargement, no tenderness.  LUNGS: decrease breath sounds bilateral bases, no wheezing, rales,rhonchi or crepitation. No use of accessory muscles of respiration.  CARDIOVASCULAR: S1, S2 tachycardia. No murmurs, rubs, or gallops.  ABDOMEN: Soft, nontender, distended. Bowel sounds present. No organomegaly or mass.  EXTREMITIES: trace edema, nocyanosis, or clubbing.  NEUROLOGIC:  Cranial nerves II through XII are intact. Muscle strength 5/5 in all extremities. Sensation intact. Gait not checked.  PSYCHIATRIC: The patient is alert and oriented x 3.  SKIN: No rash, lesion, or ulcer.   LABORATORY PANEL:   CBC  Recent Labs Lab 07/03/17 0939  WBC 9.7  HGB 12.4  HCT 36.9  PLT 373   ------------------------------------------------------------------------------------------------------------------  Chemistries   Recent Labs Lab 07/08/17 1446  NA 138  K 2.8*  CL 104  CO2 25  GLUCOSE 101*  BUN 18  CREATININE 0.71  CALCIUM 8.4*  AST 55*  ALT 34  ALKPHOS 99  BILITOT 0.8   ------------------------------------------------------------------------------------------------------------------  Cardiac Enzymes  Recent Labs Lab 07/08/17 1446  TROPONINI <0.03   ------------------------------------------------------------------------------------------------------------------  RADIOLOGY:  Dg Chest Port 1 View  Result Date: 07/08/2017 CLINICAL DATA:  Status post left thoracentesis EXAM: PORTABLE CHEST 1 VIEW COMPARISON:  Film from earlier in the same day FINDINGS: There is been near complete reduction of the left pleural effusion when compare with the prior exam. No pneumothorax is noted. Mild interstitial  changes in bibasilar atelectatic changes are seen. Right chest wall port is noted in satisfactory position. Cardiac shadow is stable. Aortic calcifications are seen. No bony abnormality is noted. IMPRESSION: Status post left thoracentesis without evidence of pneumothorax. Electronically Signed   By: Inez Catalina M.D.   On: 07/08/2017 15:53   Dg Chest Port 1 View  Result Date: 07/08/2017 CLINICAL DATA:  Short of breath EXAM: PORTABLE CHEST 1 VIEW COMPARISON:  04/17/2016 FINDINGS: Stable right jugular Port-A-Cath. Heart is moderately enlarged. Left pleural effusion has developed. There is shift of the mediastinum to the right. Right lung is clear. No pneumothorax.  IMPRESSION: New moderate left pleural effusion with shift of the mediastinum to the right. Electronically Signed   By: Marybelle Killings M.D.   On: 07/08/2017 14:28   US Thoracentesis Asp Pleural Space W/img Guide  Result Date: 07/08/2017 INDICATION: Left pleural effusion EXAM: ULTRASOUND GUIDED LEFT THORACENTESIS MEDICATIONS: None. COMPLICATIONS: None immediate. PROCEDURE: An ultrasound guided thoracentesis was thoroughly discussed with the patient and questions answered. The benefits, risks, alternatives and complications were also discussed. The patient understands and wishes to proceed with the procedure. Written consent was obtained. Ultrasound was performed to localize and mark an adequate pocket of fluid in the left chest. The area was then prepped and draped in the normal sterile fashion. 1% Lidocaine was used for local anesthesia. Under ultrasound guidance a 6 Fr Safe-T-Centesis catheter was introduced. Thoracentesis was performed. The catheter was removed and a dressing applied. FINDINGS: A total of approximately 1 L of clear yellow fluid was removed. Samples were sent to the laboratory as requested by the clinical team. IMPRESSION: Successful ultrasound guided left thoracentesis yielding 1 L of pleural fluid. Electronically Signed   By: Inez Catalina M.D.   On: 07/08/2017 15:53    EKG:   Sinus tachycardia 105 bpm  IMPRESSION AND PLAN:   1. Left pleural effusion. Thoracentesis drew off 1 L. Likely this is secondary to ovarian cancer. 2. Recurrent ovarian cancer. Oncology consultation. Patient is a DO NOT RESUSCITATE. Overall prognosis poor. 3. Hypokalemia. Replace magnesium and potassium IV. Also replace potassium orally. Recheck levels tomorrow. 4. Weakness physical therapy evaluation. Family interested in rehabilitation. Social Engineer, maintenance (IT). 5. Glaucoma continue eyedrops 6. Hyperlipidemia unspecified stop statin which could be contributing to weakness 7. GERD on PPI 8. Diarrhea send  off stool studies  All the records are reviewed and case discussed with ED provider. Management plans discussed with the patient, family and they are in agreement.  CODE STATUS: DO NOT RESUSCITATE  TOTAL TIME TAKING CARE OF THIS PATIENT: 50 minutes.    Loletha Grayer M.D on 07/08/2017 at 5:02 PM  Between 7am to 6pm - Pager - [phone]  After 6pm call admission pager [phone]  [facility] Office  [phone]  CC: Primary care physician; Albina Billet, MD

## 2017-07-08 NOTE — ED Notes (Signed)
Pt c/o increased SOB for the past week. Pt is in NAD.Marland Kitchen No noted respiratory distress on arrival.

## 2017-07-08 NOTE — ED Triage Notes (Signed)
Pt arrived via EMS from home for reports of SOB and weakness over the past two days. EMS reports 99.7 oral temp, 112 HR, CBG 106. Pt alert and oriented on arrival.

## 2017-07-09 DIAGNOSIS — J90 Pleural effusion, not elsewhere classified: Secondary | ICD-10-CM

## 2017-07-09 DIAGNOSIS — I10 Essential (primary) hypertension: Secondary | ICD-10-CM

## 2017-07-09 DIAGNOSIS — R0602 Shortness of breath: Secondary | ICD-10-CM

## 2017-07-09 DIAGNOSIS — R971 Elevated cancer antigen 125 [CA 125]: Secondary | ICD-10-CM

## 2017-07-09 DIAGNOSIS — K219 Gastro-esophageal reflux disease without esophagitis: Secondary | ICD-10-CM

## 2017-07-09 DIAGNOSIS — R5383 Other fatigue: Secondary | ICD-10-CM

## 2017-07-09 DIAGNOSIS — E7800 Pure hypercholesterolemia, unspecified: Secondary | ICD-10-CM

## 2017-07-09 DIAGNOSIS — Z800 Family history of malignant neoplasm of digestive organs: Secondary | ICD-10-CM

## 2017-07-09 DIAGNOSIS — R531 Weakness: Secondary | ICD-10-CM

## 2017-07-09 DIAGNOSIS — Z79899 Other long term (current) drug therapy: Secondary | ICD-10-CM

## 2017-07-09 DIAGNOSIS — F419 Anxiety disorder, unspecified: Secondary | ICD-10-CM

## 2017-07-09 DIAGNOSIS — C569 Malignant neoplasm of unspecified ovary: Secondary | ICD-10-CM

## 2017-07-09 LAB — CBC
HEMATOCRIT: 34.8 % — AB (ref 35.0–47.0)
Hemoglobin: 11.8 g/dL — ABNORMAL LOW (ref 12.0–16.0)
MCH: 28.3 pg (ref 26.0–34.0)
MCHC: 33.8 g/dL (ref 32.0–36.0)
MCV: 83.7 fL (ref 80.0–100.0)
Platelets: 306 10*3/uL (ref 150–440)
RBC: 4.16 MIL/uL (ref 3.80–5.20)
RDW: 15.1 % — AB (ref 11.5–14.5)
WBC: 9.8 10*3/uL (ref 3.6–11.0)

## 2017-07-09 LAB — BASIC METABOLIC PANEL
Anion gap: 5 (ref 5–15)
BUN: 14 mg/dL (ref 6–20)
CHLORIDE: 106 mmol/L (ref 101–111)
CO2: 27 mmol/L (ref 22–32)
Calcium: 8.3 mg/dL — ABNORMAL LOW (ref 8.9–10.3)
Creatinine, Ser: 0.88 mg/dL (ref 0.44–1.00)
GFR calc Af Amer: >60 mL/min (ref >60)
GFR calc non Af Amer: 58 mL/min — ABNORMAL LOW (ref >60)
Glucose, Bld: 91 mg/dL (ref 65–99)
POTASSIUM: 3.9 mmol/L (ref 3.5–5.1)
SODIUM: 138 mmol/L (ref 135–145)

## 2017-07-09 LAB — URINALYSIS, COMPLETE (UACMP) WITH MICROSCOPIC
Bilirubin Urine: NEGATIVE
Glucose, UA: NEGATIVE mg/dL
Hgb urine dipstick: NEGATIVE
Ketones, ur: NEGATIVE mg/dL
LEUKOCYTES UA: NEGATIVE
NITRITE: NEGATIVE
PH: 5 (ref 5.0–8.0)
Protein, ur: NEGATIVE mg/dL
SPECIFIC GRAVITY, URINE: 1.006 (ref 1.005–1.030)

## 2017-07-09 NOTE — Progress Notes (Signed)
[place] at [place] NAME: Kathryn Chandler    MR#:  [id number]  DATE OF BIRTH:  10-28-1929  SUBJECTIVE:  Patient presented with increasing weakness and shortness of breath. She is status post thoracentesis. On better. Weak today.  REVIEW OF SYSTEMS:   Review of Systems  Constitutional: Negative for chills, fever and weight loss.  HENT: Negative for ear discharge, ear pain and nosebleeds.   Eyes: Negative for blurred vision, pain and discharge.  Respiratory: Positive for shortness of breath. Negative for sputum production, wheezing and stridor.   Cardiovascular: Negative for chest pain, palpitations, orthopnea and PND.  Gastrointestinal: Negative for abdominal pain, diarrhea, nausea and vomiting.  Genitourinary: Negative for frequency and urgency.  Musculoskeletal: Negative for back pain and joint pain.  Neurological: Positive for weakness. Negative for sensory change, speech change and focal weakness.  Psychiatric/Behavioral: Negative for depression and hallucinations. The patient is not nervous/anxious.    Tolerating Diet:yes Tolerating PT: pending  DRUG ALLERGIES:   Allergies  Allergen Reactions  . Clindamycin/Lincomycin Diarrhea and Other (See Comments)    Reaction:  Antibiotic colitis   . Codeine Anxiety    VITALS:  Blood pressure 127/71, pulse 100, temperature 98.2 F (36.8 C), temperature source Oral, resp. rate 20, height 4\' 11"  (1.499 m), weight 52.2 kg (115 lb), SpO2 96 %.  PHYSICAL EXAMINATION:   Physical Exam  GENERAL:  83 y.o.-year-old patient lying in the bed with no acute distress. Weak,fraile EYES: Pupils equal, round, reactive to light and accommodation. No scleral icterus. Extraocular muscles intact.  HEENT: Head atraumatic, normocephalic. Oropharynx and nasopharynx clear.  NECK:  Supple, no jugular venous distention. No thyroid enlargement, no tenderness.  LUNGS: decreased breath sounds bilaterally, no  wheezing, rales, rhonchi. No use of accessory muscles of respiration.  CARDIOVASCULAR: S1, S2 normal. No murmurs, rubs, or gallops.  ABDOMEN: Soft, nontender, nondistended. Bowel sounds present. No organomegaly or mass.  EXTREMITIES: No cyanosis, clubbing or edema b/l.    NEUROLOGIC: Cranial nerves II through XII are intact. No focal Motor or sensory deficits b/l.   PSYCHIATRIC:  patient is alert and oriented x 3.  SKIN: No obvious rash, lesion, or ulcer.   LABORATORY PANEL:  CBC  Recent Labs Lab 07/09/17 0609  WBC 9.8  HGB 11.8*  HCT 34.8*  PLT 306    Chemistries   Recent Labs Lab 07/08/17 1446 07/09/17 0609  NA 138 138  K 2.8* 3.9  CL 104 106  CO2 25 27  GLUCOSE 101* 91  BUN 18 14  CREATININE 0.71 0.88  CALCIUM 8.4* 8.3*  AST 55*  --   ALT 34  --   ALKPHOS 99  --   BILITOT 0.8  --    Cardiac Enzymes  Recent Labs Lab 07/08/17 1446  TROPONINI <0.03   RADIOLOGY:  Dg Chest Port 1 View  Result Date: 07/08/2017 CLINICAL DATA:  Status post left thoracentesis EXAM: PORTABLE CHEST 1 VIEW COMPARISON:  Film from earlier in the same day FINDINGS: There is been near complete reduction of the left pleural effusion when compare with the prior exam. No pneumothorax is noted. Mild interstitial changes in bibasilar atelectatic changes are seen. Right chest wall port is noted in satisfactory position. Cardiac shadow is stable. Aortic calcifications are seen. No bony abnormality is noted. IMPRESSION: Status post left thoracentesis without evidence of pneumothorax. Electronically Signed   By: Inez Catalina M.D.   On: 07/08/2017 15:53   Dg Chest Port 1  View  Result Date: 07/08/2017 CLINICAL DATA:  Short of breath EXAM: PORTABLE CHEST 1 VIEW COMPARISON:  04/17/2016 FINDINGS: Stable right jugular Port-A-Cath. Heart is moderately enlarged. Left pleural effusion has developed. There is shift of the mediastinum to the right. Right lung is clear. No pneumothorax. IMPRESSION: New moderate  left pleural effusion with shift of the mediastinum to the right. Electronically Signed   By: Marybelle Killings M.D.   On: 07/08/2017 14:28   US Thoracentesis Asp Pleural Space W/img Guide  Result Date: 07/08/2017 INDICATION: Left pleural effusion EXAM: ULTRASOUND GUIDED LEFT THORACENTESIS MEDICATIONS: None. COMPLICATIONS: None immediate. PROCEDURE: An ultrasound guided thoracentesis was thoroughly discussed with the patient and questions answered. The benefits, risks, alternatives and complications were also discussed. The patient understands and wishes to proceed with the procedure. Written consent was obtained. Ultrasound was performed to localize and mark an adequate pocket of fluid in the left chest. The area was then prepped and draped in the normal sterile fashion. 1% Lidocaine was used for local anesthesia. Under ultrasound guidance a 6 Fr Safe-T-Centesis catheter was introduced. Thoracentesis was performed. The catheter was removed and a dressing applied. FINDINGS: A total of approximately 1 L of clear yellow fluid was removed. Samples were sent to the laboratory as requested by the clinical team. IMPRESSION: Successful ultrasound guided left thoracentesis yielding 1 L of pleural fluid. Electronically Signed   By: Inez Catalina M.D.   On: 07/08/2017 15:53   ASSESSMENT AND PLAN:   Kathryn Chandler  is a 83 y.o. female with a known history of ovarian cancer presents with shortness of breath. She was found to have a pleural fusion. ER physician contacted me for evaluation but the patient was off the floor for thoracentesis. The patient had 1 L of fluid taken off with the thoracentesis  1. Left pleural effusion-new. Thoracentesis drew off 1 L. Likely this is secondary to ovarian cancer. -patient had paracentesis done a few weeks ago as well. -Sats more than 92% on room air  2. Recurrent ovarian cancer. Oncology consultation - Patient is a DO NOT RESUSCITATE. Overall prognosis poor. -she is supposed to be  started on palliative chemotherapy.  3. Hypokalemia. Replace magnesium and potassium IV. Also replace potassium orally.   4. Weakness physical therapy evaluation. Family interested in rehabilitation. Social Engineer, maintenance (IT).  5. Glaucoma continue eyedrops  6. Hyperlipidemia unspecified stop statin which could be contributing to weakness  7. GERD on PPI  8. Diarrhea send off stool studies No more diarrhea reported.  PT to see patient. She has deconditioned quite a bit. She'll benefit from rehabilitation if appropriate.  Case discussed with Care Management/Social Worker. Management plans discussed with the patient, family and they are in agreement.  CODE STATUS: DNR  DVT Prophylaxis: Lovenox  TOTAL TIME TAKING CARE OF THIS PATIENT: *40* minutes.  >50% time spent on counselling and coordination of care  POSSIBLE D/C IN 1-2 DAYS, DEPENDING ON CLINICAL CONDITION.  Note: This dictation was prepared with Dragon dictation along with smaller phrase technology. Any transcriptional errors that result from this process are unintentional.  Manahil Vanzile M.D on 07/09/2017 at 11:37 AM  Between 7am to 6pm - Pager - [phone]  After 6pm go to [email or web address] - password [email or web address]  Office  [phone]  CC: Primary care physician; Albina Billet, MD

## 2017-07-09 NOTE — Clinical Social Work Note (Signed)
Clinical Social Work Assessment  Patient Details  Name: Kathryn Chandler MRN: [id number] Date of Birth: 1929-02-27  Date of referral:  07/09/17               Reason for consult:  Facility Placement                Permission sought to share information with:  Family Supports, Customer service manager Permission granted to share information::  Yes, Verbal Permission Granted  Name::     Dorethea Clan [phone]  [phone] or Jocelyn Lamer [phone]  [phone]   Agency::  SNF admissions  Relationship::     Contact Information:     Housing/Transportation Living arrangements for the past 2 months:  Single Family Home Source of Information:  Patient Patient Interpreter Needed:  None Criminal Activity/Legal Involvement Pertinent to Current Situation/Hospitalization:  No - Comment as needed Significant Relationships:  Adult Children Lives with:  Self Do you feel safe going back to the place where you live?  No Need for family participation in patient care:  No (Coment)  Care giving concerns:  Patient feels she needs some short term rehab before she is able to return back home.   Social Worker assessment / plan:  Patient is a 83 year old female who lives alone in a house.  Patient is alert and oriented x4 and family is involved in providing care.  Patient has been to SNF before, and she was familiar with the process of going to SNF and what to expect.  Patient was explained how insurance will pay for stay, patient informed CSW that she would like to go to [facility] again if possible.  CSW explained that if [facility] does not have any beds, she will have to consider going to a different facility before she is able to return back home.  Patient expressed understanding, she did not have any other question or concerns and gave CSW permission to begin bed search process.   Employment status:  Retired Nurse, adult PT Recommendations:  [place] / Referral to community resources:  [place]   Patient/Family's Response to care:  Patient is in agreement to going to SNF for short term rehab.  Patient/Family's Understanding of and Emotional Response to Diagnosis, Current Treatment, and Prognosis:  Patient is hopeful that she will not have to be at SNF for very long and will be able to get back home after receiving some therapy.  Emotional Assessment Appearance:  Appears stated age Attitude/Demeanor/Rapport:    Affect (typically observed):  Appropriate, Calm Orientation:  Oriented to Self, Oriented to Place, Oriented to  Time, Oriented to Situation Alcohol / Substance use:  Not Applicable Psych involvement (Current and /or in the community):     Discharge Needs  Concerns to be addressed:  Lack of Support Readmission within the last 30 days:  No Current discharge risk:  Lack of support system Barriers to Discharge:  Continued Medical Work up   Anell Barr 07/09/2017, 4:19 PM

## 2017-07-09 NOTE — Consult Note (Signed)
[facility]  [phone] [phone]  ID: [name] OB: [birth year]  MR#: [id number]  QAS#:[id number]  Patient Care Team: Albina Billet, MD as PCP - General (Internal Medicine) Gillis Ends, MD as Referring Physician (Obstetrics and Gynecology) Ward, Honor Loh, MD as Referring Physician (Obstetrics and Gynecology) Lloyd Huger, MD as Consulting Physician (Oncology)  CHIEF COMPLAINT: Recurrent ovarian cancer, shortness of breath secondary to pleural effusion.  INTERVAL HISTORY: Patient is an 83 year old female with recurrent ovarian cancer who presented to the emergency room with acute onset shortness of breath. She was found to have a large left pleural effusion and subsequently had a liter of fluid removed with thoracentesis. She is now symptomatically improved. She has chronic weakness and fatigue. She denies any chest pain. She has a fair appetite, but denies weight loss. She has no nausea, vomiting, constipation, or diarrhea. She denies any abdominal bloating.  She has no urinary complaints. Patient offers no specific complaints today.  REVIEW OF SYSTEMS:   Review of Systems  Constitutional: Positive for malaise/fatigue. Negative for fever and weight loss.  Respiratory: Negative.  Negative for cough, hemoptysis and shortness of breath.   Cardiovascular: Negative.  Negative for chest pain and leg swelling.  Gastrointestinal: Negative.  Negative for abdominal pain.  Genitourinary: Negative.   Musculoskeletal: Negative.   Skin: Negative.  Negative for rash.  Neurological: Positive for weakness.  Psychiatric/Behavioral: Negative.  The patient is not nervous/anxious.     As per HPI. Otherwise, a complete review of systems is negative.  PAST MEDICAL HISTORY: Past Medical History:  Diagnosis Date  . Anxiety   . Cancer ([place])   . GERD (gastroesophageal reflux disease)   . Glaucoma   . Hypercholesteremia   . Hypertension     . Ovarian cancer ([place]) 06/2015   Bilateral Oophorectomies.   Marland Kitchen PONV (postoperative nausea and vomiting)     PAST SURGICAL HISTORY: Past Surgical History:  Procedure Laterality Date  . ABDOMINAL HYSTERECTOMY    . BREAST BIOPSY Right 1970's  . EXPLORATORY LAPAROTOMY W/ BOWEL RESECTION  01/11/2016   XL optimal debulking rectosigmoid resection and reanastomosis  . HAND SURGERY     Right  . LAPAROTOMY Left 07/20/2015   Procedure: LAPAROTOMY with Left Salpingooophrectomy;  Surgeon: Honor Loh Ward, MD;  Location: [facility] ORS;  Service: Gynecology;  Laterality: Left;  . LAPAROTOMY N/A 07/20/2015   Procedure: LAPAROTOMY;  Surgeon: Gillis Ends, MD;  Location: [facility] ORS;  Service: Gynecology;  Laterality: N/A;  . PERIPHERAL VASCULAR CATHETERIZATION N/A 08/10/2015   Procedure: Glori Luis Cath Insertion;  Surgeon: Algernon Huxley, MD;  Location: [facility];  Service: Cardiovascular;  Laterality: N/A;  . REPLACEMENT TOTAL KNEE     Right  . SALPINGOOPHORECTOMY Right 07/20/2015   Procedure: SALPINGO OOPHORECTOMY;  Surgeon: Gillis Ends, MD;  Location: [facility] OR[facility];  Service: Gynecology;  Laterality: Right;    FAMILY HISTORY: Family History  Problem Relation Age of Onset  . Stomach cancer Mother   . Osteoporosis Mother   . Kidney disease Father   . Breast cancer Neg Hx     ADVANCED DIRECTIVES (Y/N):  @ADVDIR @  HEALTH MAINTENANCE: Social History  Substance Use Topics  . Smoking status: Never Smoker  . Smokeless tobacco: Never Used  . Alcohol use No     Colonoscopy:  PAP:  Bone density:  Lipid panel:  Allergies  Allergen Reactions  . Clindamycin/Lincomycin Diarrhea and Other (See Comments)    Reaction:  Antibiotic  colitis   . Codeine Anxiety    Current Facility-Administered Medications  Medication Dose Route Frequency Provider Last Rate Last Dose  . acetaminophen (TYLENOL) tablet 650 mg  650 mg Oral Q6H PRN Loletha Grayer, MD       Or  . acetaminophen (TYLENOL)  suppository 650 mg  650 mg Rectal Q6H PRN Wieting, Richard, MD      . dexamethasone (DECADRON) tablet 4 mg  4 mg Oral Daily Loletha Grayer, MD   4 mg at 07/09/17 0925  . enoxaparin (LOVENOX) injection 40 mg  40 mg Subcutaneous Q24H Loletha Grayer, MD   40 mg at 07/08/17 1732  . fluticasone (FLONASE) 50 MCG/ACT nasal spray 2 spray  2 spray Each Nare Daily PRN Wieting, Richard, MD      . latanoprost (XALATAN) 0.005 % ophthalmic solution 1 drop  1 drop Both Eyes QHS Loletha Grayer, MD   1 drop at 07/08/17 2117  . LORazepam (ATIVAN) tablet 0.5 mg  0.5 mg Oral BID Loletha Grayer, MD   0.5 mg at 07/09/17 0925  . meloxicam (MOBIC) tablet 15 mg  15 mg Oral Daily Loletha Grayer, MD   15 mg at 07/09/17 0925  . ondansetron (ZOFRAN) tablet 8 mg  8 mg Oral Q8H PRN Wieting, Richard, MD      . pantoprazole (PROTONIX) EC tablet 40 mg  40 mg Oral Daily Loletha Grayer, MD   40 mg at 07/09/17 0925  . sodium chloride flush (NS) 0.9 % injection 10-40 mL  10-40 mL Intracatheter PRN Loletha Grayer, MD       Facility-Administered Medications Ordered in Other Encounters  Medication Dose Route Frequency Provider Last Rate Last Dose  . diphenhydrAMINE (BENADRYL) injection 12.5 mg  12.5 mg Intravenous Once Choksi, Delorise Shiner, MD      . methylPREDNISolone sodium succinate (SOLU-MEDROL) 125 mg/2 mL injection 100 mg  100 mg Intravenous Once Choksi, Janak, MD      . sodium chloride flush (NS) 0.9 % injection 10 mL  10 mL Intravenous PRN Charlaine Dalton R, MD   10 mL at 05/20/17 1227    OBJECTIVE: Vitals:   07/09/17 0925 07/09/17 1300  BP: 127/71 122/76  Pulse: 100 89  Resp: 20 20  Temp: 98.2 F (36.8 C) 98.4 F (36.9 C)  SpO2: 96% 98%     Body mass index is 23.23 kg/m.    ECOG FS:1 - Symptomatic but completely ambulatory  General: Well-developed, well-nourished, no acute distress. Eyes: Pink conjunctiva, anicteric sclera. HEENT: Normocephalic, moist mucous membranes, clear oropharnyx. Lungs: Clear to  auscultation bilaterally. Heart: Regular rate and rhythm. No rubs, murmurs, or gallops. Abdomen: Soft, nontender, nondistended. No organomegaly noted, normoactive bowel sounds. Musculoskeletal: No edema, cyanosis, or clubbing. Neuro: Alert, answering all questions appropriately. Cranial nerves grossly intact. Skin: No rashes or petechiae noted. Psych: Normal affect. Lymphatics: No cervical, calvicular, axillary or inguinal LAD.   LAB RESULTS:  Lab Results  Component Value Date   NA 138 07/09/2017   K 3.9 07/09/2017   CL 106 07/09/2017   CO2 27 07/09/2017   GLUCOSE 91 07/09/2017   BUN 14 07/09/2017   CREATININE 0.88 07/09/2017   CALCIUM 8.3 (L) 07/09/2017   PROT 6.6 07/08/2017   ALBUMIN 3.0 (L) 07/08/2017   AST 55 (H) 07/08/2017   ALT 34 07/08/2017   ALKPHOS 99 07/08/2017   BILITOT 0.8 07/08/2017   GFRNONAA 58 (L) 07/09/2017   GFRAA >60 07/09/2017    Lab Results  Component Value Date   WBC  9.8 07/09/2017   NEUTROABS 6.4 07/03/2017   HGB 11.8 (L) 07/09/2017   HCT 34.8 (L) 07/09/2017   MCV 83.7 07/09/2017   PLT 306 07/09/2017     STUDIES: Dg Chest Port 1 View  Result Date: 07/08/2017 CLINICAL DATA:  Status post left thoracentesis EXAM: PORTABLE CHEST 1 VIEW COMPARISON:  Film from earlier in the same day FINDINGS: There is been near complete reduction of the left pleural effusion when compare with the prior exam. No pneumothorax is noted. Mild interstitial changes in bibasilar atelectatic changes are seen. Right chest wall port is noted in satisfactory position. Cardiac shadow is stable. Aortic calcifications are seen. No bony abnormality is noted. IMPRESSION: Status post left thoracentesis without evidence of pneumothorax. Electronically Signed   By: Inez Catalina M.D.   On: 07/08/2017 15:53   Dg Chest Port 1 View  Result Date: 07/08/2017 CLINICAL DATA:  Short of breath EXAM: PORTABLE CHEST 1 VIEW COMPARISON:  04/17/2016 FINDINGS: Stable right jugular Port-A-Cath. Heart  is moderately enlarged. Left pleural effusion has developed. There is shift of the mediastinum to the right. Right lung is clear. No pneumothorax. IMPRESSION: New moderate left pleural effusion with shift of the mediastinum to the right. Electronically Signed   By: Marybelle Killings M.D.   On: 07/08/2017 14:28   US Thoracentesis Asp Pleural Space W/img Guide  Result Date: 07/08/2017 INDICATION: Left pleural effusion EXAM: ULTRASOUND GUIDED LEFT THORACENTESIS MEDICATIONS: None. COMPLICATIONS: None immediate. PROCEDURE: An ultrasound guided thoracentesis was thoroughly discussed with the patient and questions answered. The benefits, risks, alternatives and complications were also discussed. The patient understands and wishes to proceed with the procedure. Written consent was obtained. Ultrasound was performed to localize and mark an adequate pocket of fluid in the left chest. The area was then prepped and draped in the normal sterile fashion. 1% Lidocaine was used for local anesthesia. Under ultrasound guidance a 6 Fr Safe-T-Centesis catheter was introduced. Thoracentesis was performed. The catheter was removed and a dressing applied. FINDINGS: A total of approximately 1 L of clear yellow fluid was removed. Samples were sent to the laboratory as requested by the clinical team. IMPRESSION: Successful ultrasound guided left thoracentesis yielding 1 L of pleural fluid. Electronically Signed   By: Inez Catalina M.D.   On: 07/08/2017 15:53    ASSESSMENT: Recurrent ovarian cancer, shortness of breath secondary to pleural effusion.  PLAN:   1. Pleural effusion: Possibly malignant. Patient also received IV fluids last Wednesday and Friday for dehydration and persistent nausea that may have contributed to her fluid overload. Patient feels symptomatically improved after removal of 1 L fluid. Continue to monitor. 2. Stage IIIc left ovarian cancer: CT scan results from June 06, 2017 reviewed independently with concern for  recurrent disease. Patient also had a paracentesis on the same day removing 2.9 L of peritoneal fluid. Her CA-125 also continues to trend up. Despite declining additional treatment previously, patient has changed her mind and wish to pursue palliative chemotherapy. Patient initially had an appointment on July 10, 2017 for consideration of treatment, but will move this back 1 week to July 17, 2017. Patient has also acknowledged that additional treatments may not be possible, but she expressed interest in pursuing if possible.   Appreciate consult, call with questions.   Lloyd Huger, MD   07/09/2017 1:44 PM

## 2017-07-09 NOTE — Progress Notes (Signed)
Arrived to assist patient to bathroom. Patient stated " I am weaker today than I was yesterday"

## 2017-07-09 NOTE — NC FL2 (Signed)
[place] LEVEL OF CARE SCREENING TOOL     IDENTIFICATION  Patient Name: RUTHA MELGOZA Birthdate: Mar 17, 1929 Sex: female Admission Date (Current Location): 07/08/2017  [place] and [place] Number:  Engineering geologist and Address:  [facility], [address], [place], [place] [address]      Provider Number: [id number]  Attending Physician Name and Address:  Fritzi Mandes, MD  Relative Name and Phone Number:       Current Level of Care: Hospital Recommended Level of Care: [place] Prior Approval Number:    Date Approved/Denied:   PASRR Number: [id number]  Discharge Plan: SNF    Current Diagnoses: Patient Active Problem List   Diagnosis Date Noted  . Pleural effusion 07/08/2017  . Abdominal pain 06/05/2017  . Renal failure 04/17/2016  . Severe protein-calorie malnutrition Altamease Oiler: less than 60% of standard weight) ([place]) 11/07/2015  . Essential (primary) hypertension 11/05/2015  . Colovaginal fistula 11/04/2015  . Acid reflux 08/03/2015  . Bilateral cataracts 08/03/2015  . Chicken pox 08/03/2015  . Allergic state 08/03/2015  . Breast lump 08/03/2015  . Hypercholesteremia 08/03/2015  . Malignant melanoma ([place]) 08/03/2015  . Ovarian cancer ([place]) 07/20/2015  . Head or neck swelling, mass, or lump 08/17/2014    Orientation RESPIRATION BLADDER Height & Weight     Self, Time, Situation, Place  Normal Continent Weight: 115 lb (52.2 kg) Height:  4\' 11"  (149.9 cm)  BEHAVIORAL SYMPTOMS/MOOD NEUROLOGICAL BOWEL NUTRITION STATUS      Continent Diet (Regular Diet)  AMBULATORY STATUS COMMUNICATION OF NEEDS Skin   Limited Assist Verbally Normal                       Personal Care Assistance Level of Assistance  Bathing, Feeding, Dressing Bathing Assistance: Limited assistance Feeding assistance: Independent Dressing Assistance: Limited assistance     Functional Limitations Info  Sight, Hearing, Speech Sight  Info: Adequate Hearing Info: Adequate Speech Info: Adequate    SPECIAL CARE FACTORS FREQUENCY  PT (By licensed PT)     PT Frequency: 5              Contractures Contractures Info: Not present    Additional Factors Info  Code Status, Allergies Code Status Info: DNR Allergies Info: Clindamycin/lincomycin, Codeine      Isolation Precautions:  Enteric     Current Medications (07/09/2017):  This is the current hospital active medication list Current Facility-Administered Medications  Medication Dose Route Frequency Provider Last Rate Last Dose  . acetaminophen (TYLENOL) tablet 650 mg  650 mg Oral Q6H PRN Loletha Grayer, MD       Or  . acetaminophen (TYLENOL) suppository 650 mg  650 mg Rectal Q6H PRN Wieting, Richard, MD      . dexamethasone (DECADRON) tablet 4 mg  4 mg Oral Daily Loletha Grayer, MD   4 mg at 07/09/17 0925  . enoxaparin (LOVENOX) injection 40 mg  40 mg Subcutaneous Q24H Loletha Grayer, MD   40 mg at 07/08/17 1732  . fluticasone (FLONASE) 50 MCG/ACT nasal spray 2 spray  2 spray Each Nare Daily PRN Wieting, Richard, MD      . latanoprost (XALATAN) 0.005 % ophthalmic solution 1 drop  1 drop Both Eyes QHS Loletha Grayer, MD   1 drop at 07/08/17 2117  . LORazepam (ATIVAN) tablet 0.5 mg  0.5 mg Oral BID Loletha Grayer, MD   0.5 mg at 07/09/17 0925  . meloxicam (MOBIC) tablet 15 mg  15 mg Oral Daily Loletha Grayer, MD   15 mg at 07/09/17 0925  . ondansetron (ZOFRAN) tablet 8 mg  8 mg Oral Q8H PRN Wieting, Richard, MD      . pantoprazole (PROTONIX) EC tablet 40 mg  40 mg Oral Daily Loletha Grayer, MD   40 mg at 07/09/17 0925  . sodium chloride flush (NS) 0.9 % injection 10-40 mL  10-40 mL Intracatheter PRN Loletha Grayer, MD       Facility-Administered Medications Ordered in Other Encounters  Medication Dose Route Frequency Provider Last Rate Last Dose  . diphenhydrAMINE (BENADRYL) injection 12.5 mg  12.5 mg Intravenous Once Choksi, Delorise Shiner, MD      .  methylPREDNISolone sodium succinate (SOLU-MEDROL) 125 mg/2 mL injection 100 mg  100 mg Intravenous Once Choksi, Janak, MD      . sodium chloride flush (NS) 0.9 % injection 10 mL  10 mL Intravenous PRN Cammie Sickle, MD   10 mL at 05/20/17 1227     Discharge Medications: Please see discharge summary for a list of discharge medications.  Relevant Imaging Results:  Relevant Lab Results:   Additional Information SSN:  [id number]  Darden Dates, LCSW

## 2017-07-09 NOTE — Care Management Note (Signed)
Case Management Note  Patient Details  Name: Kathryn Chandler MRN: [id number] Date of Birth: November 07, 1929  Subjective/Objective:   Admitted to [facility] with the diagnosis of pleural effusion. Discharged from this facility 06/06/17. Lives alone. Son is [name] [phone]). Dr. Hall Busing is listed as primary care physician. Prescriptions are filled at [facility] on [address]. [facility] and [facility] in the past. [facility] x 2 in the past. Bedside commode and rolling walker in the home. Decreased appetite.                   Action/Plan:  Physical therapy evaluation completed. Recommending home health and physical therapy in the home. Telephone call to son, [name], to discuss plans. States that he would like a re-evaluation tomorrow. Would like his mother to go to [facility], if possible.    Expected Discharge Date:                  Expected Discharge Plan:     In-House Referral:     Discharge planning Services     Post Acute Care Choice:    Choice offered to:     DME Arranged:    DME Agency:     HH Arranged:    HH Agency:     Status of Service:     If discussed at [employer] [facility] [employer], dates discussed:    Additional Comments:  Shelbie Ammons, [place] Management [phone] 07/09/2017, 2:16 PM

## 2017-07-09 NOTE — Clinical Social Work Note (Signed)
CSW presented bed offers to patient and she chose [facility] .  CSW contacted [facility] and they can accept patient once she is medically ready for discharge and orders have been received.  Jones Broom. [place], MSW, [place]  07/09/2017 5:14 PM

## 2017-07-09 NOTE — Progress Notes (Signed)
PT Cancellation Note  Patient Details Name: KIAYA HALIBURTON MRN: [id number] DOB: Dec 24, 1928   Cancelled Treatment:    Reason Eval/Treat Not Completed: Other (comment) Consult received and chart reviewed. Upon arriving to pt room, pt highly anxious saying, "I don't feel good; I'm nervous." Pt also indecisive about whether she would like to continue eating breakfast or not, and saying she is not capable of working right now because she is weak and just woke up. Pt attempted to redirect pt and encourage participation, however, pt unwilling to participate at this time. Will re-attempt, time permitting.    Bevelyn Ngo 07/09/2017, 8:48 AM

## 2017-07-09 NOTE — Evaluation (Signed)
Physical Therapy Evaluation Patient Details Name: Kathryn Chandler MRN: [id number] DOB: [birth year] Today's Date: 07/09/2017   History of Present Illness  Pt is a 83 yo F, edmitted to acute care on 8/20 with dx of pleural effusion and underwent thoracentesis. Prior to admission, pt ModI with all functional mobility, requiring SPC for amb. PMH: anxiety, GERD, cancer, glaucoma, hypercholesteremia, HTN, ovarian cancer, and PONV.  Clinical Impression  Pt initially refusing treatment, wanting to visit with son and daughter in-law. Upon further encouragement, pt did agree to get up to chair. Pt performs bed mobility and tranfers with supervision, and ambulation with CGA, due to impaired strength, endurance, and balance. Pt requires increased time with functional mobility. Able to amb total of 80 ft, with one rest break after 40 ft to perform seated therex. Pt performs toileting with supervision for clothing and hygiene management. Overall, pt responded well to today's treatment with no adverse affects. Pt would benefit from skilled PT to address the previously mentioned impairments and promote return to PLOF. Based on the previously mentioned impairments, pt would benefit from HHPT, pending d/c.      Follow Up Recommendations Home health PT    Equipment Recommendations  Rolling walker with 5" wheels    Recommendations for Other Services       Precautions / Restrictions Precautions Precautions: Fall Restrictions Weight Bearing Restrictions: No      Mobility  Bed Mobility Overal bed mobility: Needs Assistance Bed Mobility: Supine to Sit     Supine to sit: Supervision     General bed mobility comments: Supervision with supien to sit, requiring increased time to perform task and miin cues on mechancis.   Transfers Overall transfer level: Needs assistance   Transfers: Sit to/from Stand Sit to Stand: Supervision         General transfer comment: Pt supervision with STS, presenting  with good mechancis. Pt with increased time to perform task and min cues with navigating RW to turn and return to sit from stand  Ambulation/Gait Ambulation/Gait assistance: Min guard Ambulation Distance (Feet): 80 Feet Assistive device: Rolling walker (2 wheeled) Gait Pattern/deviations: Step-through pattern     General Gait Details: Pt with good reciprocal gait pattern, using RW for B UE support. Pt with decreased gait speed. Min cues provided for standing close to RW and relaxing B shoulders. Pt with good carryover.   Stairs            Wheelchair Mobility    Modified Rankin (Stroke Patients Only)       Balance Overall balance assessment: Needs assistance Sitting-balance support: Bilateral upper extremity supported;Feet supported Sitting balance-Leahy Scale: Normal Sitting balance - Comments: Normal sitting balance, requiring no cues for mechanics and safety.   Standing balance support: Bilateral upper extremity supported Standing balance-Leahy Scale: Fair Standing balance comment: Pt with fair static standing balance, requiring B UE support from RW and CGA. Pt with increased postural sway and mildly forward flexed initial posture. Pt able to self-correct.                              Pertinent Vitals/Pain Pain Assessment: Faces Faces Pain Scale: Hurts a little bit Pain Location: L hip/buttock Pain Descriptors / Indicators: Aching;Discomfort Pain Intervention(s): Limited activity within patient's tolerance;Monitored during session    [place] expects to be discharged to:: Private residence Living Arrangements: Alone Available Help at Discharge: Family Type of Home: House Home Access: Stairs to  enter Entrance Stairs-Rails: Can reach both;Right;Left Entrance Stairs-Number of Steps: 2 Home Layout: Two level;Able to live on main level with bedroom/bathroom Home Equipment: Kasandra Knudsen - single point      Prior Function Level of Independence:  Independent with assistive device(s)         Comments: Pt ModI with ADL's, amb with SPC. Pt has not been driving for~1 month prior to admission.      Hand Dominance        Extremity/Trunk Assessment   Upper Extremity Assessment Upper Extremity Assessment: Overall WFL for tasks assessed    Lower Extremity Assessment Lower Extremity Assessment: Generalized weakness (MMT to B LE's groslsy 4+/5)       Communication   Communication: No difficulties  Cognition Arousal/Alertness: Awake/alert Behavior During Therapy: WFL for tasks assessed/performed Overall Cognitive Status: Within Functional Limits for tasks assessed                                 General Comments: Pt slightly frustrated, initially not agreeable to therapy, as son and daughter-inlaw had just arrived to visit. Pt agreed to participate upon motivation.       General Comments      Exercises Other Exercises Other Exercises: Seated therex performed to B LE's x10 reps with supervision: marches and LAQ's Other Exercises: toileting: pt requires CGA during transfer and clothing management. Supervision for hygeine.    Assessment/Plan    PT Assessment Patient needs continued PT services  PT Problem List Decreased strength;Decreased activity tolerance;Decreased balance;Decreased mobility;Decreased coordination       PT Treatment Interventions      PT Goals (Current goals can be found in the Care Plan section)  Acute Rehab PT Goals Patient Stated Goal: to get stronger PT Goal Formulation: With patient/family Time For Goal Achievement: 07/23/17 Potential to Achieve Goals: Good    Frequency Min 2X/week   Barriers to discharge        Co-evaluation               AM-PAC PT "6 Clicks" Daily Activity  Outcome Measure Difficulty turning over in bed (including adjusting bedclothes, sheets and blankets)?: A Little Difficulty moving from lying on back to sitting on the side of the bed? : A  Little Difficulty sitting down on and standing up from a chair with arms (e.g., wheelchair, bedside commode, etc,.)?: A Little Help needed moving to and from a bed to chair (including a wheelchair)?: A Little Help needed walking in hospital room?: A Little Help needed climbing 3-5 steps with a railing? : A Lot 6 Click Score: 17    End of Session Equipment Utilized During Treatment: Gait belt Activity Tolerance: Patient tolerated treatment well Patient left: in chair;with call bell/phone within reach;with chair alarm set;with family/visitor present Nurse Communication: Mobility status PT Visit Diagnosis: Unsteadiness on feet (R26.81);Other abnormalities of gait and mobility (R26.89);Muscle weakness (generalized) (M62.81)    Time: 7494-4967 PT Time Calculation (min) (ACUTE ONLY): 27 min   Charges:         PT G Codes:        Oran Rein PT, SPT   Bevelyn Ngo 07/09/2017, 2:15 PM

## 2017-07-10 ENCOUNTER — Inpatient Hospital Stay: Payer: Medicare Other

## 2017-07-10 ENCOUNTER — Inpatient Hospital Stay: Payer: Medicare Other | Admitting: Oncology

## 2017-07-10 LAB — CYTOLOGY - NON PAP

## 2017-07-10 MED ORDER — HEPARIN SOD (PORK) LOCK FLUSH 100 UNIT/ML IV SOLN
500.0000 [IU] | Freq: Once | INTRAVENOUS | Status: DC
  Filled 2017-07-10: qty 5

## 2017-07-10 NOTE — Progress Notes (Signed)
No issues or concerns overnight. Pnt has appeared comfortable with eyes closed on nurses round tonight. No new updates will continue to monitor and assist.

## 2017-07-10 NOTE — Discharge Summary (Signed)
[facility] NAME: Kathryn Chandler    MR#:  [id number]  DATE OF BIRTH:  [birth year]  DATE OF ADMISSION:  07/08/2017 ADMITTING PHYSICIAN: Kathryn Grayer, MD  DATE OF DISCHARGE: 07/10/2017  PRIMARY CARE PHYSICIAN: Kathryn Billet, MD    ADMISSION DIAGNOSIS:  Shortness of breath [R06.02] Pleural effusion [J90] Status post thoracentesis [Z98.890] Malignant neoplasm of ovary, unspecified laterality ([place]) [C56.9]  DISCHARGE DIAGNOSIS:    SECONDARY DIAGNOSIS:   Past Medical History:  Diagnosis Date  . Anxiety   . Cancer ([place])   . GERD (gastroesophageal reflux disease)   . Glaucoma   . Hypercholesteremia   . Hypertension   . Ovarian cancer ([place]) 06/2015   Bilateral Oophorectomies.   Marland Kitchen PONV (postoperative nausea and vomiting)     HOSPITAL COURSE:   RuthWatersonis a 83 y.o.femalewith a known history of ovarian cancer presents with shortness of breath. She was found to have a pleural fusion. ER physician contacted me for evaluation but the patient was off the floor for thoracentesis. The patient had 1 L of fluid taken off with the thoracentesis  1. Left pleural effusion-new.  -Thoracentesis drew off 1 L. Likely this is secondary to ovarian cancer. -patient had paracentesis done a few weeks ago as well. -Sats more than 92% on room air  2. Recurrent ovarian cancer. Oncology consultation - Patient is a DO NOT RESUSCITATE. Overall prognosis poor. -she is supposed to be started on palliative chemotherapy---will be rescheduled by Dr Kathryn Chandler.  3. Hypokalemia. Replaced  4. Weakness physical therapy evaluation. Family interested in rehabilitation.  Pt recommended on 8/22 HHPT---CSW has made arrangement for rehab [facility]  5. Glaucoma continue eyedrops  6. Hyperlipidemia unspecified stop statin which could be contributing to weakness  7. GERD on PPI  8. Diarrhea send off stool studies No more diarrhea  reported.  D/c to rehab today CONSULTS OBTAINED:  Treatment Team:  Kathryn Huger, MD  DRUG ALLERGIES:   Allergies  Allergen Reactions  . Clindamycin/Lincomycin Diarrhea and Other (See Comments)    Reaction:  Antibiotic colitis   . Codeine Anxiety    DISCHARGE MEDICATIONS:   Current Discharge Medication List    CONTINUE these medications which have NOT CHANGED   Details  bimatoprost (LUMIGAN) 0.01 % SOLN Place 1 drop into both eyes at bedtime.    dexamethasone (DECADRON) 4 MG tablet Take 1 tablet (4 mg total) by mouth daily. Qty: 30 tablet, Refills: 1    LORazepam (ATIVAN) 0.5 MG tablet Take 0.5 mg by mouth 2 (two) times daily.     meloxicam (MOBIC) 15 MG tablet Take 15 mg by mouth daily.     omeprazole (PRILOSEC) 20 MG capsule Take 20 mg by mouth daily.     simvastatin (ZOCOR) 20 MG tablet Take 20 mg by mouth at bedtime.     acetaminophen (TYLENOL) 500 MG tablet Take 500-1,000 mg by mouth every 6 (six) hours as needed for mild pain, fever or headache.     fluticasone (FLONASE) 50 MCG/ACT nasal spray Place 2 sprays into both nostrils daily as needed for rhinitis.     lidocaine-prilocaine (EMLA) cream Apply 1 application topically as needed (prior to accessing port).    ondansetron (ZOFRAN) 8 MG tablet Take 1 tablet (8 mg total) by mouth every 8 (eight) hours as needed for nausea or vomiting. Qty: 60 tablet, Refills: 2        If you experience worsening of your admission symptoms,  develop shortness of breath, life threatening emergency, suicidal or homicidal thoughts you must seek medical attention immediately by calling 911 or calling your MD immediately  if symptoms less severe.  You Must read complete instructions/literature along with all the possible adverse reactions/side effects for all the Medicines you take and that have been prescribed to you. Take any new Medicines after you have completely understood and accept all the possible adverse reactions/side  effects.   Please note  You were cared for by a hospitalist during your hospital stay. If you have any questions about your discharge medications or the care you received while you were in the hospital after you are discharged, you can call the unit and asked to speak with the hospitalist on call if the hospitalist that took care of you is not available. Once you are discharged, your primary care physician will handle any further medical issues. Please note that NO REFILLS for any discharge medications will be authorized once you are discharged, as it is imperative that you return to your primary care physician (or establish a relationship with a primary care physician if you do not have one) for your aftercare needs so that they can reassess your need for medications and monitor your lab values. Today   SUBJECTIVE   Doing ok  VITAL SIGNS:  Blood pressure 127/76, pulse (!) 101, temperature 98 F (36.7 C), resp. rate 20, height 4\' 11"  (1.499 m), weight 52.2 kg (115 lb), SpO2 96 %.  I/O:   Intake/Output Summary (Last 24 hours) at 07/10/17 0744 Last data filed at 07/09/17 1904  Gross per 24 hour  Intake                0 ml  Output              400 ml  Net             -400 ml    PHYSICAL EXAMINATION:  GENERAL:  83 y.o.-year-old patient lying in the bed with no acute distress.  EYES: Pupils equal, round, reactive to light and accommodation. No scleral icterus. Extraocular muscles intact.  HEENT: Head atraumatic, normocephalic. Oropharynx and nasopharynx clear.  NECK:  Supple, no jugular venous distention. No thyroid enlargement, no tenderness.  LUNGS: Normal breath sounds bilaterally, no wheezing, rales,rhonchi or crepitation. No use of accessory muscles of respiration.  CARDIOVASCULAR: S1, S2 normal. No murmurs, rubs, or gallops.  ABDOMEN: Soft, non-tender, non-distended. Bowel sounds present. No organomegaly or mass.  EXTREMITIES: No pedal edema, cyanosis, or clubbing.  NEUROLOGIC:  Cranial nerves II through XII are intact. Muscle strength 5/5 in all extremities. Sensation intact. Gait not checked.  PSYCHIATRIC: The patient is alert and oriented x 3.  SKIN: No obvious rash, lesion, or ulcer.   DATA REVIEW:   CBC   Recent Labs Lab 07/09/17 0609  WBC 9.8  HGB 11.8*  HCT 34.8*  PLT 306    Chemistries   Recent Labs Lab 07/08/17 1446 07/09/17 0609  NA 138 138  K 2.8* 3.9  CL 104 106  CO2 25 27  GLUCOSE 101* 91  BUN 18 14  CREATININE 0.71 0.88  CALCIUM 8.4* 8.3*  AST 55*  --   ALT 34  --   ALKPHOS 99  --   BILITOT 0.8  --     Microbiology Results   Recent Results (from the past 240 hour(s))  Body fluid culture     Status: None (Preliminary result)   Collection Time: 07/08/17  3:20 PM  Result Value Ref Range Status   Specimen Description PLEURAL  Final   Special Requests NONE  Final   Gram Stain   Final    ABUNDANT WBC PRESENT, PREDOMINANTLY MONONUCLEAR NO ORGANISMS SEEN    Culture   Final    NO GROWTH < 12 HOURS Performed at [facility], [address] [place], [place] [address]    Report Status PENDING  Incomplete    RADIOLOGY:  Dg Chest Port 1 View  Result Date: 07/08/2017 CLINICAL DATA:  Status post left thoracentesis EXAM: PORTABLE CHEST 1 VIEW COMPARISON:  Film from earlier in the same day FINDINGS: There is been near complete reduction of the left pleural effusion when compare with the prior exam. No pneumothorax is noted. Mild interstitial changes in bibasilar atelectatic changes are seen. Right chest wall port is noted in satisfactory position. Cardiac shadow is stable. Aortic calcifications are seen. No bony abnormality is noted. IMPRESSION: Status post left thoracentesis without evidence of pneumothorax. Electronically Signed   By: Inez Catalina M.D.   On: 07/08/2017 15:53   Dg Chest Port 1 View  Result Date: 07/08/2017 CLINICAL DATA:  Short of breath EXAM: PORTABLE CHEST 1 VIEW COMPARISON:  04/17/2016 FINDINGS: Stable  right jugular Port-A-Cath. Heart is moderately enlarged. Left pleural effusion has developed. There is shift of the mediastinum to the right. Right lung is clear. No pneumothorax. IMPRESSION: New moderate left pleural effusion with shift of the mediastinum to the right. Electronically Signed   By: Marybelle Killings M.D.   On: 07/08/2017 14:28   US Thoracentesis Asp Pleural Space W/img Guide  Result Date: 07/08/2017 INDICATION: Left pleural effusion EXAM: ULTRASOUND GUIDED LEFT THORACENTESIS MEDICATIONS: None. COMPLICATIONS: None immediate. PROCEDURE: An ultrasound guided thoracentesis was thoroughly discussed with the patient and questions answered. The benefits, risks, alternatives and complications were also discussed. The patient understands and wishes to proceed with the procedure. Written consent was obtained. Ultrasound was performed to localize and mark an adequate pocket of fluid in the left chest. The area was then prepped and draped in the normal sterile fashion. 1% Lidocaine was used for local anesthesia. Under ultrasound guidance a 6 Fr Safe-T-Centesis catheter was introduced. Thoracentesis was performed. The catheter was removed and a dressing applied. FINDINGS: A total of approximately 1 L of clear yellow fluid was removed. Samples were sent to the laboratory as requested by the clinical team. IMPRESSION: Successful ultrasound guided left thoracentesis yielding 1 L of pleural fluid. Electronically Signed   By: Inez Catalina M.D.   On: 07/08/2017 15:53     Management plans discussed with the patient, family and they are in agreement.  CODE STATUS:     Code Status Orders        Start     Ordered   07/08/17 1700  Do not attempt resuscitation (DNR)  Continuous    Question Answer Comment  In the event of cardiac or respiratory ARREST Do not call a "code blue"   In the event of cardiac or respiratory ARREST Do not perform Intubation, CPR, defibrillation or ACLS   In the event of cardiac or  respiratory ARREST Use medication by any route, position, wound care, and other measures to relive pain and suffering. May use oxygen, suction and manual treatment of airway obstruction as needed for comfort.   Comments nurse may pronounce      07/08/17 1659    Code Status History    Date Active Date Inactive Code Status  Order ID Comments User Context   06/05/2017  5:21 PM 06/07/2017  1:50 PM Full Code [id number]  Demetrios Loll, MD Inpatient   04/17/2016  6:06 PM 04/20/2016  6:16 PM Full Code [id number]  Fritzi Mandes, MD Inpatient   11/04/2015  6:05 PM 11/05/2015  7:27 PM Full Code [id number]  Clayburn Pert, MD Inpatient   07/20/2015 12:09 PM 07/24/2015  6:47 PM Full Code [id number]  Gillis Ends, MD Inpatient    Advance Directive Documentation     Most Recent Value  Type of Advance Directive  Out of facility DNR (pink MOST or yellow form)  Pre-existing out of facility DNR order (yellow form or pink MOST form)  -  "MOST" Form in Place?  -      TOTAL TIME TAKING CARE OF THIS PATIENT: 40 minutes.    Derrian Rodak M.D on 07/10/2017 at 7:44 AM  Between 7am to 6pm - Pager - [phone] After 6pm go to [email or web address] - password [email or web address] [facility]  Office  [phone]  CC: Primary care physician; Kathryn Billet, MD

## 2017-07-10 NOTE — Clinical Social Work Note (Signed)
Pt is ready for discharge today and will go to [facility]. Pt and family are aware and agreeable to discharge plan. Facility is ready to admit pt as they have received discharge information. RN will call report. [facility] will provide transportation. CSW is signing off as no further needs identified.   Darden Dates, MSW, LCSW  Clinical Social Worker r

## 2017-07-10 NOTE — Progress Notes (Addendum)
Pt for discharge to [facility]. Alert. No resp distress. Port  Flushed and deaccessed per  [employer]. tol well. Ready for transport in transport pack.  Report called to [facility] at [facility]. [facility] called for transport

## 2017-07-10 NOTE — Clinical Social Work Placement (Signed)
   CLINICAL SOCIAL WORK PLACEMENT  NOTE  Date:  07/10/2017  Patient Details  Name: Kathryn Chandler MRN: [id number] Date of Birth: Jan 26, 1929  Clinical Social Work is seeking post-discharge placement for this patient at the [place] level of care (*CSW will initial, date and re-position this form in  chart as items are completed):  Yes   Patient/family provided with [facility] Work Department's list of facilities offering this level of care within the geographic area requested by the patient (or if unable, by the patient's family).  Yes   Patient/family informed of their freedom to choose among providers that offer the needed level of care, that participate in Medicare, Medicaid or managed care program needed by the patient, have an available bed and are willing to accept the patient.  Yes   Patient/family informed of [facility]'s ownership interest in [facility] and [facility], as well as of the fact that they are under no obligation to receive care at these facilities.  PASRR submitted to EDS on 07/09/17     PASRR number received on 07/09/17     Existing PASRR number confirmed on       FL2 transmitted to all facilities in geographic area requested by pt/family on 07/09/17     FL2 transmitted to all facilities within larger geographic area on       Patient informed that his/her managed care company has contracts with or will negotiate with certain facilities, including the following:        Yes   Patient/family informed of bed offers received.  Patient chooses bed at [facility]     Physician recommends and patient chooses bed at      Patient to be transferred to [facility] on 07/10/17.  Patient to be transferred to facility by [facility]     Patient family notified on 07/10/17 of transfer.  Name of family member notified:  [name]     PHYSICIAN       Additional Comment:     _______________________________________________ Darden Dates, LCSW 07/10/2017, 10:54 AM

## 2017-07-11 ENCOUNTER — Inpatient Hospital Stay: Payer: Medicare Other | Admitting: Oncology

## 2017-07-12 LAB — BODY FLUID CULTURE: Culture: NO GROWTH

## 2017-07-14 NOTE — Progress Notes (Signed)
[place]  Telephone:(336) [phone] Fax:(336) [phone]  ID: [name] OB: 1929-09-03  MR#: [id number]  JAS#:[id number]  Patient Care Team: Albina Billet, MD as PCP - General (Internal Medicine) Gillis Ends, MD as Referring Physician (Obstetrics and Gynecology) Ward, Honor Loh, MD as Referring Physician (Obstetrics and Gynecology) Lloyd Huger, MD as Consulting Physician (Oncology)  CHIEF COMPLAINT: Stage IIIc left ovarian cancer status post suboptimal debulking and chemotherapy.  INTERVAL HISTORY: Patient returns to clinic today for further evaluation, hospital follow-up, and consideration of reinitiation of treatment. She is significantly improved and nearly back to her baseline. She currently is in a rehabilitation facility. She has no further shortness of breath. She has a good appetite and denies any further nausea. She does not complain of weakness or fatigue today. She has no neurologic complaints. She denies any fevers. She denies any chest pain, cough, or hemoptysis. She has no nausea, vomiting, constipation, or diarrhea. She denies any abdominal bloating.  She has no urinary complaints. Patient offers no further specific complaints today.  REVIEW OF SYSTEMS:   Review of Systems  Constitutional: Positive for malaise/fatigue. Negative for fever and weight loss.  Eyes: Negative for blurred vision.  Respiratory: Negative.  Negative for cough and shortness of breath.   Cardiovascular: Negative.  Negative for chest pain and leg swelling.  Gastrointestinal: Negative for abdominal pain, blood in stool, constipation, diarrhea, melena, nausea and vomiting.  Genitourinary: Negative.   Musculoskeletal: Negative.   Skin: Negative.  Negative for rash.  Neurological: Positive for weakness. Negative for sensory change.  Psychiatric/Behavioral: Negative.  The patient is not nervous/anxious.     As per HPI. Otherwise, a complete review of systems is  negative.  PAST MEDICAL HISTORY: Past Medical History:  Diagnosis Date  . Anxiety   . Cancer ([place])   . GERD (gastroesophageal reflux disease)   . Glaucoma   . Hypercholesteremia   . Hypertension   . Ovarian cancer ([place]) 06/2015   Bilateral Oophorectomies.   Marland Kitchen PONV (postoperative nausea and vomiting)     PAST SURGICAL HISTORY: Past Surgical History:  Procedure Laterality Date  . ABDOMINAL HYSTERECTOMY    . BREAST BIOPSY Right 1970's  . EXPLORATORY LAPAROTOMY W/ BOWEL RESECTION  01/11/2016   XL optimal debulking rectosigmoid resection and reanastomosis  . HAND SURGERY     Right  . LAPAROTOMY Left 07/20/2015   Procedure: LAPAROTOMY with Left Salpingooophrectomy;  Surgeon: Honor Loh Ward, MD;  Location: [facility];  Service: Gynecology;  Laterality: Left;  . LAPAROTOMY N/A 07/20/2015   Procedure: LAPAROTOMY;  Surgeon: Gillis Ends, MD;  Location: [facility];  Service: Gynecology;  Laterality: N/A;  . PERIPHERAL VASCULAR CATHETERIZATION N/A 08/10/2015   Procedure: Glori Luis Cath Insertion;  Surgeon: Algernon Huxley, MD;  Location: [place] [facility];  Service: Cardiovascular;  Laterality: N/A;  . REPLACEMENT TOTAL KNEE     Right  . SALPINGOOPHORECTOMY Right 07/20/2015   Procedure: SALPINGO OOPHORECTOMY;  Surgeon: Gillis Ends, MD;  Location: [facility];  Service: Gynecology;  Laterality: Right;    FAMILY HISTORY: Family History  Problem Relation Age of Onset  . Stomach cancer Mother   . Osteoporosis Mother   . Kidney disease Father   . Breast cancer Neg Hx        ADVANCED DIRECTIVES:    HEALTH MAINTENANCE: Social History  Substance Use Topics  . Smoking status: Never Smoker  . Smokeless tobacco: Never Used  . Alcohol use No  Colonoscopy:  PAP:  Bone density:  Lipid panel:  Allergies  Allergen Reactions  . Clindamycin/Lincomycin Diarrhea and Other (See Comments)    Reaction:  Antibiotic colitis   . Codeine Anxiety    Current Outpatient  Prescriptions  Medication Sig Dispense Refill  . acetaminophen (TYLENOL) 500 MG tablet Take 500-1,000 mg by mouth every 6 (six) hours as needed for mild pain, fever or headache.     . bimatoprost (LUMIGAN) 0.01 % SOLN Place 1 drop into both eyes at bedtime.    . fluticasone (FLONASE) 50 MCG/ACT nasal spray Place 2 sprays into both nostrils daily as needed for rhinitis.     Marland Kitchen lidocaine-prilocaine (EMLA) cream Apply 1 application topically as needed (prior to accessing port).    . LORazepam (ATIVAN) 0.5 MG tablet Take 0.5 mg by mouth 2 (two) times daily.     . meloxicam (MOBIC) 15 MG tablet Take 15 mg by mouth daily.     Marland Kitchen omeprazole (PRILOSEC) 20 MG capsule Take 20 mg by mouth daily.     . simvastatin (ZOCOR) 20 MG tablet Take 20 mg by mouth at bedtime.     Marland Kitchen dexamethasone (DECADRON) 4 MG tablet Take 1 tablet (4 mg total) by mouth daily. 30 tablet 1  . ondansetron (ZOFRAN) 8 MG tablet Take 1 tablet (8 mg total) by mouth every 8 (eight) hours as needed for nausea or vomiting. (Patient not taking: Reported on 07/17/2017) 60 tablet 2   No current facility-administered medications for this visit.    Facility-Administered Medications Ordered in Other Visits  Medication Dose Route Frequency Provider Last Rate Last Dose  . CARBOplatin (PARAPLATIN) 290 mg in sodium chloride 0.9 % 250 mL chemo infusion  290 mg Intravenous Once Lloyd Huger, MD      . dexamethasone (DECADRON) injection 10 mg  10 mg Intravenous Once Lloyd Huger, MD      . diphenhydrAMINE (BENADRYL) injection 12.5 mg  12.5 mg Intravenous Once Choksi, Delorise Shiner, MD      . heparin lock flush 100 unit/mL  500 Units Intravenous Once Lloyd Huger, MD      . heparin lock flush 100 unit/mL  500 Units Intracatheter Once PRN Lloyd Huger, MD      . methylPREDNISolone sodium succinate (SOLU-MEDROL) 125 mg/2 mL injection 100 mg  100 mg Intravenous Once Choksi, Delorise Shiner, MD      . PACLitaxel-protein bound (ABRAXANE) chemo infusion  125 mg  80 mg/m2 (Treatment Plan Recorded) Intravenous Once Lloyd Huger, MD      . palonosetron (ALOXI) injection 0.25 mg  0.25 mg Intravenous Once Lloyd Huger, MD      . sodium chloride flush (NS) 0.9 % injection 10 mL  10 mL Intravenous PRN Charlaine Dalton R, MD   10 mL at 05/20/17 1227    OBJECTIVE: Vitals:   07/17/17 0931  BP: 123/83  Pulse: 91  Resp: 18  Temp: (!) 97.5 F (36.4 C)     Body mass index is 23.33 kg/m.    ECOG FS:1 - Symptomatic but completely ambulatory  General: Thin, no acute distress. Eyes: Pink conjunctiva, anicteric sclera. Lungs: Clear to auscultation bilaterally. Heart: Regular rate and rhythm. No rubs, murmurs, or gallops. Abdomen: Midline reducible hernia, nontender. Musculoskeletal: No edema, cyanosis, or clubbing. Neuro: Alert, answering all questions appropriately. Cranial nerves grossly intact. Skin: No rashes or petechiae noted. Psych: Normal affect.   LAB RESULTS:  Lab Results  Component Value Date   NA 132 (L)  07/17/2017   K 3.1 (L) 07/17/2017   CL 96 (L) 07/17/2017   CO2 28 07/17/2017   GLUCOSE 122 (H) 07/17/2017   BUN 20 07/17/2017   CREATININE 0.85 07/17/2017   CALCIUM 8.4 (L) 07/17/2017   PROT 6.6 07/17/2017   ALBUMIN 3.0 (L) 07/17/2017   AST 51 (H) 07/17/2017   ALT 41 07/17/2017   ALKPHOS 113 07/17/2017   BILITOT 0.9 07/17/2017   GFRNONAA >60 07/17/2017   GFRAA >60 07/17/2017    Lab Results  Component Value Date   WBC 15.8 (H) 07/17/2017   NEUTROABS 12.1 (H) 07/17/2017   HGB 11.9 (L) 07/17/2017   HCT 36.0 07/17/2017   MCV 83.1 07/17/2017   PLT 337 07/17/2017    Lab Results  Component Value Date   CA125 174.9 (H) 05/20/2017    STUDIES: Dg Chest Port 1 View  Result Date: 07/08/2017 CLINICAL DATA:  Status post left thoracentesis EXAM: PORTABLE CHEST 1 VIEW COMPARISON:  Film from earlier in the same day FINDINGS: There is been near complete reduction of the left pleural effusion when compare  with the prior exam. No pneumothorax is noted. Mild interstitial changes in bibasilar atelectatic changes are seen. Right chest wall port is noted in satisfactory position. Cardiac shadow is stable. Aortic calcifications are seen. No bony abnormality is noted. IMPRESSION: Status post left thoracentesis without evidence of pneumothorax. Electronically Signed   By: Inez Catalina M.D.   On: 07/08/2017 15:53   Dg Chest Port 1 View  Result Date: 07/08/2017 CLINICAL DATA:  Short of breath EXAM: PORTABLE CHEST 1 VIEW COMPARISON:  04/17/2016 FINDINGS: Stable right jugular Port-A-Cath. Heart is moderately enlarged. Left pleural effusion has developed. There is shift of the mediastinum to the right. Right lung is clear. No pneumothorax. IMPRESSION: New moderate left pleural effusion with shift of the mediastinum to the right. Electronically Signed   By: Marybelle Killings M.D.   On: 07/08/2017 14:28   US Thoracentesis Asp Pleural Space W/img Guide  Result Date: 07/08/2017 INDICATION: Left pleural effusion EXAM: ULTRASOUND GUIDED LEFT THORACENTESIS MEDICATIONS: None. COMPLICATIONS: None immediate. PROCEDURE: An ultrasound guided thoracentesis was thoroughly discussed with the patient and questions answered. The benefits, risks, alternatives and complications were also discussed. The patient understands and wishes to proceed with the procedure. Written consent was obtained. Ultrasound was performed to localize and mark an adequate pocket of fluid in the left chest. The area was then prepped and draped in the normal sterile fashion. 1% Lidocaine was used for local anesthesia. Under ultrasound guidance a 6 Fr Safe-T-Centesis catheter was introduced. Thoracentesis was performed. The catheter was removed and a dressing applied. FINDINGS: A total of approximately 1 L of clear yellow fluid was removed. Samples were sent to the laboratory as requested by the clinical team. IMPRESSION: Successful ultrasound guided left thoracentesis  yielding 1 L of pleural fluid. Electronically Signed   By: Inez Catalina M.D.   On: 07/08/2017 15:53   Oncology History: Patient previously underwent suboptimal debulking in September 2016. She then completed 3 cycles of carboplatinum, Abraxane, and Avastin between August 15, 2015 and October 31, 2015. She then received 5 total treatments of carboplatinum and Abraxane only between March 13, 2016 and Apr 13, 2016. Treatment was then discontinued per patient preference.   ASSESSMENT: Stage IIIc left ovarian cancer status post suboptimal debulking and chemotherapy.  PLAN:    1. Stage IIIc left ovarian cancer: See oncology history from previous provider as above. CT scan results from June 06, 2017  reviewed independently and reported as above with concern for recurrent disease. Patient also had a paracentesis on the same day removing 2.9 L of peritoneal fluid. Her CA-125 also continues to trend up. Despite declining additional treatment previously, patient has changed her mind and wish to pursue palliative chemotherapy. Because it has been greater than 1 year since her last treatment, will proceed with the same treatment she received previously which is carboplatinum and Abraxane on day 1 with Abraxane only on days 1, 8, and 15 with day 22 off. Proceed with cycle 1, day 1 of treatment today. Return to clinic in 1 week for consideration of cycle 1, day 8 which will be Abraxane only.  2. Hypertension: Patient's blood pressure is within normal limits today. Monitor.  3. Nausea and vomiting: Resolved.  4. Weakness and fatigue: Improved. 5. Poor appetite: Improved. Consider dietary consult in the future. 6. Pleural effusion: Patient does not complain of shortness of breath today. Monitor.   Patient expressed understanding and was in agreement with this plan. She also understands that She can call clinic at any time with any questions, concerns, or complaints.     Lloyd Huger, MD   07/17/2017  11:12 AM

## 2017-07-17 ENCOUNTER — Inpatient Hospital Stay: Payer: Medicare Other

## 2017-07-17 ENCOUNTER — Inpatient Hospital Stay (HOSPITAL_BASED_OUTPATIENT_CLINIC_OR_DEPARTMENT_OTHER): Payer: Medicare Other | Admitting: Oncology

## 2017-07-17 VITALS — BP 123/83 | HR 91 | Temp 97.5°F | Resp 18 | Wt 115.5 lb

## 2017-07-17 DIAGNOSIS — R1904 Left lower quadrant abdominal swelling, mass and lump: Secondary | ICD-10-CM

## 2017-07-17 DIAGNOSIS — I2510 Atherosclerotic heart disease of native coronary artery without angina pectoris: Secondary | ICD-10-CM | POA: Diagnosis not present

## 2017-07-17 DIAGNOSIS — E7800 Pure hypercholesterolemia, unspecified: Secondary | ICD-10-CM

## 2017-07-17 DIAGNOSIS — Z5111 Encounter for antineoplastic chemotherapy: Secondary | ICD-10-CM | POA: Diagnosis not present

## 2017-07-17 DIAGNOSIS — I10 Essential (primary) hypertension: Secondary | ICD-10-CM

## 2017-07-17 DIAGNOSIS — Z79899 Other long term (current) drug therapy: Secondary | ICD-10-CM

## 2017-07-17 DIAGNOSIS — R531 Weakness: Secondary | ICD-10-CM

## 2017-07-17 DIAGNOSIS — I700 Atherosclerosis of aorta: Secondary | ICD-10-CM | POA: Diagnosis not present

## 2017-07-17 DIAGNOSIS — C562 Malignant neoplasm of left ovary: Secondary | ICD-10-CM | POA: Diagnosis not present

## 2017-07-17 DIAGNOSIS — R630 Anorexia: Secondary | ICD-10-CM

## 2017-07-17 DIAGNOSIS — I517 Cardiomegaly: Secondary | ICD-10-CM | POA: Diagnosis not present

## 2017-07-17 DIAGNOSIS — C569 Malignant neoplasm of unspecified ovary: Secondary | ICD-10-CM

## 2017-07-17 DIAGNOSIS — F419 Anxiety disorder, unspecified: Secondary | ICD-10-CM

## 2017-07-17 DIAGNOSIS — K219 Gastro-esophageal reflux disease without esophagitis: Secondary | ICD-10-CM | POA: Diagnosis not present

## 2017-07-17 DIAGNOSIS — Z8543 Personal history of malignant neoplasm of ovary: Secondary | ICD-10-CM

## 2017-07-17 DIAGNOSIS — J90 Pleural effusion, not elsewhere classified: Secondary | ICD-10-CM

## 2017-07-17 DIAGNOSIS — K8020 Calculus of gallbladder without cholecystitis without obstruction: Secondary | ICD-10-CM | POA: Diagnosis not present

## 2017-07-17 DIAGNOSIS — Z85820 Personal history of malignant melanoma of skin: Secondary | ICD-10-CM

## 2017-07-17 DIAGNOSIS — R188 Other ascites: Secondary | ICD-10-CM

## 2017-07-17 DIAGNOSIS — R110 Nausea: Secondary | ICD-10-CM

## 2017-07-17 DIAGNOSIS — C786 Secondary malignant neoplasm of retroperitoneum and peritoneum: Secondary | ICD-10-CM | POA: Diagnosis not present

## 2017-07-17 DIAGNOSIS — Z800 Family history of malignant neoplasm of digestive organs: Secondary | ICD-10-CM

## 2017-07-17 DIAGNOSIS — R5383 Other fatigue: Secondary | ICD-10-CM

## 2017-07-17 LAB — CBC WITH DIFFERENTIAL/PLATELET
BASOS ABS: 0 10*3/uL (ref 0–0.1)
BASOS PCT: 0 %
EOS ABS: 0 10*3/uL (ref 0–0.7)
EOS PCT: 0 %
HCT: 36 % (ref 35.0–47.0)
Hemoglobin: 11.9 g/dL — ABNORMAL LOW (ref 12.0–16.0)
Lymphocytes Relative: 15 %
Lymphs Abs: 2.3 10*3/uL (ref 1.0–3.6)
MCH: 27.4 pg (ref 26.0–34.0)
MCHC: 32.9 g/dL (ref 32.0–36.0)
MCV: 83.1 fL (ref 80.0–100.0)
MONO ABS: 1.3 10*3/uL — AB (ref 0.2–0.9)
MONOS PCT: 8 %
NEUTROS ABS: 12.1 10*3/uL — AB (ref 1.4–6.5)
Neutrophils Relative %: 77 %
PLATELETS: 337 10*3/uL (ref 150–440)
RBC: 4.33 MIL/uL (ref 3.80–5.20)
RDW: 15.2 % — AB (ref 11.5–14.5)
WBC: 15.8 10*3/uL — ABNORMAL HIGH (ref 3.6–11.0)

## 2017-07-17 LAB — COMPREHENSIVE METABOLIC PANEL
ALBUMIN: 3 g/dL — AB (ref 3.5–5.0)
ALT: 41 U/L (ref 14–54)
ANION GAP: 8 (ref 5–15)
AST: 51 U/L — AB (ref 15–41)
Alkaline Phosphatase: 113 U/L (ref 38–126)
BILIRUBIN TOTAL: 0.9 mg/dL (ref 0.3–1.2)
BUN: 20 mg/dL (ref 6–20)
CHLORIDE: 96 mmol/L — AB (ref 101–111)
CO2: 28 mmol/L (ref 22–32)
Calcium: 8.4 mg/dL — ABNORMAL LOW (ref 8.9–10.3)
Creatinine, Ser: 0.85 mg/dL (ref 0.44–1.00)
GFR calc Af Amer: >60 mL/min (ref >60)
GFR calc non Af Amer: >60 mL/min (ref >60)
GLUCOSE: 122 mg/dL — AB (ref 65–99)
POTASSIUM: 3.1 mmol/L — AB (ref 3.5–5.1)
SODIUM: 132 mmol/L — AB (ref 135–145)
TOTAL PROTEIN: 6.6 g/dL (ref 6.5–8.1)

## 2017-07-17 MED ORDER — SODIUM CHLORIDE 0.9 % IV SOLN: 10.0000 mg | Freq: Once | INTRAVENOUS | Status: DC

## 2017-07-17 MED ORDER — SODIUM CHLORIDE 0.9 % IV SOLN
Freq: Once | INTRAVENOUS | Status: AC
  Administered 2017-07-17: 11:00:00 via INTRAVENOUS
  Filled 2017-07-17: qty 1000

## 2017-07-17 MED ORDER — HEPARIN SOD (PORK) LOCK FLUSH 100 UNIT/ML IV SOLN
500.0000 [IU] | Freq: Once | INTRAVENOUS | Status: AC
  Administered 2017-07-17: 500 [IU] via INTRAVENOUS

## 2017-07-17 MED ORDER — SODIUM CHLORIDE 0.9% FLUSH
10.0000 mL | Freq: Once | INTRAVENOUS | Status: AC
  Administered 2017-07-17: 10 mL via INTRAVENOUS
  Filled 2017-07-17: qty 10

## 2017-07-17 MED ORDER — PALONOSETRON HCL INJECTION 0.25 MG/5ML
0.2500 mg | Freq: Once | INTRAVENOUS | Status: AC
  Administered 2017-07-17: 0.25 mg via INTRAVENOUS
  Filled 2017-07-17: qty 5

## 2017-07-17 MED ORDER — HEPARIN SOD (PORK) LOCK FLUSH 100 UNIT/ML IV SOLN
500.0000 [IU] | Freq: Once | INTRAVENOUS | Status: DC | PRN
  Filled 2017-07-17: qty 5

## 2017-07-17 MED ORDER — SODIUM CHLORIDE 0.9 % IV SOLN
293.5000 mg | Freq: Once | INTRAVENOUS | Status: AC
  Administered 2017-07-17: 290 mg via INTRAVENOUS
  Filled 2017-07-17: qty 29

## 2017-07-17 MED ORDER — DEXAMETHASONE SODIUM PHOSPHATE 10 MG/ML IJ SOLN
10.0000 mg | Freq: Once | INTRAMUSCULAR | Status: AC
  Administered 2017-07-17: 10 mg via INTRAVENOUS
  Filled 2017-07-17: qty 1

## 2017-07-17 MED ORDER — PACLITAXEL PROTEIN-BOUND CHEMO INJECTION 100 MG
80.0000 mg/m2 | Freq: Once | INTRAVENOUS | Status: AC
  Administered 2017-07-17: 125 mg via INTRAVENOUS
  Filled 2017-07-17: qty 25

## 2017-07-18 LAB — CA 125: Cancer Antigen (CA) 125: 455.1 U/mL — ABNORMAL HIGH (ref 0.0–38.1)

## 2017-07-19 NOTE — Progress Notes (Signed)
[place]  Telephone:(336) [phone] Fax:(336) [phone]  ID: Kathryn Chandler OB: 19-May-1929  MR#: [id number]  OYD#:[id number]  Patient Care Team: Albina Billet, MD as PCP - General (Internal Medicine) Gillis Ends, MD as Referring Physician (Obstetrics and Gynecology) Ward, Honor Loh, MD as Referring Physician (Obstetrics and Gynecology) Lloyd Huger, MD as Consulting Physician (Oncology)  CHIEF COMPLAINT: Stage IIIc left ovarian cancer status post suboptimal debulking and chemotherapy.  INTERVAL HISTORY: Patient returns to clinic today for further evaluation and consideration of cycle 1, day 8 of carboplatinum and Abraxane. Abraxane only today. She tolerated her first treatment well without significant side effects. She continues to have significant weakness and fatigue and remains in a rehabilitation facility. She has no further shortness of breath. She has a good appetite and denies any further nausea.  She has no neurologic complaints. She denies any fevers. She denies any chest pain, cough, or hemoptysis. She has no nausea, vomiting, constipation, or diarrhea. She denies any abdominal bloating.  She has no urinary complaints. Patient offers no further specific complaints today.  REVIEW OF SYSTEMS:   Review of Systems  Constitutional: Positive for malaise/fatigue. Negative for fever and weight loss.  Eyes: Negative for blurred vision.  Respiratory: Negative.  Negative for cough and shortness of breath.   Cardiovascular: Negative.  Negative for chest pain and leg swelling.  Gastrointestinal: Negative for abdominal pain, blood in stool, constipation, diarrhea, melena, nausea and vomiting.  Genitourinary: Negative.   Musculoskeletal: Negative.   Skin: Negative.  Negative for rash.  Neurological: Positive for weakness. Negative for sensory change.  Psychiatric/Behavioral: Negative.  The patient is not nervous/anxious.     As per HPI. Otherwise, a  complete review of systems is negative.  PAST MEDICAL HISTORY: Past Medical History:  Diagnosis Date  . Anxiety   . Cancer ([place])   . GERD (gastroesophageal reflux disease)   . Glaucoma   . Hypercholesteremia   . Hypertension   . Ovarian cancer ([place]) 06/2015   Bilateral Oophorectomies.   Marland Kitchen PONV (postoperative nausea and vomiting)     PAST SURGICAL HISTORY: Past Surgical History:  Procedure Laterality Date  . ABDOMINAL HYSTERECTOMY    . BREAST BIOPSY Right 1970's  . EXPLORATORY LAPAROTOMY W/ BOWEL RESECTION  01/11/2016   XL optimal debulking rectosigmoid resection and reanastomosis  . HAND SURGERY     Right  . LAPAROTOMY Left 07/20/2015   Procedure: LAPAROTOMY with Left Salpingooophrectomy;  Surgeon: Honor Loh Ward, MD;  Location: [facility];  Service: Gynecology;  Laterality: Left;  . LAPAROTOMY N/A 07/20/2015   Procedure: LAPAROTOMY;  Surgeon: Gillis Ends, MD;  Location: [facility];  Service: Gynecology;  Laterality: N/A;  . PERIPHERAL VASCULAR CATHETERIZATION N/A 08/10/2015   Procedure: Glori Luis Cath Insertion;  Surgeon: Algernon Huxley, MD;  Location: [place] [facility];  Service: Cardiovascular;  Laterality: N/A;  . REPLACEMENT TOTAL KNEE     Right  . SALPINGOOPHORECTOMY Right 07/20/2015   Procedure: SALPINGO OOPHORECTOMY;  Surgeon: Gillis Ends, MD;  Location: [facility];  Service: Gynecology;  Laterality: Right;    FAMILY HISTORY: Family History  Problem Relation Age of Onset  . Stomach cancer Mother   . Osteoporosis Mother   . Kidney disease Father   . Breast cancer Neg Hx        ADVANCED DIRECTIVES:    HEALTH MAINTENANCE: Social History  Substance Use Topics  . Smoking status: Never Smoker  . Smokeless tobacco: Never Used  . Alcohol  use No     Colonoscopy:  PAP:  Bone density:  Lipid panel:  Allergies  Allergen Reactions  . Clindamycin/Lincomycin Diarrhea and Other (See Comments)    Reaction:  Antibiotic colitis   . Codeine Anxiety     Current Outpatient Prescriptions  Medication Sig Dispense Refill  . acetaminophen (TYLENOL) 500 MG tablet Take 500-1,000 mg by mouth every 6 (six) hours as needed for mild pain, fever or headache.     . bimatoprost (LUMIGAN) 0.01 % SOLN Place 1 drop into both eyes at bedtime.    Marland Kitchen dexamethasone (DECADRON) 4 MG tablet Take 1 tablet (4 mg total) by mouth daily. 30 tablet 1  . fluticasone (FLONASE) 50 MCG/ACT nasal spray Place 2 sprays into both nostrils daily as needed for rhinitis.     Marland Kitchen lidocaine-prilocaine (EMLA) cream Apply 1 application topically as needed (prior to accessing port).    . LORazepam (ATIVAN) 0.5 MG tablet Take 0.5 mg by mouth 2 (two) times daily.     . meloxicam (MOBIC) 15 MG tablet Take 15 mg by mouth daily.     Marland Kitchen omeprazole (PRILOSEC) 20 MG capsule Take 20 mg by mouth daily.     . ondansetron (ZOFRAN) 8 MG tablet Take 1 tablet (8 mg total) by mouth every 8 (eight) hours as needed for nausea or vomiting. 60 tablet 2  . simvastatin (ZOCOR) 20 MG tablet Take 20 mg by mouth at bedtime.     . timolol (TIMOPTIC) 0.5 % ophthalmic solution      No current facility-administered medications for this visit.    Facility-Administered Medications Ordered in Other Visits  Medication Dose Route Frequency Provider Last Rate Last Dose  . diphenhydrAMINE (BENADRYL) injection 12.5 mg  12.5 mg Intravenous Once Choksi, Delorise Shiner, MD      . heparin lock flush 100 unit/mL  500 Units Intracatheter Once PRN Lloyd Huger, MD      . methylPREDNISolone sodium succinate (SOLU-MEDROL) 125 mg/2 mL injection 100 mg  100 mg Intravenous Once Choksi, Delorise Shiner, MD      . PACLitaxel-protein bound (ABRAXANE) chemo infusion 125 mg  80 mg/m2 (Treatment Plan Recorded) Intravenous Once Lloyd Huger, MD 50 mL/hr at 07/24/17 1058 125 mg at 07/24/17 1058  . sodium chloride flush (NS) 0.9 % injection 10 mL  10 mL Intravenous PRN Charlaine Dalton R, MD   10 mL at 05/20/17 1227    OBJECTIVE: Vitals:    07/24/17 0934  BP: 117/71  Pulse: 94  Resp: 18  Temp: (!) 97.3 F (36.3 C)     Body mass index is 21.93 kg/m.    ECOG FS:1 - Symptomatic but completely ambulatory  General: Thin, no acute distress. Eyes: Pink conjunctiva, anicteric sclera. Lungs: Clear to auscultation bilaterally. Heart: Regular rate and rhythm. No rubs, murmurs, or gallops. Abdomen: Midline reducible hernia, nontender. Musculoskeletal: No edema, cyanosis, or clubbing. Neuro: Alert, answering all questions appropriately. Cranial nerves grossly intact. Skin: No rashes or petechiae noted. Psych: Normal affect.   LAB RESULTS:  Lab Results  Component Value Date   NA 131 (L) 07/24/2017   K 3.4 (L) 07/24/2017   CL 95 (L) 07/24/2017   CO2 26 07/24/2017   GLUCOSE 114 (H) 07/24/2017   BUN 17 07/24/2017   CREATININE 0.74 07/24/2017   CALCIUM 8.5 (L) 07/24/2017   PROT 6.7 07/24/2017   ALBUMIN 3.0 (L) 07/24/2017   AST 34 07/24/2017   ALT 26 07/24/2017   ALKPHOS 96 07/24/2017   BILITOT 0.9 07/24/2017  GFRNONAA >60 07/24/2017   GFRAA >60 07/24/2017    Lab Results  Component Value Date   WBC 8.8 07/24/2017   NEUTROABS 6.1 07/24/2017   HGB 11.4 (L) 07/24/2017   HCT 33.9 (L) 07/24/2017   MCV 82.7 07/24/2017   PLT 283 07/24/2017    Lab Results  Component Value Date   CA125 174.9 (H) 05/20/2017    STUDIES: Dg Chest Port 1 View  Result Date: 07/08/2017 CLINICAL DATA:  Status post left thoracentesis EXAM: PORTABLE CHEST 1 VIEW COMPARISON:  Film from earlier in the same day FINDINGS: There is been near complete reduction of the left pleural effusion when compare with the prior exam. No pneumothorax is noted. Mild interstitial changes in bibasilar atelectatic changes are seen. Right chest wall port is noted in satisfactory position. Cardiac shadow is stable. Aortic calcifications are seen. No bony abnormality is noted. IMPRESSION: Status post left thoracentesis without evidence of pneumothorax. Electronically  Signed   By: Inez Catalina M.D.   On: 07/08/2017 15:53   Dg Chest Port 1 View  Result Date: 07/08/2017 CLINICAL DATA:  Short of breath EXAM: PORTABLE CHEST 1 VIEW COMPARISON:  04/17/2016 FINDINGS: Stable right jugular Port-A-Cath. Heart is moderately enlarged. Left pleural effusion has developed. There is shift of the mediastinum to the right. Right lung is clear. No pneumothorax. IMPRESSION: New moderate left pleural effusion with shift of the mediastinum to the right. Electronically Signed   By: Marybelle Killings M.D.   On: 07/08/2017 14:28   US Thoracentesis Asp Pleural Space W/img Guide  Result Date: 07/08/2017 INDICATION: Left pleural effusion EXAM: ULTRASOUND GUIDED LEFT THORACENTESIS MEDICATIONS: None. COMPLICATIONS: None immediate. PROCEDURE: An ultrasound guided thoracentesis was thoroughly discussed with the patient and questions answered. The benefits, risks, alternatives and complications were also discussed. The patient understands and wishes to proceed with the procedure. Written consent was obtained. Ultrasound was performed to localize and mark an adequate pocket of fluid in the left chest. The area was then prepped and draped in the normal sterile fashion. 1% Lidocaine was used for local anesthesia. Under ultrasound guidance a 6 Fr Safe-T-Centesis catheter was introduced. Thoracentesis was performed. The catheter was removed and a dressing applied. FINDINGS: A total of approximately 1 L of clear yellow fluid was removed. Samples were sent to the laboratory as requested by the clinical team. IMPRESSION: Successful ultrasound guided left thoracentesis yielding 1 L of pleural fluid. Electronically Signed   By: Inez Catalina M.D.   On: 07/08/2017 15:53   Oncology History: Patient previously underwent suboptimal debulking in September 2016. She then completed 3 cycles of carboplatinum, Abraxane, and Avastin between August 15, 2015 and October 31, 2015. She then received 5 total treatments of  carboplatinum and Abraxane only between March 13, 2016 and Apr 13, 2016. Treatment was then discontinued per patient preference.   ASSESSMENT: Stage IIIc left ovarian cancer status post suboptimal debulking and chemotherapy.  PLAN:    1. Stage IIIc left ovarian cancer: See oncology history from previous provider as above. CT scan results from June 06, 2017 reviewed independently with concern for recurrent disease. Patient also had a paracentesis on the same day removing 2.9 L of peritoneal fluid. Her CA-125 also continues to trend up. Despite declining additional treatment previously, patient has changed her mind and wish to pursue palliative chemotherapy. Because it has been greater than 1 year since her last treatment, will proceed with the same treatment she received previously which is carboplatinum and Abraxane on day 1 with  Abraxane only on days 1, 8, and 15 with day 22 off. Proceed with cycle 1, day 8 of treatment today. Abraxane only. Return to clinic in 1 week for consideration of cycle 1, day 15 which will be Abraxane only.  2. Hypertension: Patient's blood pressure is within normal limits today. Monitor.  3. Nausea and vomiting: Resolved.  4. Weakness and fatigue: Improved. 5. Poor appetite: Improved. Consider dietary consult in the future. 6. Pleural effusion: Patient does not complain of shortness of breath today. Monitor.   Patient expressed understanding and was in agreement with this plan. She also understands that She can call clinic at any time with any questions, concerns, or complaints.     Lloyd Huger, MD   07/24/2017 10:59 AM

## 2017-07-24 ENCOUNTER — Inpatient Hospital Stay: Payer: Medicare Other

## 2017-07-24 ENCOUNTER — Inpatient Hospital Stay: Payer: Medicare Other | Attending: Oncology

## 2017-07-24 ENCOUNTER — Inpatient Hospital Stay (HOSPITAL_BASED_OUTPATIENT_CLINIC_OR_DEPARTMENT_OTHER): Payer: Medicare Other | Admitting: Oncology

## 2017-07-24 VITALS — BP 117/71 | HR 94 | Temp 97.3°F | Resp 18 | Wt 108.6 lb

## 2017-07-24 DIAGNOSIS — R531 Weakness: Secondary | ICD-10-CM | POA: Insufficient documentation

## 2017-07-24 DIAGNOSIS — Z5112 Encounter for antineoplastic immunotherapy: Secondary | ICD-10-CM | POA: Diagnosis not present

## 2017-07-24 DIAGNOSIS — R5383 Other fatigue: Secondary | ICD-10-CM | POA: Diagnosis not present

## 2017-07-24 DIAGNOSIS — R630 Anorexia: Secondary | ICD-10-CM | POA: Insufficient documentation

## 2017-07-24 DIAGNOSIS — Z90722 Acquired absence of ovaries, bilateral: Secondary | ICD-10-CM | POA: Diagnosis not present

## 2017-07-24 DIAGNOSIS — J90 Pleural effusion, not elsewhere classified: Secondary | ICD-10-CM | POA: Diagnosis not present

## 2017-07-24 DIAGNOSIS — C569 Malignant neoplasm of unspecified ovary: Secondary | ICD-10-CM

## 2017-07-24 DIAGNOSIS — C562 Malignant neoplasm of left ovary: Secondary | ICD-10-CM | POA: Insufficient documentation

## 2017-07-24 DIAGNOSIS — Z87891 Personal history of nicotine dependence: Secondary | ICD-10-CM | POA: Diagnosis not present

## 2017-07-24 DIAGNOSIS — F419 Anxiety disorder, unspecified: Secondary | ICD-10-CM | POA: Insufficient documentation

## 2017-07-24 DIAGNOSIS — E7800 Pure hypercholesterolemia, unspecified: Secondary | ICD-10-CM | POA: Insufficient documentation

## 2017-07-24 DIAGNOSIS — Z800 Family history of malignant neoplasm of digestive organs: Secondary | ICD-10-CM | POA: Diagnosis not present

## 2017-07-24 DIAGNOSIS — I10 Essential (primary) hypertension: Secondary | ICD-10-CM

## 2017-07-24 DIAGNOSIS — K219 Gastro-esophageal reflux disease without esophagitis: Secondary | ICD-10-CM | POA: Diagnosis not present

## 2017-07-24 DIAGNOSIS — Z90710 Acquired absence of both cervix and uterus: Secondary | ICD-10-CM | POA: Diagnosis not present

## 2017-07-24 LAB — COMPREHENSIVE METABOLIC PANEL
ALBUMIN: 3 g/dL — AB (ref 3.5–5.0)
ALK PHOS: 96 U/L (ref 38–126)
ALT: 26 U/L (ref 14–54)
ANION GAP: 10 (ref 5–15)
AST: 34 U/L (ref 15–41)
BILIRUBIN TOTAL: 0.9 mg/dL (ref 0.3–1.2)
BUN: 17 mg/dL (ref 6–20)
CALCIUM: 8.5 mg/dL — AB (ref 8.9–10.3)
CO2: 26 mmol/L (ref 22–32)
Chloride: 95 mmol/L — ABNORMAL LOW (ref 101–111)
Creatinine, Ser: 0.74 mg/dL (ref 0.44–1.00)
GFR calc Af Amer: >60 mL/min (ref >60)
GLUCOSE: 114 mg/dL — AB (ref 65–99)
POTASSIUM: 3.4 mmol/L — AB (ref 3.5–5.1)
Sodium: 131 mmol/L — ABNORMAL LOW (ref 135–145)
TOTAL PROTEIN: 6.7 g/dL (ref 6.5–8.1)

## 2017-07-24 LAB — CBC WITH DIFFERENTIAL/PLATELET
Basophils Absolute: 0 10*3/uL (ref 0–0.1)
Basophils Relative: 0 %
Eosinophils Absolute: 0.1 10*3/uL (ref 0–0.7)
Eosinophils Relative: 1 %
HEMATOCRIT: 33.9 % — AB (ref 35.0–47.0)
HEMOGLOBIN: 11.4 g/dL — AB (ref 12.0–16.0)
LYMPHS ABS: 2.1 10*3/uL (ref 1.0–3.6)
LYMPHS PCT: 24 %
MCH: 27.7 pg (ref 26.0–34.0)
MCHC: 33.5 g/dL (ref 32.0–36.0)
MCV: 82.7 fL (ref 80.0–100.0)
MONO ABS: 0.5 10*3/uL (ref 0.2–0.9)
MONOS PCT: 6 %
NEUTROS ABS: 6.1 10*3/uL (ref 1.4–6.5)
NEUTROS PCT: 69 %
Platelets: 283 10*3/uL (ref 150–440)
RBC: 4.1 MIL/uL (ref 3.80–5.20)
RDW: 15.4 % — AB (ref 11.5–14.5)
WBC: 8.8 10*3/uL (ref 3.6–11.0)

## 2017-07-24 MED ORDER — PACLITAXEL PROTEIN-BOUND CHEMO INJECTION 100 MG
80.0000 mg/m2 | Freq: Once | INTRAVENOUS | Status: AC
  Administered 2017-07-24: 125 mg via INTRAVENOUS
  Filled 2017-07-24: qty 25

## 2017-07-24 MED ORDER — HEPARIN SOD (PORK) LOCK FLUSH 100 UNIT/ML IV SOLN
500.0000 [IU] | Freq: Once | INTRAVENOUS | Status: AC | PRN
  Administered 2017-07-24: 500 [IU]
  Filled 2017-07-24: qty 5

## 2017-07-24 MED ORDER — SODIUM CHLORIDE 0.9 % IV SOLN
Freq: Once | INTRAVENOUS | Status: AC
  Administered 2017-07-24: 10:00:00 via INTRAVENOUS
  Filled 2017-07-24: qty 1000

## 2017-07-24 MED ORDER — PROCHLORPERAZINE MALEATE 10 MG PO TABS
10.0000 mg | ORAL_TABLET | Freq: Once | ORAL | Status: AC
  Administered 2017-07-24: 10 mg via ORAL
  Filled 2017-07-24: qty 1

## 2017-07-25 LAB — CA 125: CANCER ANTIGEN (CA) 125: 398.5 U/mL — AB (ref 0.0–38.1)

## 2017-07-31 NOTE — Progress Notes (Signed)
[place]  [phone] [phone]) [phone]  ID: Kathryn Chandler OB: 06-01-1929  MR#: [id number]  MLY#:[id number]  Patient Care Team: Albina Billet, MD as PCP - General (Internal Medicine) Gillis Ends, MD as Referring Physician (Obstetrics and Gynecology) Ward, Honor Loh, MD as Referring Physician (Obstetrics and Gynecology) Lloyd Huger, MD as Consulting Physician (Oncology)  CHIEF COMPLAINT: Stage IIIc left ovarian cancer status post suboptimal debulking and chemotherapy.  INTERVAL HISTORY: Patient returns to clinic today for further evaluation and consideration of cycle 1, day 15 of carboplatinum and Abraxane. Abraxane only today. She is tolerating her treatments well without significant side effects. She continues to have weakness and fatigue and remains in a rehabilitation facility. She has no further shortness of breath. She has a good appetite and denies any further nausea.  She has no neurologic complaints. She denies any fevers. She denies any chest pain, cough, or hemoptysis. She has no nausea, vomiting, constipation, or diarrhea. She denies any abdominal bloating.  She has no urinary complaints. Patient offers no further specific complaints today.  REVIEW OF SYSTEMS:   Review of Systems  Constitutional: Positive for malaise/fatigue. Negative for fever and weight loss.  Eyes: Negative for blurred vision.  Respiratory: Negative.  Negative for cough and shortness of breath.   Cardiovascular: Negative.  Negative for chest pain and leg swelling.  Gastrointestinal: Negative for abdominal pain, blood in stool, constipation, diarrhea, melena, nausea and vomiting.  Genitourinary: Negative.   Musculoskeletal: Negative.   Skin: Negative.  Negative for rash.  Neurological: Positive for weakness. Negative for sensory change.  Psychiatric/Behavioral: Negative.  The patient is not nervous/anxious.     As per HPI. Otherwise, a complete review  of systems is negative.  PAST MEDICAL HISTORY: Past Medical History:  Diagnosis Date  . Anxiety   . Cancer ([place])   . GERD (gastroesophageal reflux disease)   . Glaucoma   . Hypercholesteremia   . Hypertension   . Ovarian cancer ([place]) 06/2015   Bilateral Oophorectomies.   Marland Kitchen PONV (postoperative nausea and vomiting)     PAST SURGICAL HISTORY: Past Surgical History:  Procedure Laterality Date  . ABDOMINAL HYSTERECTOMY    . BREAST BIOPSY Right 1970's  . EXPLORATORY LAPAROTOMY W/ BOWEL RESECTION  01/11/2016   XL optimal debulking rectosigmoid resection and reanastomosis  . HAND SURGERY     Right  . LAPAROTOMY Left 07/20/2015   Procedure: LAPAROTOMY with Left Salpingooophrectomy;  Surgeon: Honor Loh Ward, MD;  Location: [facility];  Service: Gynecology;  Laterality: Left;  . LAPAROTOMY N/A 07/20/2015   Procedure: LAPAROTOMY;  Surgeon: Gillis Ends, MD;  Location: [facility];  Service: Gynecology;  Laterality: N/A;  . PERIPHERAL VASCULAR CATHETERIZATION N/A 08/10/2015   Procedure: Glori Luis Cath Insertion;  Surgeon: Algernon Huxley, MD;  Location: [facility];  Service: Cardiovascular;  Laterality: N/A;  . REPLACEMENT TOTAL KNEE     Right  . SALPINGOOPHORECTOMY Right 07/20/2015   Procedure: SALPINGO OOPHORECTOMY;  Surgeon: Gillis Ends, MD;  Location: [facility];  Service: Gynecology;  Laterality: Right;    FAMILY HISTORY: Family History  Problem Relation Age of Onset  . Stomach cancer Mother   . Osteoporosis Mother   . Kidney disease Father   . Breast cancer Neg Hx        ADVANCED DIRECTIVES:    HEALTH MAINTENANCE: Social History  Substance Use Topics  . Smoking status: Never Smoker  . Smokeless tobacco: Never Used  . Alcohol use  No     Colonoscopy:  PAP:  Bone density:  Lipid panel:  Allergies  Allergen Reactions  . Clindamycin/Lincomycin Diarrhea and Other (See Comments)    Reaction:  Antibiotic colitis   . Codeine Anxiety    Current  Outpatient Prescriptions  Medication Sig Dispense Refill  . acetaminophen (TYLENOL) 500 MG tablet Take 500-1,000 mg by mouth every 6 (six) hours as needed for mild pain, fever or headache.     . bimatoprost (LUMIGAN) 0.01 % SOLN Place 1 drop into both eyes at bedtime.    Marland Kitchen dexamethasone (DECADRON) 4 MG tablet Take 1 tablet (4 mg total) by mouth daily. 30 tablet 1  . fluticasone (FLONASE) 50 MCG/ACT nasal spray Place 2 sprays into both nostrils daily as needed for rhinitis.     Marland Kitchen lidocaine-prilocaine (EMLA) cream Apply 1 application topically as needed (prior to accessing port).    . LORazepam (ATIVAN) 0.5 MG tablet Take 0.5 mg by mouth 2 (two) times daily.     . meloxicam (MOBIC) 15 MG tablet Take 15 mg by mouth daily.     Marland Kitchen omeprazole (PRILOSEC) 20 MG capsule Take 20 mg by mouth daily.     . ondansetron (ZOFRAN) 8 MG tablet Take 1 tablet (8 mg total) by mouth every 8 (eight) hours as needed for nausea or vomiting. 60 tablet 2  . simvastatin (ZOCOR) 20 MG tablet Take 20 mg by mouth at bedtime.     . timolol (TIMOPTIC) 0.5 % ophthalmic solution      No current facility-administered medications for this visit.    Facility-Administered Medications Ordered in Other Visits  Medication Dose Route Frequency Provider Last Rate Last Dose  . diphenhydrAMINE (BENADRYL) injection 12.5 mg  12.5 mg Intravenous Once Choksi, Delorise Shiner, MD      . methylPREDNISolone sodium succinate (SOLU-MEDROL) 125 mg/2 mL injection 100 mg  100 mg Intravenous Once Choksi, Janak, MD      . sodium chloride flush (NS) 0.9 % injection 10 mL  10 mL Intravenous PRN Charlaine Dalton R, MD   10 mL at 05/20/17 1227    OBJECTIVE: Vitals:   08/01/17 0909  BP: 128/84  Pulse: 88  Resp: 18  Temp: (!) 97.3 F (36.3 C)     Body mass index is 21.13 kg/m.    ECOG FS:1 - Symptomatic but completely ambulatory  General: Thin, no acute distress. Eyes: Pink conjunctiva, anicteric sclera. Lungs: Clear to auscultation bilaterally. Heart:  Regular rate and rhythm. No rubs, murmurs, or gallops. Abdomen: Midline reducible hernia, nontender. Musculoskeletal: No edema, cyanosis, or clubbing. Neuro: Alert, answering all questions appropriately. Cranial nerves grossly intact. Skin: No rashes or petechiae noted. Psych: Normal affect.   LAB RESULTS:  Lab Results  Component Value Date   NA 131 (L) 08/01/2017   K 3.1 (L) 08/01/2017   CL 94 (L) 08/01/2017   CO2 24 08/01/2017   GLUCOSE 139 (H) 08/01/2017   BUN 20 08/01/2017   CREATININE 0.79 08/01/2017   CALCIUM 8.8 (L) 08/01/2017   PROT 6.5 08/01/2017   ALBUMIN 3.3 (L) 08/01/2017   AST 43 (H) 08/01/2017   ALT 30 08/01/2017   ALKPHOS 80 08/01/2017   BILITOT 0.7 08/01/2017   GFRNONAA >60 08/01/2017   GFRAA >60 08/01/2017    Lab Results  Component Value Date   WBC 7.5 08/01/2017   NEUTROABS 2.9 08/01/2017   HGB 11.6 (L) 08/01/2017   HCT 34.4 (L) 08/01/2017   MCV 83.0 08/01/2017   PLT 224 08/01/2017  STUDIES: Dg Chest Port 1 View  Result Date: 07/08/2017 CLINICAL DATA:  Status post left thoracentesis EXAM: PORTABLE CHEST 1 VIEW COMPARISON:  Film from earlier in the same day FINDINGS: There is been near complete reduction of the left pleural effusion when compare with the prior exam. No pneumothorax is noted. Mild interstitial changes in bibasilar atelectatic changes are seen. Right chest wall port is noted in satisfactory position. Cardiac shadow is stable. Aortic calcifications are seen. No bony abnormality is noted. IMPRESSION: Status post left thoracentesis without evidence of pneumothorax. Electronically Signed   By: Inez Catalina M.D.   On: 07/08/2017 15:53   Dg Chest Port 1 View  Result Date: 07/08/2017 CLINICAL DATA:  Short of breath EXAM: PORTABLE CHEST 1 VIEW COMPARISON:  04/17/2016 FINDINGS: Stable right jugular Port-A-Cath. Heart is moderately enlarged. Left pleural effusion has developed. There is shift of the mediastinum to the right. Right lung is  clear. No pneumothorax. IMPRESSION: New moderate left pleural effusion with shift of the mediastinum to the right. Electronically Signed   By: Marybelle Killings M.D.   On: 07/08/2017 14:28   US Thoracentesis Asp Pleural Space W/img Guide  Result Date: 07/08/2017 INDICATION: Left pleural effusion EXAM: ULTRASOUND GUIDED LEFT THORACENTESIS MEDICATIONS: None. COMPLICATIONS: None immediate. PROCEDURE: An ultrasound guided thoracentesis was thoroughly discussed with the patient and questions answered. The benefits, risks, alternatives and complications were also discussed. The patient understands and wishes to proceed with the procedure. Written consent was obtained. Ultrasound was performed to localize and mark an adequate pocket of fluid in the left chest. The area was then prepped and draped in the normal sterile fashion. 1% Lidocaine was used for local anesthesia. Under ultrasound guidance a 6 Fr Safe-T-Centesis catheter was introduced. Thoracentesis was performed. The catheter was removed and a dressing applied. FINDINGS: A total of approximately 1 L of clear yellow fluid was removed. Samples were sent to the laboratory as requested by the clinical team. IMPRESSION: Successful ultrasound guided left thoracentesis yielding 1 L of pleural fluid. Electronically Signed   By: Inez Catalina M.D.   On: 07/08/2017 15:53   Oncology History: Patient previously underwent suboptimal debulking in September 2016. She then completed 3 cycles of carboplatinum, Abraxane, and Avastin between August 15, 2015 and October 31, 2015. She then received 5 total treatments of carboplatinum and Abraxane only between March 13, 2016 and Apr 13, 2016. Treatment was then discontinued per patient preference.   ASSESSMENT: Stage IIIc left ovarian cancer status post suboptimal debulking and chemotherapy.  PLAN:    1. Stage IIIc left ovarian cancer: See oncology history from previous provider as above. CT scan results from June 06, 2017  reviewed independently with concern for recurrent disease. Patient also had a paracentesis on the same day removing 2.9 L of peritoneal fluid. Her CA-125 is now trending down initially at 455, now at 352. Because it has been greater than 1 year since her last treatment, will proceed with the same treatment she received previously which is carboplatinum and Abraxane on day 1 with Abraxane only on days 1, 8, and 15 with day 22 off. Proceed with cycle 1, day 15 of treatment today. Abraxane only. Return to clinic in 2 weeks for consideration of cycle 2, day 1. Plan to reimage at the conclusion of cycle 3. 2. Hypertension: Patient's blood pressure is within normal limits today. Monitor.  3. Nausea and vomiting: Resolved.  4. Weakness and fatigue: Improved. 5. Poor appetite: Improved. Consider dietary consult in the future.  6. Pleural effusion: Patient does not complain of shortness of breath today. Monitor.   Patient expressed understanding and was in agreement with this plan. She also understands that She can call clinic at any time with any questions, concerns, or complaints.     Lloyd Huger, MD   08/02/2017 2:05 PM

## 2017-08-01 ENCOUNTER — Inpatient Hospital Stay (HOSPITAL_BASED_OUTPATIENT_CLINIC_OR_DEPARTMENT_OTHER): Payer: Medicare Other | Admitting: Oncology

## 2017-08-01 ENCOUNTER — Inpatient Hospital Stay: Payer: Medicare Other

## 2017-08-01 VITALS — BP 128/84 | HR 88 | Temp 97.3°F | Resp 18 | Wt 104.6 lb

## 2017-08-01 DIAGNOSIS — C562 Malignant neoplasm of left ovary: Secondary | ICD-10-CM

## 2017-08-01 DIAGNOSIS — R531 Weakness: Secondary | ICD-10-CM | POA: Diagnosis not present

## 2017-08-01 DIAGNOSIS — K219 Gastro-esophageal reflux disease without esophagitis: Secondary | ICD-10-CM

## 2017-08-01 DIAGNOSIS — I10 Essential (primary) hypertension: Secondary | ICD-10-CM

## 2017-08-01 DIAGNOSIS — R5383 Other fatigue: Secondary | ICD-10-CM

## 2017-08-01 DIAGNOSIS — F419 Anxiety disorder, unspecified: Secondary | ICD-10-CM | POA: Diagnosis not present

## 2017-08-01 DIAGNOSIS — Z800 Family history of malignant neoplasm of digestive organs: Secondary | ICD-10-CM | POA: Diagnosis not present

## 2017-08-01 DIAGNOSIS — Z87891 Personal history of nicotine dependence: Secondary | ICD-10-CM | POA: Diagnosis not present

## 2017-08-01 DIAGNOSIS — C569 Malignant neoplasm of unspecified ovary: Secondary | ICD-10-CM

## 2017-08-01 DIAGNOSIS — R630 Anorexia: Secondary | ICD-10-CM

## 2017-08-01 DIAGNOSIS — E7800 Pure hypercholesterolemia, unspecified: Secondary | ICD-10-CM | POA: Diagnosis not present

## 2017-08-01 DIAGNOSIS — J90 Pleural effusion, not elsewhere classified: Secondary | ICD-10-CM

## 2017-08-01 LAB — CBC WITH DIFFERENTIAL/PLATELET
BASOS ABS: 0 10*3/uL (ref 0–0.1)
BASOS PCT: 0 %
EOS PCT: 0 %
Eosinophils Absolute: 0 10*3/uL (ref 0–0.7)
HEMATOCRIT: 34.4 % — AB (ref 35.0–47.0)
Hemoglobin: 11.6 g/dL — ABNORMAL LOW (ref 12.0–16.0)
Lymphocytes Relative: 52 %
Lymphs Abs: 3.8 10*3/uL — ABNORMAL HIGH (ref 1.0–3.6)
MCH: 27.9 pg (ref 26.0–34.0)
MCHC: 33.6 g/dL (ref 32.0–36.0)
MCV: 83 fL (ref 80.0–100.0)
MONO ABS: 0.7 10*3/uL (ref 0.2–0.9)
MONOS PCT: 9 %
NEUTROS ABS: 2.9 10*3/uL (ref 1.4–6.5)
Neutrophils Relative %: 39 %
PLATELETS: 224 10*3/uL (ref 150–440)
RBC: 4.14 MIL/uL (ref 3.80–5.20)
RDW: 15.3 % — AB (ref 11.5–14.5)
WBC: 7.5 10*3/uL (ref 3.6–11.0)

## 2017-08-01 LAB — COMPREHENSIVE METABOLIC PANEL
ALBUMIN: 3.3 g/dL — AB (ref 3.5–5.0)
ALT: 30 U/L (ref 14–54)
ANION GAP: 13 (ref 5–15)
AST: 43 U/L — AB (ref 15–41)
Alkaline Phosphatase: 80 U/L (ref 38–126)
BILIRUBIN TOTAL: 0.7 mg/dL (ref 0.3–1.2)
BUN: 20 mg/dL (ref 6–20)
CO2: 24 mmol/L (ref 22–32)
Calcium: 8.8 mg/dL — ABNORMAL LOW (ref 8.9–10.3)
Chloride: 94 mmol/L — ABNORMAL LOW (ref 101–111)
Creatinine, Ser: 0.79 mg/dL (ref 0.44–1.00)
GFR calc Af Amer: >60 mL/min (ref >60)
Glucose, Bld: 139 mg/dL — ABNORMAL HIGH (ref 65–99)
POTASSIUM: 3.1 mmol/L — AB (ref 3.5–5.1)
Sodium: 131 mmol/L — ABNORMAL LOW (ref 135–145)
TOTAL PROTEIN: 6.5 g/dL (ref 6.5–8.1)

## 2017-08-01 MED ORDER — HEPARIN SOD (PORK) LOCK FLUSH 100 UNIT/ML IV SOLN
500.0000 [IU] | Freq: Once | INTRAVENOUS | Status: AC
  Administered 2017-08-01: 500 [IU] via INTRAVENOUS
  Filled 2017-08-01: qty 5

## 2017-08-01 MED ORDER — PROCHLORPERAZINE MALEATE 10 MG PO TABS
10.0000 mg | ORAL_TABLET | Freq: Once | ORAL | Status: AC
  Administered 2017-08-01: 10 mg via ORAL
  Filled 2017-08-01: qty 1

## 2017-08-01 MED ORDER — PACLITAXEL PROTEIN-BOUND CHEMO INJECTION 100 MG
80.0000 mg/m2 | Freq: Once | INTRAVENOUS | Status: AC
  Administered 2017-08-01: 125 mg via INTRAVENOUS
  Filled 2017-08-01: qty 25

## 2017-08-01 MED ORDER — SODIUM CHLORIDE 0.9% FLUSH
10.0000 mL | Freq: Once | INTRAVENOUS | Status: AC
  Administered 2017-08-01: 10 mL via INTRAVENOUS
  Filled 2017-08-01: qty 10

## 2017-08-01 MED ORDER — SODIUM CHLORIDE 0.9 % IV SOLN
Freq: Once | INTRAVENOUS | Status: AC
  Administered 2017-08-01: 10:00:00 via INTRAVENOUS
  Filled 2017-08-01: qty 1000

## 2017-08-01 NOTE — Progress Notes (Signed)
Patient denies any concerns today.  

## 2017-08-02 ENCOUNTER — Other Ambulatory Visit: Payer: Self-pay | Admitting: Oncology

## 2017-08-02 DIAGNOSIS — Z7189 Other specified counseling: Secondary | ICD-10-CM

## 2017-08-02 LAB — CA 125: CANCER ANTIGEN (CA) 125: 352.4 U/mL — AB (ref 0.0–38.1)

## 2017-08-13 NOTE — Progress Notes (Signed)
[name]  [phone]) [phone] [phone]) [phone]  ID: Kathryn Chandler OB: 1929/06/17  MR#: [id number]  JKD#:[id number]  Patient Care Team: Albina Billet, MD as PCP - General (Internal Medicine) Gillis Ends, MD as Referring Physician (Obstetrics and Gynecology) Ward, Honor Loh, MD as Referring Physician (Obstetrics and Gynecology) Lloyd Huger, MD as Consulting Physician (Oncology)  CHIEF COMPLAINT: Stage IIIc left ovarian cancer status post suboptimal debulking and chemotherapy.  INTERVAL HISTORY: Patient returns to clinic today for further evaluation and consideration of cycle 2, day 1 of carboplatinum and Abraxane. She is tolerating her treatments well without significant side effects. She continues to have weakness and fatigue and remains in a rehabilitation facility. She has no further shortness of breath. She has a good appetite and denies any further nausea.  She has no neurologic complaints. She denies any fevers. She denies any chest pain, cough, or hemoptysis. She has no nausea, vomiting, constipation, or diarrhea. She denies any abdominal bloating.  She has no urinary complaints. Patient offers no further specific complaints today.  REVIEW OF SYSTEMS:   Review of Systems  Constitutional: Positive for malaise/fatigue. Negative for fever and weight loss.  Eyes: Negative for blurred vision.  Respiratory: Negative.  Negative for cough and shortness of breath.   Cardiovascular: Negative.  Negative for chest pain and leg swelling.  Gastrointestinal: Negative for abdominal pain, blood in stool, constipation, diarrhea, melena, nausea and vomiting.  Genitourinary: Negative.   Musculoskeletal: Negative.   Skin: Negative.  Negative for rash.  Neurological: Positive for weakness. Negative for sensory change.  Psychiatric/Behavioral: Negative.  The patient is not nervous/anxious.     As per HPI. Otherwise, a complete review of systems is  negative.  PAST MEDICAL HISTORY: Past Medical History:  Diagnosis Date  . Anxiety   . Cancer ([place])   . GERD (gastroesophageal reflux disease)   . Glaucoma   . Hypercholesteremia   . Hypertension   . Ovarian cancer ([place]) 06/2015   Bilateral Oophorectomies.   Marland Kitchen PONV (postoperative nausea and vomiting)     PAST SURGICAL HISTORY: Past Surgical History:  Procedure Laterality Date  . ABDOMINAL HYSTERECTOMY    . BREAST BIOPSY Right 1970's  . EXPLORATORY LAPAROTOMY W/ BOWEL RESECTION  01/11/2016   XL optimal debulking rectosigmoid resection and reanastomosis  . HAND SURGERY     Right  . LAPAROTOMY Left 07/20/2015   Procedure: LAPAROTOMY with Left Salpingooophrectomy;  Surgeon: Honor Loh Ward, MD;  Location: [facility];  Service: Gynecology;  Laterality: Left;  . LAPAROTOMY N/A 07/20/2015   Procedure: LAPAROTOMY;  Surgeon: Gillis Ends, MD;  Location: [facility];  Service: Gynecology;  Laterality: N/A;  . PERIPHERAL VASCULAR CATHETERIZATION N/A 08/10/2015   Procedure: Glori Luis Cath Insertion;  Surgeon: Algernon Huxley, MD;  Location: [facility] [facility];  Service: Cardiovascular;  Laterality: N/A;  . REPLACEMENT TOTAL KNEE     Right  . SALPINGOOPHORECTOMY Right 07/20/2015   Procedure: SALPINGO OOPHORECTOMY;  Surgeon: Gillis Ends, MD;  Location: [facility];  Service: Gynecology;  Laterality: Right;    FAMILY HISTORY: Family History  Problem Relation Age of Onset  . Stomach cancer Mother   . Osteoporosis Mother   . Kidney disease Father   . Breast cancer Neg Hx        ADVANCED DIRECTIVES:    HEALTH MAINTENANCE: Social History  Substance Use Topics  . Smoking status: Never Smoker  . Smokeless tobacco: Never Used  . Alcohol use No  Colonoscopy:  PAP:  Bone density:  Lipid panel:  Allergies  Allergen Reactions  . Clindamycin/Lincomycin Diarrhea and Other (See Comments)    Reaction:  Antibiotic colitis   . Codeine Anxiety    Current Outpatient  Prescriptions  Medication Sig Dispense Refill  . acetaminophen (TYLENOL) 500 MG tablet Take 500-1,000 mg by mouth every 6 (six) hours as needed for mild pain, fever or headache.     . bimatoprost (LUMIGAN) 0.01 % SOLN Place 1 drop into both eyes at bedtime.    Marland Kitchen dexamethasone (DECADRON) 4 MG tablet Take 1 tablet (4 mg total) by mouth daily. 30 tablet 1  . fluticasone (FLONASE) 50 MCG/ACT nasal spray Place 2 sprays into both nostrils daily as needed for rhinitis.     Marland Kitchen lidocaine-prilocaine (EMLA) cream Apply 1 application topically as needed (prior to accessing port).    . LORazepam (ATIVAN) 0.5 MG tablet Take 0.5 mg by mouth 2 (two) times daily.     . meloxicam (MOBIC) 15 MG tablet Take 15 mg by mouth daily.     Marland Kitchen omeprazole (PRILOSEC) 20 MG capsule Take 20 mg by mouth daily.     . ondansetron (ZOFRAN) 8 MG tablet Take 1 tablet (8 mg total) by mouth every 8 (eight) hours as needed for nausea or vomiting. 60 tablet 2  . simvastatin (ZOCOR) 20 MG tablet Take 20 mg by mouth at bedtime.     . timolol (TIMOPTIC) 0.5 % ophthalmic solution      No current facility-administered medications for this visit.    Facility-Administered Medications Ordered in Other Visits  Medication Dose Route Frequency Provider Last Rate Last Dose  . diphenhydrAMINE (BENADRYL) injection 12.5 mg  12.5 mg Intravenous Once Choksi, Delorise Shiner, MD      . methylPREDNISolone sodium succinate (SOLU-MEDROL) 125 mg/2 mL injection 100 mg  100 mg Intravenous Once Choksi, Janak, MD      . sodium chloride flush (NS) 0.9 % injection 10 mL  10 mL Intravenous PRN Charlaine Dalton R, MD   10 mL at 05/20/17 1227    OBJECTIVE: Vitals:   08/14/17 0926  BP: 116/81  Pulse: 91  Temp: 97.7 F (36.5 C)     There is no height or weight on file to calculate BMI.    ECOG FS:1 - Symptomatic but completely ambulatory  General: Thin, no acute distress. Eyes: Pink conjunctiva, anicteric sclera. Lungs: Clear to auscultation bilaterally. Heart:  Regular rate and rhythm. No rubs, murmurs, or gallops. Abdomen: Midline reducible hernia, nontender. Musculoskeletal: No edema, cyanosis, or clubbing. Neuro: Alert, answering all questions appropriately. Cranial nerves grossly intact. Skin: No rashes or petechiae noted. Psych: Normal affect.   LAB RESULTS:  Lab Results  Component Value Date   NA 135 08/14/2017   K 3.4 (L) 08/14/2017   CL 99 (L) 08/14/2017   CO2 26 08/14/2017   GLUCOSE 120 (H) 08/14/2017   BUN 23 (H) 08/14/2017   CREATININE 0.68 08/14/2017   CALCIUM 8.6 (L) 08/14/2017   PROT 6.5 08/14/2017   ALBUMIN 3.3 (L) 08/14/2017   AST 40 08/14/2017   ALT 38 08/14/2017   ALKPHOS 66 08/14/2017   BILITOT 0.9 08/14/2017   GFRNONAA >60 08/14/2017   GFRAA >60 08/14/2017    Lab Results  Component Value Date   WBC 7.4 08/14/2017   NEUTROABS 3.3 08/14/2017   HGB 11.7 (L) 08/14/2017   HCT 34.7 (L) 08/14/2017   MCV 84.6 08/14/2017   PLT 264 08/14/2017     STUDIES: No  results found. Oncology History: Patient previously underwent suboptimal debulking in September 2016. She then completed 3 cycles of carboplatinum, Abraxane, and Avastin between August 15, 2015 and October 31, 2015. She then received 5 total treatments of carboplatinum and Abraxane only between March 13, 2016 and Apr 13, 2016. Treatment was then discontinued per patient preference.   ASSESSMENT: Stage IIIc left ovarian cancer status post suboptimal debulking and chemotherapy.  PLAN:    1. Stage IIIc left ovarian cancer: See oncology history from previous provider as above. CT scan results from June 06, 2017 reviewed independently with concern for recurrent disease. Patient also had a paracentesis on the same day removing 2.9 L of peritoneal fluid. Her CA-125 is now trending down initially at 455, now at 225. Because it has been greater than 1 year since her last treatment, will proceed with the same treatment she received previously which is carboplatinum  and Abraxane on day 1 with Abraxane only on days 1, 8, and 15 with day 22 off. Proceed with cycle 2, day 1 of treatment today.  Return to clinic in 1 week for consideration of cycle 2, day 8. Plan to reimage at the conclusion of cycle 3. 2. Hypertension: Patient's blood pressure is within normal limits today. Monitor.  3. Nausea and vomiting: Resolved.  4. Weakness and fatigue: Improved. 5. Poor appetite: Improved. Consider dietary consult in the future. 6. Pleural effusion: Patient does not complain of shortness of breath today. Monitor.   Patient expressed understanding and was in agreement with this plan. She also understands that She can call clinic at any time with any questions, concerns, or complaints.     Lloyd Huger, MD   08/18/2017 7:50 AM

## 2017-08-14 ENCOUNTER — Inpatient Hospital Stay: Payer: Medicare Other

## 2017-08-14 ENCOUNTER — Inpatient Hospital Stay (HOSPITAL_BASED_OUTPATIENT_CLINIC_OR_DEPARTMENT_OTHER): Payer: Medicare Other | Admitting: Oncology

## 2017-08-14 ENCOUNTER — Encounter: Payer: Self-pay | Admitting: Oncology

## 2017-08-14 VITALS — BP 116/81 | HR 91 | Temp 97.7°F

## 2017-08-14 VITALS — Wt 103.5 lb

## 2017-08-14 DIAGNOSIS — I10 Essential (primary) hypertension: Secondary | ICD-10-CM

## 2017-08-14 DIAGNOSIS — R5383 Other fatigue: Secondary | ICD-10-CM | POA: Diagnosis not present

## 2017-08-14 DIAGNOSIS — K219 Gastro-esophageal reflux disease without esophagitis: Secondary | ICD-10-CM | POA: Diagnosis not present

## 2017-08-14 DIAGNOSIS — F419 Anxiety disorder, unspecified: Secondary | ICD-10-CM

## 2017-08-14 DIAGNOSIS — C569 Malignant neoplasm of unspecified ovary: Secondary | ICD-10-CM

## 2017-08-14 DIAGNOSIS — R531 Weakness: Secondary | ICD-10-CM | POA: Diagnosis not present

## 2017-08-14 DIAGNOSIS — Z800 Family history of malignant neoplasm of digestive organs: Secondary | ICD-10-CM

## 2017-08-14 DIAGNOSIS — Z90722 Acquired absence of ovaries, bilateral: Secondary | ICD-10-CM

## 2017-08-14 DIAGNOSIS — J90 Pleural effusion, not elsewhere classified: Secondary | ICD-10-CM

## 2017-08-14 DIAGNOSIS — Z90710 Acquired absence of both cervix and uterus: Secondary | ICD-10-CM | POA: Diagnosis not present

## 2017-08-14 DIAGNOSIS — C562 Malignant neoplasm of left ovary: Secondary | ICD-10-CM | POA: Diagnosis not present

## 2017-08-14 DIAGNOSIS — M810 Age-related osteoporosis without current pathological fracture: Secondary | ICD-10-CM | POA: Insufficient documentation

## 2017-08-14 DIAGNOSIS — R630 Anorexia: Secondary | ICD-10-CM

## 2017-08-14 DIAGNOSIS — Z87891 Personal history of nicotine dependence: Secondary | ICD-10-CM | POA: Diagnosis not present

## 2017-08-14 DIAGNOSIS — E7800 Pure hypercholesterolemia, unspecified: Secondary | ICD-10-CM

## 2017-08-14 LAB — CBC WITH DIFFERENTIAL/PLATELET
Basophils Absolute: 0.1 10*3/uL (ref 0–0.1)
Basophils Relative: 1 %
Eosinophils Absolute: 0 10*3/uL (ref 0–0.7)
Eosinophils Relative: 0 %
HEMATOCRIT: 34.7 % — AB (ref 35.0–47.0)
HEMOGLOBIN: 11.7 g/dL — AB (ref 12.0–16.0)
LYMPHS ABS: 3.2 10*3/uL (ref 1.0–3.6)
LYMPHS PCT: 44 %
MCH: 28.5 pg (ref 26.0–34.0)
MCHC: 33.7 g/dL (ref 32.0–36.0)
MCV: 84.6 fL (ref 80.0–100.0)
MONO ABS: 0.7 10*3/uL (ref 0.2–0.9)
MONOS PCT: 10 %
NEUTROS ABS: 3.3 10*3/uL (ref 1.4–6.5)
NEUTROS PCT: 45 %
Platelets: 264 10*3/uL (ref 150–440)
RBC: 4.1 MIL/uL (ref 3.80–5.20)
RDW: 17.8 % — AB (ref 11.5–14.5)
WBC: 7.4 10*3/uL (ref 3.6–11.0)

## 2017-08-14 LAB — COMPREHENSIVE METABOLIC PANEL
ALK PHOS: 66 U/L (ref 38–126)
ALT: 38 U/L (ref 14–54)
ANION GAP: 10 (ref 5–15)
AST: 40 U/L (ref 15–41)
Albumin: 3.3 g/dL — ABNORMAL LOW (ref 3.5–5.0)
BILIRUBIN TOTAL: 0.9 mg/dL (ref 0.3–1.2)
BUN: 23 mg/dL — AB (ref 6–20)
CALCIUM: 8.6 mg/dL — AB (ref 8.9–10.3)
CO2: 26 mmol/L (ref 22–32)
Chloride: 99 mmol/L — ABNORMAL LOW (ref 101–111)
Creatinine, Ser: 0.68 mg/dL (ref 0.44–1.00)
GFR calc Af Amer: >60 mL/min (ref >60)
GLUCOSE: 120 mg/dL — AB (ref 65–99)
POTASSIUM: 3.4 mmol/L — AB (ref 3.5–5.1)
Sodium: 135 mmol/L (ref 135–145)
TOTAL PROTEIN: 6.5 g/dL (ref 6.5–8.1)

## 2017-08-14 MED ORDER — PALONOSETRON HCL INJECTION 0.25 MG/5ML
0.2500 mg | Freq: Once | INTRAVENOUS | Status: AC
  Administered 2017-08-14: 0.25 mg via INTRAVENOUS
  Filled 2017-08-14: qty 5

## 2017-08-14 MED ORDER — SODIUM CHLORIDE 0.9 % IV SOLN
293.5000 mg | Freq: Once | INTRAVENOUS | Status: AC
  Administered 2017-08-14: 290 mg via INTRAVENOUS
  Filled 2017-08-14: qty 29

## 2017-08-14 MED ORDER — SODIUM CHLORIDE 0.9 % IV SOLN
Freq: Once | INTRAVENOUS | Status: AC
  Administered 2017-08-14: 10:00:00 via INTRAVENOUS
  Filled 2017-08-14: qty 1000

## 2017-08-14 MED ORDER — PACLITAXEL PROTEIN-BOUND CHEMO INJECTION 100 MG
80.0000 mg/m2 | Freq: Once | INTRAVENOUS | Status: AC
  Administered 2017-08-14: 125 mg via INTRAVENOUS
  Filled 2017-08-14: qty 25

## 2017-08-14 MED ORDER — DEXAMETHASONE SODIUM PHOSPHATE 10 MG/ML IJ SOLN
10.0000 mg | Freq: Once | INTRAMUSCULAR | Status: AC
  Administered 2017-08-14: 10 mg via INTRAVENOUS
  Filled 2017-08-14: qty 1

## 2017-08-14 MED ORDER — HEPARIN SOD (PORK) LOCK FLUSH 100 UNIT/ML IV SOLN
INTRAVENOUS | Status: AC
  Filled 2017-08-14: qty 5

## 2017-08-14 MED ORDER — HEPARIN SOD (PORK) LOCK FLUSH 100 UNIT/ML IV SOLN
500.0000 [IU] | Freq: Once | INTRAVENOUS | Status: AC | PRN
  Administered 2017-08-14: 500 [IU]

## 2017-08-14 MED ORDER — SODIUM CHLORIDE 0.9 % IV SOLN: 10.0000 mg | Freq: Once | INTRAVENOUS | Status: DC

## 2017-08-15 LAB — CA 125: Cancer Antigen (CA) 125: 225.6 U/mL — ABNORMAL HIGH (ref 0.0–38.1)

## 2017-08-19 NOTE — Progress Notes (Signed)
[place]  [phone] [phone]  ID: [name] OB: 11/06/1929  MR#: [id number]  KPT#:[id number]  Patient Care Team: Albina Billet, MD as PCP - General (Internal Medicine) Gillis Ends, MD as Referring Physician (Obstetrics and Gynecology) Ward, Honor Loh, MD as Referring Physician (Obstetrics and Gynecology) Lloyd Huger, MD as Consulting Physician (Oncology)  CHIEF COMPLAINT: Stage IIIc left ovarian cancer status post suboptimal debulking and chemotherapy.  INTERVAL HISTORY: Patient returns to clinic today for further evaluation and consideration of cycle 2, day 8 of carboplatinum and Abraxane. Abraxane only today. She is tolerating her treatments well without significant side effects. She continues to have weakness and fatigue and remains in a rehabilitation facility. She appears more depressed today. She has no further shortness of breath. She has a good appetite and denies any further nausea.  She has no neurologic complaints. She denies any fevers. She denies any chest pain, cough, or hemoptysis. She has no nausea, vomiting, constipation, or diarrhea. She denies any abdominal bloating.  She has no urinary complaints. Patient offers no further specific complaints today.  REVIEW OF SYSTEMS:   Review of Systems  Constitutional: Positive for malaise/fatigue. Negative for fever and weight loss.  Eyes: Negative for blurred vision.  Respiratory: Negative.  Negative for cough and shortness of breath.   Cardiovascular: Negative.  Negative for chest pain and leg swelling.  Gastrointestinal: Negative for abdominal pain, blood in stool, constipation, diarrhea, melena, nausea and vomiting.  Genitourinary: Negative.   Musculoskeletal: Negative.   Skin: Negative.  Negative for rash.  Neurological: Positive for weakness. Negative for sensory change.  Psychiatric/Behavioral: Positive for depression. The patient is not nervous/anxious.      As per HPI. Otherwise, a complete review of systems is negative.  PAST MEDICAL HISTORY: Past Medical History:  Diagnosis Date  . Anxiety   . Cancer ([place])   . GERD (gastroesophageal reflux disease)   . Glaucoma   . Hypercholesteremia   . Hypertension   . Ovarian cancer ([place]) 06/2015   Bilateral Oophorectomies.   Marland Kitchen PONV (postoperative nausea and vomiting)     PAST SURGICAL HISTORY: Past Surgical History:  Procedure Laterality Date  . ABDOMINAL HYSTERECTOMY    . BREAST BIOPSY Right 1970's  . EXPLORATORY LAPAROTOMY W/ BOWEL RESECTION  01/11/2016   XL optimal debulking rectosigmoid resection and reanastomosis  . HAND SURGERY     Right  . LAPAROTOMY Left 07/20/2015   Procedure: LAPAROTOMY with Left Salpingooophrectomy;  Surgeon: Honor Loh Ward, MD;  Location: [facility];  Service: Gynecology;  Laterality: Left;  . LAPAROTOMY N/A 07/20/2015   Procedure: LAPAROTOMY;  Surgeon: Gillis Ends, MD;  Location: [facility];  Service: Gynecology;  Laterality: N/A;  . PERIPHERAL VASCULAR CATHETERIZATION N/A 08/10/2015   Procedure: Glori Luis Cath Insertion;  Surgeon: Algernon Huxley, MD;  Location: [facility] [facility];  Service: Cardiovascular;  Laterality: N/A;  . REPLACEMENT TOTAL KNEE     Right  . SALPINGOOPHORECTOMY Right 07/20/2015   Procedure: SALPINGO OOPHORECTOMY;  Surgeon: Gillis Ends, MD;  Location: [facility];  Service: Gynecology;  Laterality: Right;    FAMILY HISTORY: Family History  Problem Relation Age of Onset  . Stomach cancer Mother   . Osteoporosis Mother   . Kidney disease Father   . Breast cancer Neg Hx        ADVANCED DIRECTIVES:    HEALTH MAINTENANCE: Social History  Substance Use Topics  . Smoking status: Never Smoker  . Smokeless tobacco:  Never Used  . Alcohol use No     Colonoscopy:  PAP:  Bone density:  Lipid panel:  Allergies  Allergen Reactions  . Clindamycin/Lincomycin Diarrhea and Other (See Comments)    Reaction:  Antibiotic  colitis   . Codeine Anxiety    Current Outpatient Prescriptions  Medication Sig Dispense Refill  . acetaminophen (TYLENOL) 500 MG tablet Take 500-1,000 mg by mouth every 6 (six) hours as needed for mild pain, fever or headache.     . bimatoprost (LUMIGAN) 0.01 % SOLN Place 1 drop into both eyes at bedtime.    Marland Kitchen dexamethasone (DECADRON) 4 MG tablet Take 1 tablet (4 mg total) by mouth daily. 30 tablet 1  . fluticasone (FLONASE) 50 MCG/ACT nasal spray Place 2 sprays into both nostrils daily as needed for rhinitis.     Marland Kitchen lidocaine-prilocaine (EMLA) cream Apply 1 application topically as needed (prior to accessing port).    . LORazepam (ATIVAN) 0.5 MG tablet Take 0.5 mg by mouth 2 (two) times daily.     . meloxicam (MOBIC) 15 MG tablet Take 15 mg by mouth daily.     Marland Kitchen omeprazole (PRILOSEC) 20 MG capsule Take 20 mg by mouth daily.     . simvastatin (ZOCOR) 20 MG tablet Take 20 mg by mouth at bedtime.     . timolol (TIMOPTIC) 0.5 % ophthalmic solution     . ondansetron (ZOFRAN) 8 MG tablet Take 1 tablet (8 mg total) by mouth every 8 (eight) hours as needed for nausea or vomiting. (Patient not taking: Reported on 08/21/2017) 60 tablet 2   No current facility-administered medications for this visit.    Facility-Administered Medications Ordered in Other Visits  Medication Dose Route Frequency Provider Last Rate Last Dose  . diphenhydrAMINE (BENADRYL) injection 12.5 mg  12.5 mg Intravenous Once Choksi, Delorise Shiner, MD      . methylPREDNISolone sodium succinate (SOLU-MEDROL) 125 mg/2 mL injection 100 mg  100 mg Intravenous Once Choksi, Janak, MD      . sodium chloride flush (NS) 0.9 % injection 10 mL  10 mL Intravenous PRN Cammie Sickle, MD   10 mL at 05/20/17 1227    OBJECTIVE: Vitals:   08/21/17 0937  BP: 111/78  Pulse: (!) 101  Temp: 98.1 F (36.7 C)     Body mass index is 20.84 kg/m.    ECOG FS:2 - Symptomatic, <50% confined to bed  General: Thin, no acute distress. Eyes: Pink  conjunctiva, anicteric sclera. Lungs: Clear to auscultation bilaterally. Heart: Regular rate and rhythm. No rubs, murmurs, or gallops. Abdomen: Midline reducible hernia, nontender. Musculoskeletal: No edema, cyanosis, or clubbing. Neuro: Alert, answering all questions appropriately. Cranial nerves grossly intact. Skin: No rashes or petechiae noted. Psych: Normal affect.   LAB RESULTS:  Lab Results  Component Value Date   NA 132 (L) 08/21/2017   K 3.4 (L) 08/21/2017   CL 97 (L) 08/21/2017   CO2 26 08/21/2017   GLUCOSE 119 (H) 08/21/2017   BUN 22 (H) 08/21/2017   CREATININE 0.58 08/21/2017   CALCIUM 8.5 (L) 08/21/2017   PROT 6.2 (L) 08/21/2017   ALBUMIN 3.2 (L) 08/21/2017   AST 32 08/21/2017   ALT 27 08/21/2017   ALKPHOS 66 08/21/2017   BILITOT 1.3 (H) 08/21/2017   GFRNONAA >60 08/21/2017   GFRAA >60 08/21/2017    Lab Results  Component Value Date   WBC 7.5 08/21/2017   NEUTROABS 3.8 08/21/2017   HGB 11.5 (L) 08/21/2017   HCT 33.7 (L)  08/21/2017   MCV 85.6 08/21/2017   PLT 215 08/21/2017     STUDIES: No results found. Oncology History: Patient previously underwent suboptimal debulking in September 2016. She then completed 3 cycles of carboplatinum, Abraxane, and Avastin between August 15, 2015 and October 31, 2015. She then received 5 total treatments of carboplatinum and Abraxane only between March 13, 2016 and Apr 13, 2016. Treatment was then discontinued per patient preference.   ASSESSMENT: Stage IIIc left ovarian cancer status post suboptimal debulking and chemotherapy.  PLAN:    1. Stage IIIc left ovarian cancer: See oncology history from previous provider as above. CT scan results from June 06, 2017 reviewed independently with concern for recurrent disease. Patient also had a paracentesis on the same day removing 2.9 L of peritoneal fluid. Her CA-125 is now trending down initially at 455, now at 225. Because it has been greater than 1 year since her last  treatment, will proceed with the same treatment she received previously which is carboplatinum and Abraxane on day 1 with Abraxane only on days 1, 8, and 15 with day 22 off. Proceed with cycle 2, day 8 of treatment today.  Abraxane only. Return to clinic in 1 week for consideration of cycle 2, day 15. Plan to reimage at the conclusion of cycle 3. 2. Hypertension: Patient's blood pressure is within normal limits today. Monitor.  3. Nausea and vomiting: Resolved.  4. Weakness and fatigue: Improved. 5. Poor appetite: Improved. Consider dietary consult in the future. 6. Pleural effusion: Patient does not complain of shortness of breath today. Monitor. 7. Depression: Possibly related to her continued stay at her rehabilitation with the inability to go home. Monitor. 8. Elevated bilirubin: Mild, monitor.   Patient expressed understanding and was in agreement with this plan. She also understands that She can call clinic at any time with any questions, concerns, or complaints.     Lloyd Huger, MD   08/21/2017 4:13 PM

## 2017-08-21 ENCOUNTER — Inpatient Hospital Stay: Payer: Medicare Other

## 2017-08-21 ENCOUNTER — Inpatient Hospital Stay (HOSPITAL_BASED_OUTPATIENT_CLINIC_OR_DEPARTMENT_OTHER): Payer: Medicare Other | Admitting: Oncology

## 2017-08-21 ENCOUNTER — Inpatient Hospital Stay: Payer: Medicare Other | Attending: Oncology

## 2017-08-21 VITALS — BP 111/78 | HR 101 | Temp 98.1°F | Wt 103.2 lb

## 2017-08-21 DIAGNOSIS — E7800 Pure hypercholesterolemia, unspecified: Secondary | ICD-10-CM

## 2017-08-21 DIAGNOSIS — F419 Anxiety disorder, unspecified: Secondary | ICD-10-CM

## 2017-08-21 DIAGNOSIS — Z5111 Encounter for antineoplastic chemotherapy: Secondary | ICD-10-CM | POA: Diagnosis not present

## 2017-08-21 DIAGNOSIS — C562 Malignant neoplasm of left ovary: Secondary | ICD-10-CM | POA: Diagnosis present

## 2017-08-21 DIAGNOSIS — R5383 Other fatigue: Secondary | ICD-10-CM | POA: Diagnosis not present

## 2017-08-21 DIAGNOSIS — Z79899 Other long term (current) drug therapy: Secondary | ICD-10-CM | POA: Diagnosis not present

## 2017-08-21 DIAGNOSIS — I10 Essential (primary) hypertension: Secondary | ICD-10-CM | POA: Diagnosis not present

## 2017-08-21 DIAGNOSIS — C569 Malignant neoplasm of unspecified ovary: Secondary | ICD-10-CM

## 2017-08-21 DIAGNOSIS — R531 Weakness: Secondary | ICD-10-CM

## 2017-08-21 DIAGNOSIS — F329 Major depressive disorder, single episode, unspecified: Secondary | ICD-10-CM | POA: Diagnosis not present

## 2017-08-21 DIAGNOSIS — Z800 Family history of malignant neoplasm of digestive organs: Secondary | ICD-10-CM

## 2017-08-21 DIAGNOSIS — R630 Anorexia: Secondary | ICD-10-CM | POA: Insufficient documentation

## 2017-08-21 DIAGNOSIS — K219 Gastro-esophageal reflux disease without esophagitis: Secondary | ICD-10-CM

## 2017-08-21 LAB — CBC WITH DIFFERENTIAL/PLATELET
Basophils Absolute: 0 10*3/uL (ref 0–0.1)
Basophils Relative: 0 %
EOS ABS: 0 10*3/uL (ref 0–0.7)
EOS PCT: 0 %
HCT: 33.7 % — ABNORMAL LOW (ref 35.0–47.0)
Hemoglobin: 11.5 g/dL — ABNORMAL LOW (ref 12.0–16.0)
LYMPHS ABS: 3.1 10*3/uL (ref 1.0–3.6)
LYMPHS PCT: 41 %
MCH: 29.1 pg (ref 26.0–34.0)
MCHC: 34 g/dL (ref 32.0–36.0)
MCV: 85.6 fL (ref 80.0–100.0)
MONO ABS: 0.5 10*3/uL (ref 0.2–0.9)
Monocytes Relative: 7 %
Neutro Abs: 3.8 10*3/uL (ref 1.4–6.5)
Neutrophils Relative %: 52 %
PLATELETS: 215 10*3/uL (ref 150–440)
RBC: 3.94 MIL/uL (ref 3.80–5.20)
RDW: 19.3 % — AB (ref 11.5–14.5)
WBC: 7.5 10*3/uL (ref 3.6–11.0)

## 2017-08-21 LAB — COMPREHENSIVE METABOLIC PANEL
ALT: 27 U/L (ref 14–54)
ANION GAP: 9 (ref 5–15)
AST: 32 U/L (ref 15–41)
Albumin: 3.2 g/dL — ABNORMAL LOW (ref 3.5–5.0)
Alkaline Phosphatase: 66 U/L (ref 38–126)
BUN: 22 mg/dL — ABNORMAL HIGH (ref 6–20)
CHLORIDE: 97 mmol/L — AB (ref 101–111)
CO2: 26 mmol/L (ref 22–32)
CREATININE: 0.58 mg/dL (ref 0.44–1.00)
Calcium: 8.5 mg/dL — ABNORMAL LOW (ref 8.9–10.3)
Glucose, Bld: 119 mg/dL — ABNORMAL HIGH (ref 65–99)
POTASSIUM: 3.4 mmol/L — AB (ref 3.5–5.1)
SODIUM: 132 mmol/L — AB (ref 135–145)
Total Bilirubin: 1.3 mg/dL — ABNORMAL HIGH (ref 0.3–1.2)
Total Protein: 6.2 g/dL — ABNORMAL LOW (ref 6.5–8.1)

## 2017-08-21 MED ORDER — SODIUM CHLORIDE 0.9% FLUSH
10.0000 mL | INTRAVENOUS | Status: DC | PRN
  Administered 2017-08-21: 10 mL
  Filled 2017-08-21: qty 10

## 2017-08-21 MED ORDER — PACLITAXEL PROTEIN-BOUND CHEMO INJECTION 100 MG
80.0000 mg/m2 | Freq: Once | INTRAVENOUS | Status: AC
  Administered 2017-08-21: 125 mg via INTRAVENOUS
  Filled 2017-08-21: qty 25

## 2017-08-21 MED ORDER — PROCHLORPERAZINE MALEATE 10 MG PO TABS
10.0000 mg | ORAL_TABLET | Freq: Once | ORAL | Status: AC
  Administered 2017-08-21: 10 mg via ORAL
  Filled 2017-08-21: qty 1

## 2017-08-21 MED ORDER — HEPARIN SOD (PORK) LOCK FLUSH 100 UNIT/ML IV SOLN
500.0000 [IU] | Freq: Once | INTRAVENOUS | Status: AC | PRN
  Administered 2017-08-21: 500 [IU]
  Filled 2017-08-21: qty 5

## 2017-08-21 MED ORDER — SODIUM CHLORIDE 0.9 % IV SOLN
Freq: Once | INTRAVENOUS | Status: AC
  Administered 2017-08-21: 10:00:00 via INTRAVENOUS
  Filled 2017-08-21: qty 1000

## 2017-08-22 LAB — CA 125: Cancer Antigen (CA) 125: 155.4 U/mL — ABNORMAL HIGH (ref 0.0–38.1)

## 2017-08-25 NOTE — Progress Notes (Signed)
[place]  Telephone:(336) [phone] Fax:(336) [phone]  ID: Kathryn Chandler OB: 05-16-1929  MR#: [id number]  QPY#:[id number]  Patient Care Team: Albina Billet, MD as PCP - General (Internal Medicine) Gillis Ends, MD as Referring Physician (Obstetrics and Gynecology) Ward, Honor Loh, MD as Referring Physician (Obstetrics and Gynecology) Lloyd Huger, MD as Consulting Physician (Oncology)  CHIEF COMPLAINT: Stage IIIc left ovarian cancer status post suboptimal debulking and chemotherapy.  INTERVAL HISTORY: Patient returns to clinic today for further evaluation and consideration of cycle 2, day 15 of carboplatinum and Abraxane. Abraxane only today. She is tolerating her treatments well without significant side effects. She continues to have weakness and fatigue and remains in a rehabilitation facility. She continues to be mildly depressed plan is to go home in the near future. She has no further shortness of breath. She has a good appetite and denies any further nausea.  She has no neurologic complaints. She denies any fevers. She denies any chest pain, cough, or hemoptysis. She has no nausea, vomiting, constipation, or diarrhea. She denies any abdominal bloating.  She has no urinary complaints. Patient offers no further specific complaints today.  REVIEW OF SYSTEMS:   Review of Systems  Constitutional: Positive for malaise/fatigue. Negative for fever and weight loss.  Eyes: Negative for blurred vision.  Respiratory: Negative.  Negative for cough and shortness of breath.   Cardiovascular: Negative.  Negative for chest pain and leg swelling.  Gastrointestinal: Negative for abdominal pain, blood in stool, constipation, diarrhea, melena, nausea and vomiting.  Genitourinary: Negative.   Musculoskeletal: Negative.   Skin: Negative.  Negative for rash.  Neurological: Positive for weakness. Negative for sensory change.  Psychiatric/Behavioral: Positive for  depression. The patient is not nervous/anxious.     As per HPI. Otherwise, a complete review of systems is negative.  PAST MEDICAL HISTORY: Past Medical History:  Diagnosis Date  . Anxiety   . Cancer ([place])   . GERD (gastroesophageal reflux disease)   . Glaucoma   . Hypercholesteremia   . Hypertension   . Ovarian cancer ([place]) 06/2015   Bilateral Oophorectomies.   Marland Kitchen PONV (postoperative nausea and vomiting)     PAST SURGICAL HISTORY: Past Surgical History:  Procedure Laterality Date  . ABDOMINAL HYSTERECTOMY    . BREAST BIOPSY Right 1970's  . EXPLORATORY LAPAROTOMY W/ BOWEL RESECTION  01/11/2016   XL optimal debulking rectosigmoid resection and reanastomosis  . HAND SURGERY     Right  . LAPAROTOMY Left 07/20/2015   Procedure: LAPAROTOMY with Left Salpingooophrectomy;  Surgeon: Honor Loh Ward, MD;  Location: [facility];  Service: Gynecology;  Laterality: Left;  . LAPAROTOMY N/A 07/20/2015   Procedure: LAPAROTOMY;  Surgeon: Gillis Ends, MD;  Location: [facility];  Service: Gynecology;  Laterality: N/A;  . PERIPHERAL VASCULAR CATHETERIZATION N/A 08/10/2015   Procedure: Glori Luis Cath Insertion;  Surgeon: Algernon Huxley, MD;  Location: [place] [facility];  Service: Cardiovascular;  Laterality: N/A;  . REPLACEMENT TOTAL KNEE     Right  . SALPINGOOPHORECTOMY Right 07/20/2015   Procedure: SALPINGO OOPHORECTOMY;  Surgeon: Gillis Ends, MD;  Location: [facility];  Service: Gynecology;  Laterality: Right;    FAMILY HISTORY: Family History  Problem Relation Age of Onset  . Stomach cancer Mother   . Osteoporosis Mother   . Kidney disease Father   . Breast cancer Neg Hx        ADVANCED DIRECTIVES:    HEALTH MAINTENANCE: Social History  Substance Use Topics  .  Smoking status: Never Smoker  . Smokeless tobacco: Never Used  . Alcohol use No     Colonoscopy:  PAP:  Bone density:  Lipid panel:  Allergies  Allergen Reactions  . Clindamycin/Lincomycin Diarrhea and  Other (See Comments)    Reaction:  Antibiotic colitis   . Codeine Anxiety    Current Outpatient Prescriptions  Medication Sig Dispense Refill  . bimatoprost (LUMIGAN) 0.01 % SOLN Place 1 drop into both eyes at bedtime.    . lidocaine-prilocaine (EMLA) cream Apply 1 application topically as needed (prior to accessing port).    . LORazepam (ATIVAN) 0.5 MG tablet Take 0.5 mg by mouth 2 (two) times daily.     . meloxicam (MOBIC) 15 MG tablet Take 15 mg by mouth daily.     Marland Kitchen omeprazole (PRILOSEC) 20 MG capsule Take 20 mg by mouth daily.     . simvastatin (ZOCOR) 20 MG tablet Take 20 mg by mouth at bedtime.     Marland Kitchen acetaminophen (TYLENOL) 500 MG tablet Take 500-1,000 mg by mouth every 6 (six) hours as needed for mild pain, fever or headache.     . dexamethasone (DECADRON) 4 MG tablet Take 1 tablet (4 mg total) by mouth daily. (Patient not taking: Reported on 08/28/2017) 30 tablet 1  . fluticasone (FLONASE) 50 MCG/ACT nasal spray Place 2 sprays into both nostrils daily as needed for rhinitis.     Marland Kitchen ondansetron (ZOFRAN) 8 MG tablet Take 1 tablet (8 mg total) by mouth every 8 (eight) hours as needed for nausea or vomiting. (Patient not taking: Reported on 08/21/2017) 60 tablet 2  . timolol (TIMOPTIC) 0.5 % ophthalmic solution      No current facility-administered medications for this visit.    Facility-Administered Medications Ordered in Other Visits  Medication Dose Route Frequency Provider Last Rate Last Dose  . diphenhydrAMINE (BENADRYL) injection 12.5 mg  12.5 mg Intravenous Once Choksi, Delorise Shiner, MD      . methylPREDNISolone sodium succinate (SOLU-MEDROL) 125 mg/2 mL injection 100 mg  100 mg Intravenous Once Choksi, Janak, MD      . sodium chloride flush (NS) 0.9 % injection 10 mL  10 mL Intravenous PRN Charlaine Dalton R, MD   10 mL at 05/20/17 1227    OBJECTIVE: Vitals:   08/28/17 0948  BP: 123/73  Pulse: 94  Resp: 18  Temp: (!) 97.5 F (36.4 C)     Body mass index is 21.45 kg/m.     ECOG FS:2 - Symptomatic, <50% confined to bed  General: Thin, no acute distress. Eyes: Pink conjunctiva, anicteric sclera. Lungs: Clear to auscultation bilaterally. Heart: Regular rate and rhythm. No rubs, murmurs, or gallops. Abdomen: Midline reducible hernia, nontender. Musculoskeletal: No edema, cyanosis, or clubbing. Neuro: Alert, answering all questions appropriately. Cranial nerves grossly intact. Skin: No rashes or petechiae noted. Psych: Normal affect.   LAB RESULTS:  Lab Results  Component Value Date   NA 134 (L) 08/28/2017   K 3.4 (L) 08/28/2017   CL 98 (L) 08/28/2017   CO2 24 08/28/2017   GLUCOSE 115 (H) 08/28/2017   BUN 32 (H) 08/28/2017   CREATININE 0.61 08/28/2017   CALCIUM 8.4 (L) 08/28/2017   PROT 5.8 (L) 08/28/2017   ALBUMIN 3.0 (L) 08/28/2017   AST 32 08/28/2017   ALT 31 08/28/2017   ALKPHOS 61 08/28/2017   BILITOT 0.7 08/28/2017   GFRNONAA >60 08/28/2017   GFRAA >60 08/28/2017    Lab Results  Component Value Date   WBC 4.9 08/28/2017  NEUTROABS 1.5 08/28/2017   HGB 10.6 (L) 08/28/2017   HCT 31.8 (L) 08/28/2017   MCV 86.3 08/28/2017   PLT 124 (L) 08/28/2017     STUDIES: No results found. Oncology History: Patient previously underwent suboptimal debulking in September 2016. She then completed 3 cycles of carboplatinum, Abraxane, and Avastin between August 15, 2015 and October 31, 2015. She then received 5 total treatments of carboplatinum and Abraxane only between March 13, 2016 and Apr 13, 2016. Treatment was then discontinued per patient preference.   ASSESSMENT: Stage IIIc left ovarian cancer status post suboptimal debulking and chemotherapy.  PLAN:    1. Stage IIIc left ovarian cancer: See oncology history from previous provider as above. CT scan results from June 06, 2017 reviewed independently with concern for recurrent disease. Patient also had a paracentesis on the same day removing 2.9 L of peritoneal fluid. Her CA-125 is now  trending down initially at 455, now at 108.4. Because it has been greater than 1 year since her last treatment, will proceed with the same treatment she received previously which is carboplatinum and Abraxane on day 1 with Abraxane only on days 1, 8, and 15 with day 22 off. Proceed with cycle 2, day 15 of treatment today.  Abraxane only. Return to clinic in 2 weeks for consideration of cycle 3, day 1. Plan to reimage at the conclusion of cycle 3. 2. Hypertension: Patient's blood pressure is within normal limits today. Monitor.  3. Nausea and vomiting: Resolved.  4. Weakness and fatigue: Improved. 5. Poor appetite: Improved. Consider dietary consult in the future. 6. Pleural effusion: Patient does not complain of shortness of breath today. Monitor. 7. Depression: Possibly related to her continued stay at her rehabilitation with the inability to go home. Monitor. 8. Elevated bilirubin: Resolved.   Patient expressed understanding and was in agreement with this plan. She also understands that She can call clinic at any time with any questions, concerns, or complaints.     Lloyd Huger, MD   08/31/2017 7:03 AM

## 2017-08-28 ENCOUNTER — Inpatient Hospital Stay: Payer: Medicare Other

## 2017-08-28 ENCOUNTER — Inpatient Hospital Stay (HOSPITAL_BASED_OUTPATIENT_CLINIC_OR_DEPARTMENT_OTHER): Payer: Medicare Other | Admitting: Oncology

## 2017-08-28 VITALS — BP 123/73 | HR 94 | Temp 97.5°F | Resp 18 | Wt 106.2 lb

## 2017-08-28 DIAGNOSIS — I10 Essential (primary) hypertension: Secondary | ICD-10-CM

## 2017-08-28 DIAGNOSIS — E7800 Pure hypercholesterolemia, unspecified: Secondary | ICD-10-CM

## 2017-08-28 DIAGNOSIS — R531 Weakness: Secondary | ICD-10-CM | POA: Diagnosis not present

## 2017-08-28 DIAGNOSIS — F419 Anxiety disorder, unspecified: Secondary | ICD-10-CM | POA: Diagnosis not present

## 2017-08-28 DIAGNOSIS — Z800 Family history of malignant neoplasm of digestive organs: Secondary | ICD-10-CM | POA: Diagnosis not present

## 2017-08-28 DIAGNOSIS — C569 Malignant neoplasm of unspecified ovary: Secondary | ICD-10-CM

## 2017-08-28 DIAGNOSIS — K219 Gastro-esophageal reflux disease without esophagitis: Secondary | ICD-10-CM | POA: Diagnosis not present

## 2017-08-28 DIAGNOSIS — C562 Malignant neoplasm of left ovary: Secondary | ICD-10-CM

## 2017-08-28 DIAGNOSIS — R5383 Other fatigue: Secondary | ICD-10-CM | POA: Diagnosis not present

## 2017-08-28 DIAGNOSIS — Z79899 Other long term (current) drug therapy: Secondary | ICD-10-CM | POA: Diagnosis not present

## 2017-08-28 DIAGNOSIS — F329 Major depressive disorder, single episode, unspecified: Secondary | ICD-10-CM

## 2017-08-28 LAB — CBC WITH DIFFERENTIAL/PLATELET
Basophils Absolute: 0 10*3/uL (ref 0–0.1)
Basophils Relative: 0 %
EOS ABS: 0 10*3/uL (ref 0–0.7)
Eosinophils Relative: 0 %
HCT: 31.8 % — ABNORMAL LOW (ref 35.0–47.0)
HEMOGLOBIN: 10.6 g/dL — AB (ref 12.0–16.0)
LYMPHS ABS: 2.9 10*3/uL (ref 1.0–3.6)
Lymphocytes Relative: 61 %
MCH: 28.8 pg (ref 26.0–34.0)
MCHC: 33.4 g/dL (ref 32.0–36.0)
MCV: 86.3 fL (ref 80.0–100.0)
Monocytes Absolute: 0.4 10*3/uL (ref 0.2–0.9)
Monocytes Relative: 9 %
NEUTROS PCT: 30 %
Neutro Abs: 1.5 10*3/uL (ref 1.4–6.5)
Platelets: 124 10*3/uL — ABNORMAL LOW (ref 150–440)
RBC: 3.68 MIL/uL — AB (ref 3.80–5.20)
RDW: 20.6 % — ABNORMAL HIGH (ref 11.5–14.5)
WBC: 4.9 10*3/uL (ref 3.6–11.0)

## 2017-08-28 LAB — COMPREHENSIVE METABOLIC PANEL
ALK PHOS: 61 U/L (ref 38–126)
ALT: 31 U/L (ref 14–54)
ANION GAP: 12 (ref 5–15)
AST: 32 U/L (ref 15–41)
Albumin: 3 g/dL — ABNORMAL LOW (ref 3.5–5.0)
BILIRUBIN TOTAL: 0.7 mg/dL (ref 0.3–1.2)
BUN: 32 mg/dL — ABNORMAL HIGH (ref 6–20)
CALCIUM: 8.4 mg/dL — AB (ref 8.9–10.3)
CO2: 24 mmol/L (ref 22–32)
Chloride: 98 mmol/L — ABNORMAL LOW (ref 101–111)
Creatinine, Ser: 0.61 mg/dL (ref 0.44–1.00)
GLUCOSE: 115 mg/dL — AB (ref 65–99)
Potassium: 3.4 mmol/L — ABNORMAL LOW (ref 3.5–5.1)
Sodium: 134 mmol/L — ABNORMAL LOW (ref 135–145)
TOTAL PROTEIN: 5.8 g/dL — AB (ref 6.5–8.1)

## 2017-08-28 MED ORDER — SODIUM CHLORIDE 0.9 % IV SOLN
Freq: Once | INTRAVENOUS | Status: AC
  Administered 2017-08-28: 11:00:00 via INTRAVENOUS
  Filled 2017-08-28: qty 1000

## 2017-08-28 MED ORDER — SODIUM CHLORIDE 0.9% FLUSH
10.0000 mL | INTRAVENOUS | Status: DC | PRN
  Administered 2017-08-28: 10 mL via INTRAVENOUS
  Filled 2017-08-28: qty 10

## 2017-08-28 MED ORDER — PROCHLORPERAZINE MALEATE 10 MG PO TABS
10.0000 mg | ORAL_TABLET | Freq: Once | ORAL | Status: AC
Start: 2017-08-28 — End: 2017-08-28
  Administered 2017-08-28: 10 mg via ORAL
  Filled 2017-08-28: qty 1

## 2017-08-28 MED ORDER — PACLITAXEL PROTEIN-BOUND CHEMO INJECTION 100 MG
80.0000 mg/m2 | Freq: Once | INTRAVENOUS | Status: AC
  Administered 2017-08-28: 125 mg via INTRAVENOUS
  Filled 2017-08-28: qty 25

## 2017-08-28 MED ORDER — HEPARIN SOD (PORK) LOCK FLUSH 100 UNIT/ML IV SOLN
500.0000 [IU] | Freq: Once | INTRAVENOUS | Status: AC
  Administered 2017-08-28: 500 [IU] via INTRAVENOUS
  Filled 2017-08-28: qty 5

## 2017-08-28 NOTE — Progress Notes (Signed)
Here for follow up. Inw/c quiet w family member given most information. Plan is to move in w son within 3 wks. No voiced c/o

## 2017-08-29 LAB — CA 125: Cancer Antigen (CA) 125: 108.4 U/mL — ABNORMAL HIGH (ref 0.0–38.1)

## 2017-09-10 NOTE — Progress Notes (Signed)
[facility]  [phone] Fax:([phone]  ID: Kathryn Chandler OB: February 10, 1929  MR#: [id number]  CWC#:[id number]  Patient Care Team: Albina Billet, MD as PCP - General (Internal Medicine) Gillis Ends, MD as Referring Physician (Obstetrics and Gynecology) Ward, Honor Loh, MD as Referring Physician (Obstetrics and Gynecology) Lloyd Huger, MD as Consulting Physician (Oncology)  CHIEF COMPLAINT: Stage IIIc left ovarian cancer status post suboptimal debulking and chemotherapy.  INTERVAL HISTORY: Patient returns to clinic today for further evaluation and consideration of cycle 3, day 1 of carboplatinum and Abraxane. She is tolerating her treatments well without significant side effects. She continues to have weakness and fatigue and remains in a rehabilitation facility. She continues to be depressed. She has no further shortness of breath. She has a poor appetite, but  denies any further nausea.  She has no neurologic complaints. She denies any fevers. She denies any chest pain, cough, or hemoptysis. She has no nausea, vomiting, constipation, or diarrhea. She denies any abdominal bloating.  She has no urinary complaints. Patient offers no further specific complaints today.  REVIEW OF SYSTEMS:   Review of Systems  Constitutional: Positive for malaise/fatigue. Negative for fever and weight loss.  Eyes: Negative for blurred vision.  Respiratory: Negative.  Negative for cough and shortness of breath.   Cardiovascular: Negative.  Negative for chest pain and leg swelling.  Gastrointestinal: Negative for abdominal pain, blood in stool, constipation, diarrhea, melena, nausea and vomiting.  Genitourinary: Negative.   Musculoskeletal: Negative.   Skin: Negative.  Negative for rash.  Neurological: Positive for weakness. Negative for sensory change.  Psychiatric/Behavioral: Positive for depression. The patient is not nervous/anxious.     As per HPI.  Otherwise, a complete review of systems is negative.  PAST MEDICAL HISTORY: Past Medical History:  Diagnosis Date  . Anxiety   . Cancer ([place])   . GERD (gastroesophageal reflux disease)   . Glaucoma   . Hypercholesteremia   . Hypertension   . Ovarian cancer ([place]) 06/2015   Bilateral Oophorectomies.   Marland Kitchen PONV (postoperative nausea and vomiting)     PAST SURGICAL HISTORY: Past Surgical History:  Procedure Laterality Date  . ABDOMINAL HYSTERECTOMY    . BREAST BIOPSY Right 1970's  . EXPLORATORY LAPAROTOMY W/ BOWEL RESECTION  01/11/2016   XL optimal debulking rectosigmoid resection and reanastomosis  . HAND SURGERY     Right  . LAPAROTOMY Left 07/20/2015   Procedure: LAPAROTOMY with Left Salpingooophrectomy;  Surgeon: Honor Loh Ward, MD;  Location: [facility];  Service: Gynecology;  Laterality: Left;  . LAPAROTOMY N/A 07/20/2015   Procedure: LAPAROTOMY;  Surgeon: Gillis Ends, MD;  Location: [facility];  Service: Gynecology;  Laterality: N/A;  . PERIPHERAL VASCULAR CATHETERIZATION N/A 08/10/2015   Procedure: Glori Luis Cath Insertion;  Surgeon: Algernon Huxley, MD;  Location: [facility];  Service: Cardiovascular;  Laterality: N/A;  . REPLACEMENT TOTAL KNEE     Right  . SALPINGOOPHORECTOMY Right 07/20/2015   Procedure: SALPINGO OOPHORECTOMY;  Surgeon: Gillis Ends, MD;  Location: [facility];  Service: Gynecology;  Laterality: Right;    FAMILY HISTORY: Family History  Problem Relation Age of Onset  . Stomach cancer Mother   . Osteoporosis Mother   . Kidney disease Father   . Breast cancer Neg Hx        ADVANCED DIRECTIVES:    HEALTH MAINTENANCE: Social History  Substance Use Topics  . Smoking status: Never Smoker  . Smokeless tobacco: Never Used  .  Alcohol use No     Colonoscopy:  PAP:  Bone density:  Lipid panel:  Allergies  Allergen Reactions  . Clindamycin/Lincomycin Diarrhea and Other (See Comments)    Reaction:  Antibiotic colitis   . Codeine  Anxiety    Current Outpatient Prescriptions  Medication Sig Dispense Refill  . acetaminophen (TYLENOL) 500 MG tablet Take 500-1,000 mg by mouth every 6 (six) hours as needed for mild pain, fever or headache.     . bimatoprost (LUMIGAN) 0.01 % SOLN Place 1 drop into both eyes at bedtime.    . fluticasone (FLONASE) 50 MCG/ACT nasal spray Place 2 sprays into both nostrils daily as needed for rhinitis.     Marland Kitchen lidocaine-prilocaine (EMLA) cream Apply 1 application topically as needed (prior to accessing port).    . LORazepam (ATIVAN) 0.5 MG tablet Take 0.5 mg by mouth 2 (two) times daily.     . meloxicam (MOBIC) 15 MG tablet Take 15 mg by mouth daily.     Marland Kitchen omeprazole (PRILOSEC) 20 MG capsule Take 20 mg by mouth daily.     . simvastatin (ZOCOR) 20 MG tablet Take 20 mg by mouth at bedtime.     . timolol (TIMOPTIC) 0.5 % ophthalmic solution     . citalopram (CELEXA) 20 MG tablet Take 1 tablet (20 mg total) by mouth daily. 30 tablet 2  . dexamethasone (DECADRON) 4 MG tablet Take 1 tablet (4 mg total) by mouth daily. (Patient not taking: Reported on 08/28/2017) 30 tablet 1  . ondansetron (ZOFRAN) 8 MG tablet Take 1 tablet (8 mg total) by mouth every 8 (eight) hours as needed for nausea or vomiting. (Patient not taking: Reported on 08/21/2017) 60 tablet 2   No current facility-administered medications for this visit.    Facility-Administered Medications Ordered in Other Visits  Medication Dose Route Frequency Provider Last Rate Last Dose  . 0.9 %  sodium chloride infusion   Intravenous Once Lloyd Huger, MD      . CARBOplatin (PARAPLATIN) 290 mg in sodium chloride 0.9 % 250 mL chemo infusion  290 mg Intravenous Once Lloyd Huger, MD      . dexamethasone (DECADRON) injection 10 mg  10 mg Intravenous Once Lloyd Huger, MD      . diphenhydrAMINE (BENADRYL) injection 12.5 mg  12.5 mg Intravenous Once Choksi, Delorise Shiner, MD      . heparin lock flush 100 unit/mL  500 Units Intracatheter Once  PRN Lloyd Huger, MD      . methylPREDNISolone sodium succinate (SOLU-MEDROL) 125 mg/2 mL injection 100 mg  100 mg Intravenous Once Choksi, Delorise Shiner, MD      . PACLitaxel-protein bound (ABRAXANE) chemo infusion 125 mg  80 mg/m2 (Treatment Plan Recorded) Intravenous Once Lloyd Huger, MD      . palonosetron (ALOXI) injection 0.25 mg  0.25 mg Intravenous Once Lloyd Huger, MD      . sodium chloride flush (NS) 0.9 % injection 10 mL  10 mL Intravenous PRN Cammie Sickle, MD   10 mL at 05/20/17 1227  . sodium chloride flush (NS) 0.9 % injection 10 mL  10 mL Intracatheter PRN Lloyd Huger, MD        OBJECTIVE: Vitals:   09/11/17 1003  BP: 106/72  Pulse: (!) 111  Resp: 18  Temp: 98.2 F (36.8 C)     There is no height or weight on file to calculate BMI.    ECOG FS:2 - Symptomatic, <50% confined to  bed  General: Thin, no acute distress. Eyes: Pink conjunctiva, anicteric sclera. Lungs: Clear to auscultation bilaterally. Heart: Regular rate and rhythm. No rubs, murmurs, or gallops. Abdomen: Midline reducible hernia, nontender. Musculoskeletal: No edema, cyanosis, or clubbing. Neuro: Alert, answering all questions appropriately. Cranial nerves grossly intact. Skin: No rashes or petechiae noted. Psych: Flat affect.   LAB RESULTS:  Lab Results  Component Value Date   NA 133 (L) 09/11/2017   K 3.6 09/11/2017   CL 97 (L) 09/11/2017   CO2 23 09/11/2017   GLUCOSE 144 (H) 09/11/2017   BUN 29 (H) 09/11/2017   CREATININE 0.63 09/11/2017   CALCIUM 8.3 (L) 09/11/2017   PROT 6.0 (L) 09/11/2017   ALBUMIN 3.0 (L) 09/11/2017   AST 37 09/11/2017   ALT 39 09/11/2017   ALKPHOS 61 09/11/2017   BILITOT 1.0 09/11/2017   GFRNONAA >60 09/11/2017   GFRAA >60 09/11/2017    Lab Results  Component Value Date   WBC 10.9 09/11/2017   NEUTROABS 5.8 09/11/2017   HGB 12.3 09/11/2017   HCT 37.0 09/11/2017   MCV 88.7 09/11/2017   PLT 292 09/11/2017     STUDIES: No  results found.  Oncology History: Patient previously underwent suboptimal debulking in September 2016. She then completed 3 cycles of carboplatinum, Abraxane, and Avastin between August 15, 2015 and October 31, 2015. She then received 5 total treatments of carboplatinum and Abraxane only between March 13, 2016 and Apr 13, 2016. Treatment was then discontinued per patient preference.   ASSESSMENT: Stage IIIc left ovarian cancer status post suboptimal debulking and chemotherapy.  PLAN:    1. Stage IIIc left ovarian cancer: See oncology history from previous provider as above. CT scan results from June 06, 2017 reviewed independently with concern for recurrent disease. Patient also had a paracentesis on the same day removing 2.9 L of peritoneal fluid. Her CA-125 is now trending down initially at 455, now at 108.4. Because it has been greater than 1 year since her last treatment, will proceed with the same treatment she received previously which is carboplatinum and Abraxane on day 1 with Abraxane only on days 1, 8, and 15 with day 22 off. Proceed with cycle 3, day 1 of treatment today.  Return to clinic in 1 week for consideration of cycle 3, day 8. Abraxane only. Plan to reimage at the conclusion of cycle 3. 2. Hypertension: Patient's blood pressure is within normal limits today. Monitor.  3. Nausea and vomiting: Resolved.  4. Weakness and fatigue: Multifactorial, including secondary to depression. 5. Poor appetite: Multifactorial, including secondary to depression. Consider dietary consult in the future. 6. Pleural effusion: Patient does not complain of shortness of breath today. Monitor. 7. Depression: Patient agreed to initiate Celexa 20 mg daily today.  8. Elevated bilirubin: Resolved.   Patient expressed understanding and was in agreement with this plan. She also understands that She can call clinic at any time with any questions, concerns, or complaints.     Lloyd Huger, MD    09/11/2017 10:58 AM

## 2017-09-11 ENCOUNTER — Inpatient Hospital Stay: Payer: Medicare Other

## 2017-09-11 ENCOUNTER — Inpatient Hospital Stay (HOSPITAL_BASED_OUTPATIENT_CLINIC_OR_DEPARTMENT_OTHER): Payer: Medicare Other | Admitting: Oncology

## 2017-09-11 VITALS — BP 106/72 | HR 111 | Temp 98.2°F | Resp 18

## 2017-09-11 DIAGNOSIS — R531 Weakness: Secondary | ICD-10-CM

## 2017-09-11 DIAGNOSIS — C569 Malignant neoplasm of unspecified ovary: Secondary | ICD-10-CM

## 2017-09-11 DIAGNOSIS — K219 Gastro-esophageal reflux disease without esophagitis: Secondary | ICD-10-CM

## 2017-09-11 DIAGNOSIS — I10 Essential (primary) hypertension: Secondary | ICD-10-CM

## 2017-09-11 DIAGNOSIS — F419 Anxiety disorder, unspecified: Secondary | ICD-10-CM | POA: Diagnosis not present

## 2017-09-11 DIAGNOSIS — Z800 Family history of malignant neoplasm of digestive organs: Secondary | ICD-10-CM | POA: Diagnosis not present

## 2017-09-11 DIAGNOSIS — C562 Malignant neoplasm of left ovary: Secondary | ICD-10-CM

## 2017-09-11 DIAGNOSIS — F329 Major depressive disorder, single episode, unspecified: Secondary | ICD-10-CM | POA: Diagnosis not present

## 2017-09-11 DIAGNOSIS — Z79899 Other long term (current) drug therapy: Secondary | ICD-10-CM

## 2017-09-11 DIAGNOSIS — E7800 Pure hypercholesterolemia, unspecified: Secondary | ICD-10-CM

## 2017-09-11 DIAGNOSIS — R5383 Other fatigue: Secondary | ICD-10-CM

## 2017-09-11 LAB — COMPREHENSIVE METABOLIC PANEL
ALBUMIN: 3 g/dL — AB (ref 3.5–5.0)
ALK PHOS: 61 U/L (ref 38–126)
ALT: 39 U/L (ref 14–54)
AST: 37 U/L (ref 15–41)
Anion gap: 13 (ref 5–15)
BILIRUBIN TOTAL: 1 mg/dL (ref 0.3–1.2)
BUN: 29 mg/dL — AB (ref 6–20)
CALCIUM: 8.3 mg/dL — AB (ref 8.9–10.3)
CO2: 23 mmol/L (ref 22–32)
Chloride: 97 mmol/L — ABNORMAL LOW (ref 101–111)
Creatinine, Ser: 0.63 mg/dL (ref 0.44–1.00)
GFR calc Af Amer: >60 mL/min (ref >60)
GFR calc non Af Amer: >60 mL/min (ref >60)
GLUCOSE: 144 mg/dL — AB (ref 65–99)
Potassium: 3.6 mmol/L (ref 3.5–5.1)
Sodium: 133 mmol/L — ABNORMAL LOW (ref 135–145)
TOTAL PROTEIN: 6 g/dL — AB (ref 6.5–8.1)

## 2017-09-11 LAB — CBC WITH DIFFERENTIAL/PLATELET
BASOS ABS: 0.1 10*3/uL (ref 0–0.1)
BASOS PCT: 1 %
Eosinophils Absolute: 0 10*3/uL (ref 0–0.7)
Eosinophils Relative: 0 %
HEMATOCRIT: 37 % (ref 35.0–47.0)
HEMOGLOBIN: 12.3 g/dL (ref 12.0–16.0)
Lymphocytes Relative: 35 %
Lymphs Abs: 3.8 10*3/uL — ABNORMAL HIGH (ref 1.0–3.6)
MCH: 29.4 pg (ref 26.0–34.0)
MCHC: 33.2 g/dL (ref 32.0–36.0)
MCV: 88.7 fL (ref 80.0–100.0)
MONOS PCT: 11 %
Monocytes Absolute: 1.2 10*3/uL — ABNORMAL HIGH (ref 0.2–0.9)
NEUTROS ABS: 5.8 10*3/uL (ref 1.4–6.5)
NEUTROS PCT: 53 %
Platelets: 292 10*3/uL (ref 150–440)
RBC: 4.17 MIL/uL (ref 3.80–5.20)
RDW: 25.3 % — ABNORMAL HIGH (ref 11.5–14.5)
WBC: 10.9 10*3/uL (ref 3.6–11.0)

## 2017-09-11 MED ORDER — CITALOPRAM HYDROBROMIDE 20 MG PO TABS: 20.0000 mg | ORAL_TABLET | Freq: Every day | ORAL | 2 refills | Status: AC

## 2017-09-11 MED ORDER — SODIUM CHLORIDE 0.9 % IV SOLN
Freq: Once | INTRAVENOUS | Status: AC
  Administered 2017-09-11: 11:00:00 via INTRAVENOUS
  Filled 2017-09-11: qty 1000

## 2017-09-11 MED ORDER — SODIUM CHLORIDE 0.9 % IV SOLN
293.5000 mg | Freq: Once | INTRAVENOUS | Status: AC
  Administered 2017-09-11: 290 mg via INTRAVENOUS
  Filled 2017-09-11: qty 29

## 2017-09-11 MED ORDER — HEPARIN SOD (PORK) LOCK FLUSH 100 UNIT/ML IV SOLN
500.0000 [IU] | Freq: Once | INTRAVENOUS | Status: AC | PRN
  Administered 2017-09-11: 500 [IU]
  Filled 2017-09-11: qty 5

## 2017-09-11 MED ORDER — PACLITAXEL PROTEIN-BOUND CHEMO INJECTION 100 MG
80.0000 mg/m2 | Freq: Once | INTRAVENOUS | Status: AC
  Administered 2017-09-11: 125 mg via INTRAVENOUS
  Filled 2017-09-11: qty 25

## 2017-09-11 MED ORDER — SODIUM CHLORIDE 0.9% FLUSH
10.0000 mL | INTRAVENOUS | Status: DC | PRN
  Administered 2017-09-11: 10 mL
  Filled 2017-09-11: qty 10

## 2017-09-11 MED ORDER — DEXAMETHASONE SODIUM PHOSPHATE 10 MG/ML IJ SOLN
10.0000 mg | Freq: Once | INTRAMUSCULAR | Status: AC
  Administered 2017-09-11: 10 mg via INTRAVENOUS
  Filled 2017-09-11: qty 1

## 2017-09-11 MED ORDER — PALONOSETRON HCL INJECTION 0.25 MG/5ML
0.2500 mg | Freq: Once | INTRAVENOUS | Status: AC
  Administered 2017-09-11: 0.25 mg via INTRAVENOUS
  Filled 2017-09-11: qty 5

## 2017-09-11 MED ORDER — DEXAMETHASONE SODIUM PHOSPHATE 100 MG/10ML IJ SOLN: 10.0000 mg | Freq: Once | INTRAMUSCULAR | Status: DC

## 2017-09-12 LAB — CA 125: Cancer Antigen (CA) 125: 75.4 U/mL — ABNORMAL HIGH (ref 0.0–38.1)

## 2017-09-16 NOTE — Progress Notes (Signed)
[facility]  Telephone:(336) [phone] Fax:(336) [phone]  ID: Kathryn Chandler OB: Jun 23, 1929  MR#: [id number]  PPJ#:[id number]  Patient Care Team: Albina Billet, MD as PCP - General (Internal Medicine) Gillis Ends, MD as Referring Physician (Obstetrics and Gynecology) Ward, Honor Loh, MD as Referring Physician (Obstetrics and Gynecology) Lloyd Huger, MD as Consulting Physician (Oncology)  CHIEF COMPLAINT: Stage IIIc left ovarian cancer status post suboptimal debulking and chemotherapy.  INTERVAL HISTORY: Patient returns to clinic today for further evaluation and consideration of cycle 3, day 8 of carboplatinum and Abraxane.  Abraxane only today.  Her weakness and fatigue are progressing.  She continues to be depressed and minimally vocal. She has no further shortness of breath. She has a poor appetite, but  denies any further nausea.  She has no neurologic complaints. She denies any fevers. She denies any chest pain, cough, or hemoptysis. She has no nausea, vomiting, constipation, or diarrhea. She denies any abdominal bloating.  She has no urinary complaints. Patient offers no further specific complaints today.  REVIEW OF SYSTEMS:   Review of Systems  Constitutional: Positive for malaise/fatigue. Negative for fever and weight loss.  Eyes: Negative for blurred vision.  Respiratory: Negative.  Negative for cough and shortness of breath.   Cardiovascular: Negative.  Negative for chest pain and leg swelling.  Gastrointestinal: Negative for abdominal pain, blood in stool, constipation, diarrhea, melena, nausea and vomiting.  Genitourinary: Negative.   Musculoskeletal: Negative.   Skin: Negative.  Negative for rash.  Neurological: Positive for weakness. Negative for sensory change.  Psychiatric/Behavioral: Positive for depression. The patient is not nervous/anxious.     As per HPI. Otherwise, a complete review of systems is negative.  PAST MEDICAL  HISTORY: Past Medical History:  Diagnosis Date  . Anxiety   . Cancer ([place])   . GERD (gastroesophageal reflux disease)   . Glaucoma   . Hypercholesteremia   . Hypertension   . Ovarian cancer ([place]) 06/2015   Bilateral Oophorectomies.   Marland Kitchen PONV (postoperative nausea and vomiting)     PAST SURGICAL HISTORY: Past Surgical History:  Procedure Laterality Date  . ABDOMINAL HYSTERECTOMY    . BREAST BIOPSY Right 1970's  . EXPLORATORY LAPAROTOMY W/ BOWEL RESECTION  01/11/2016   XL optimal debulking rectosigmoid resection and reanastomosis  . HAND SURGERY     Right  . LAPAROTOMY Left 07/20/2015   Procedure: LAPAROTOMY with Left Salpingooophrectomy;  Surgeon: Honor Loh Ward, MD;  Location: [facility];  Service: Gynecology;  Laterality: Left;  . LAPAROTOMY N/A 07/20/2015   Procedure: LAPAROTOMY;  Surgeon: Gillis Ends, MD;  Location: [facility];  Service: Gynecology;  Laterality: N/A;  . PERIPHERAL VASCULAR CATHETERIZATION N/A 08/10/2015   Procedure: Glori Luis Cath Insertion;  Surgeon: Algernon Huxley, MD;  Location: [place] [facility];  Service: Cardiovascular;  Laterality: N/A;  . REPLACEMENT TOTAL KNEE     Right  . SALPINGOOPHORECTOMY Right 07/20/2015   Procedure: SALPINGO OOPHORECTOMY;  Surgeon: Gillis Ends, MD;  Location: [facility];  Service: Gynecology;  Laterality: Right;    FAMILY HISTORY: Family History  Problem Relation Age of Onset  . Stomach cancer Mother   . Osteoporosis Mother   . Kidney disease Father   . Breast cancer Neg Hx        ADVANCED DIRECTIVES:    HEALTH MAINTENANCE: Social History  Substance Use Topics  . Smoking status: Never Smoker  . Smokeless tobacco: Never Used  . Alcohol use No  Colonoscopy:  PAP:  Bone density:  Lipid panel:  Allergies  Allergen Reactions  . Clindamycin/Lincomycin Diarrhea and Other (See Comments)    Reaction:  Antibiotic colitis   . Codeine Anxiety    Current Outpatient Prescriptions  Medication Sig  Dispense Refill  . acetaminophen (TYLENOL) 500 MG tablet Take 500-1,000 mg by mouth every 6 (six) hours as needed for mild pain, fever or headache.     . bimatoprost (LUMIGAN) 0.01 % SOLN Place 1 drop into both eyes at bedtime.    . citalopram (CELEXA) 20 MG tablet Take 1 tablet (20 mg total) by mouth daily. 30 tablet 2  . dexamethasone (DECADRON) 4 MG tablet Take 1 tablet (4 mg total) by mouth daily. 30 tablet 1  . fluticasone (FLONASE) 50 MCG/ACT nasal spray Place 2 sprays into both nostrils daily as needed for rhinitis.     Marland Kitchen lidocaine-prilocaine (EMLA) cream Apply 1 application topically as needed (prior to accessing port).    . LORazepam (ATIVAN) 0.5 MG tablet Take 0.5 mg by mouth 2 (two) times daily.     . meloxicam (MOBIC) 15 MG tablet Take 15 mg by mouth daily.     Marland Kitchen omeprazole (PRILOSEC) 20 MG capsule Take 20 mg by mouth daily.     . ondansetron (ZOFRAN) 8 MG tablet Take 1 tablet (8 mg total) by mouth every 8 (eight) hours as needed for nausea or vomiting. 60 tablet 2  . simvastatin (ZOCOR) 20 MG tablet Take 20 mg by mouth at bedtime.     . timolol (TIMOPTIC) 0.5 % ophthalmic solution      No current facility-administered medications for this visit.    Facility-Administered Medications Ordered in Other Visits  Medication Dose Route Frequency Provider Last Rate Last Dose  . diphenhydrAMINE (BENADRYL) injection 12.5 mg  12.5 mg Intravenous Once Choksi, Delorise Shiner, MD      . methylPREDNISolone sodium succinate (SOLU-MEDROL) 125 mg/2 mL injection 100 mg  100 mg Intravenous Once Choksi, Janak, MD      . sodium chloride flush (NS) 0.9 % injection 10 mL  10 mL Intravenous PRN Charlaine Dalton R, MD   10 mL at 05/20/17 1227    OBJECTIVE: Vitals:   09/18/17 1052  BP: 108/74  Pulse: 93  Resp: 18  Temp: 98.3 F (36.8 C)     There is no height or weight on file to calculate BMI.    ECOG FS:2 - Symptomatic, <50% confined to bed  General: Thin, no acute distress. Eyes: Pink conjunctiva,  anicteric sclera. Lungs: Clear to auscultation bilaterally. Heart: Regular rate and rhythm. No rubs, murmurs, or gallops. Abdomen: Midline reducible hernia, nontender. Musculoskeletal: No edema, cyanosis, or clubbing. Neuro: Alert, answering all questions appropriately. Cranial nerves grossly intact. Skin: No rashes or petechiae noted. Psych: Flat affect.   LAB RESULTS:  Lab Results  Component Value Date   NA 134 (L) 09/18/2017   K 3.8 09/18/2017   CL 96 (L) 09/18/2017   CO2 29 09/18/2017   GLUCOSE 113 (H) 09/18/2017   BUN 38 (H) 09/18/2017   CREATININE 0.41 (L) 09/18/2017   CALCIUM 8.6 (L) 09/18/2017   PROT 5.9 (L) 09/18/2017   ALBUMIN 3.2 (L) 09/18/2017   AST 28 09/18/2017   ALT 33 09/18/2017   ALKPHOS 55 09/18/2017   BILITOT 1.9 (H) 09/18/2017   GFRNONAA >60 09/18/2017   GFRAA >60 09/18/2017    Lab Results  Component Value Date   WBC 8.1 09/18/2017   NEUTROABS 5.6 09/18/2017  HGB 11.1 (L) 09/18/2017   HCT 32.8 (L) 09/18/2017   MCV 89.1 09/18/2017   PLT 200 09/18/2017     STUDIES: No results found.  Oncology History: Patient previously underwent suboptimal debulking in September 2016. She then completed 3 cycles of carboplatinum, Abraxane, and Avastin between August 15, 2015 and October 31, 2015. She then received 5 total treatments of carboplatinum and Abraxane only between March 13, 2016 and Apr 13, 2016. Treatment was then discontinued per patient preference.   ASSESSMENT: Stage IIIc left ovarian cancer status post suboptimal debulking and chemotherapy.  PLAN:    1. Stage IIIc left ovarian cancer: See oncology history from previous provider as above. CT scan results from June 06, 2017 reviewed independently with concern for recurrent disease. Patient also had a paracentesis on the same day removing 2.9 L of peritoneal fluid. Her CA-125 is now trending down initially at 455, now at 75.4. Because it has been greater than 1 year since her last treatment,  will proceed with the same treatment she received previously which is carboplatinum and Abraxane on day 1 with Abraxane only on days 1, 8, and 15 with day 22 off. Proceed with cycle 3, day 8 of treatment today.  Abraxane only.  Return to clinic in 1 week for consideration of cycle 3, day 15.  Given patient's declining performance status, will consider discontinuing treatment altogether next week.  Plan to reimage at the conclusion of cycle 3. 2. Hypertension: Patient's blood pressure is within normal limits today. Monitor.  3. Nausea and vomiting: Resolved.  4. Weakness and fatigue: Multifactorial, including secondary to depression. 5. Poor appetite: Multifactorial, including secondary to depression. Consider dietary consult in the future. 6. Pleural effusion: Patient does not complain of shortness of breath today. Monitor. 7. Depression: Patient agreed to initiate Celexa 20 mg daily today.  8. Elevated bilirubin: Increased again today.  Abraxane has already been dose reduced, monitor.   Patient expressed understanding and was in agreement with this plan. She also understands that She can call clinic at any time with any questions, concerns, or complaints.     Lloyd Huger, MD   09/18/2017 11:20 AM

## 2017-09-18 ENCOUNTER — Encounter: Payer: Self-pay | Admitting: Oncology

## 2017-09-18 ENCOUNTER — Inpatient Hospital Stay (HOSPITAL_BASED_OUTPATIENT_CLINIC_OR_DEPARTMENT_OTHER): Payer: Medicare Other | Admitting: Oncology

## 2017-09-18 ENCOUNTER — Inpatient Hospital Stay: Payer: Medicare Other

## 2017-09-18 VITALS — BP 108/74 | HR 93 | Temp 98.3°F | Resp 18

## 2017-09-18 DIAGNOSIS — R630 Anorexia: Secondary | ICD-10-CM | POA: Diagnosis not present

## 2017-09-18 DIAGNOSIS — R5383 Other fatigue: Secondary | ICD-10-CM | POA: Diagnosis not present

## 2017-09-18 DIAGNOSIS — R531 Weakness: Secondary | ICD-10-CM | POA: Diagnosis not present

## 2017-09-18 DIAGNOSIS — K219 Gastro-esophageal reflux disease without esophagitis: Secondary | ICD-10-CM | POA: Diagnosis not present

## 2017-09-18 DIAGNOSIS — F419 Anxiety disorder, unspecified: Secondary | ICD-10-CM | POA: Diagnosis not present

## 2017-09-18 DIAGNOSIS — I10 Essential (primary) hypertension: Secondary | ICD-10-CM

## 2017-09-18 DIAGNOSIS — E7800 Pure hypercholesterolemia, unspecified: Secondary | ICD-10-CM

## 2017-09-18 DIAGNOSIS — Z800 Family history of malignant neoplasm of digestive organs: Secondary | ICD-10-CM | POA: Diagnosis not present

## 2017-09-18 DIAGNOSIS — C562 Malignant neoplasm of left ovary: Secondary | ICD-10-CM

## 2017-09-18 DIAGNOSIS — C569 Malignant neoplasm of unspecified ovary: Secondary | ICD-10-CM

## 2017-09-18 DIAGNOSIS — Z79899 Other long term (current) drug therapy: Secondary | ICD-10-CM | POA: Diagnosis not present

## 2017-09-18 DIAGNOSIS — F329 Major depressive disorder, single episode, unspecified: Secondary | ICD-10-CM | POA: Diagnosis not present

## 2017-09-18 LAB — CBC WITH DIFFERENTIAL/PLATELET
Basophils Absolute: 0 10*3/uL (ref 0–0.1)
Basophils Relative: 0 %
Eosinophils Absolute: 0 10*3/uL (ref 0–0.7)
Eosinophils Relative: 0 %
HCT: 32.8 % — ABNORMAL LOW (ref 35.0–47.0)
HEMOGLOBIN: 11.1 g/dL — AB (ref 12.0–16.0)
Lymphocytes Relative: 24 %
Lymphs Abs: 1.9 10*3/uL (ref 1.0–3.6)
MCH: 30.1 pg (ref 26.0–34.0)
MCHC: 33.7 g/dL (ref 32.0–36.0)
MCV: 89.1 fL (ref 80.0–100.0)
Monocytes Absolute: 0.5 10*3/uL (ref 0.2–0.9)
Monocytes Relative: 6 %
NEUTROS PCT: 70 %
Neutro Abs: 5.6 10*3/uL (ref 1.4–6.5)
Platelets: 200 10*3/uL (ref 150–440)
RBC: 3.68 MIL/uL — AB (ref 3.80–5.20)
RDW: 24.5 % — ABNORMAL HIGH (ref 11.5–14.5)
WBC: 8.1 10*3/uL (ref 3.6–11.0)

## 2017-09-18 LAB — COMPREHENSIVE METABOLIC PANEL
ALBUMIN: 3.2 g/dL — AB (ref 3.5–5.0)
ALT: 33 U/L (ref 14–54)
AST: 28 U/L (ref 15–41)
Alkaline Phosphatase: 55 U/L (ref 38–126)
Anion gap: 9 (ref 5–15)
BUN: 38 mg/dL — AB (ref 6–20)
CHLORIDE: 96 mmol/L — AB (ref 101–111)
CO2: 29 mmol/L (ref 22–32)
CREATININE: 0.41 mg/dL — AB (ref 0.44–1.00)
Calcium: 8.6 mg/dL — ABNORMAL LOW (ref 8.9–10.3)
GFR calc Af Amer: >60 mL/min (ref >60)
GFR calc non Af Amer: >60 mL/min (ref >60)
GLUCOSE: 113 mg/dL — AB (ref 65–99)
POTASSIUM: 3.8 mmol/L (ref 3.5–5.1)
Sodium: 134 mmol/L — ABNORMAL LOW (ref 135–145)
Total Bilirubin: 1.9 mg/dL — ABNORMAL HIGH (ref 0.3–1.2)
Total Protein: 5.9 g/dL — ABNORMAL LOW (ref 6.5–8.1)

## 2017-09-18 MED ORDER — SODIUM CHLORIDE 0.9% FLUSH
10.0000 mL | INTRAVENOUS | Status: DC | PRN
  Filled 2017-09-18: qty 10

## 2017-09-18 MED ORDER — SODIUM CHLORIDE 0.9 % IV SOLN
Freq: Once | INTRAVENOUS | Status: AC
  Administered 2017-09-18: 12:00:00 via INTRAVENOUS
  Filled 2017-09-18: qty 1000

## 2017-09-18 MED ORDER — PACLITAXEL PROTEIN-BOUND CHEMO INJECTION 100 MG
80.0000 mg/m2 | Freq: Once | INTRAVENOUS | Status: AC
  Administered 2017-09-18: 125 mg via INTRAVENOUS
  Filled 2017-09-18: qty 25

## 2017-09-18 MED ORDER — PROCHLORPERAZINE MALEATE 10 MG PO TABS
10.0000 mg | ORAL_TABLET | Freq: Once | ORAL | Status: AC
  Administered 2017-09-18: 10 mg via ORAL
  Filled 2017-09-18: qty 1

## 2017-09-18 MED ORDER — HEPARIN SOD (PORK) LOCK FLUSH 100 UNIT/ML IV SOLN
500.0000 [IU] | Freq: Once | INTRAVENOUS | Status: AC | PRN
Start: 2017-09-18 — End: 2017-09-18
  Administered 2017-09-18: 500 [IU]
  Filled 2017-09-18: qty 5

## 2017-09-18 NOTE — Progress Notes (Signed)
Patient here for follow up with labs and treatment with abraxane today. She is very weak and lethargic; she denies having any pain. She is unable to stand today for a weight check, and her son states that she has really gone down over the last several weeks.

## 2017-09-18 NOTE — Progress Notes (Signed)
Pt feeling poor today, not able to stand, per son and Dr Grayland Ormond ok to proceed with tx.

## 2017-09-19 LAB — CA 125: CANCER ANTIGEN (CA) 125: 65.1 U/mL — AB (ref 0.0–38.1)

## 2017-09-25 ENCOUNTER — Inpatient Hospital Stay: Payer: Medicare Other | Admitting: Oncology

## 2017-09-25 ENCOUNTER — Inpatient Hospital Stay: Payer: Medicare Other

## 2017-09-25 ENCOUNTER — Ambulatory Visit: Payer: Medicare Other

## 2017-09-29 NOTE — Progress Notes (Deleted)
[place]  [phone] [phone]  ID: Kathryn Chandler OB: June 04, 1929  MR#: [id number]  QIH#:[id number]  Patient Care Team: Albina Billet, MD as PCP - General (Internal Medicine) Gillis Ends, MD as Referring Physician (Obstetrics and Gynecology) Ward, Honor Loh, MD as Referring Physician (Obstetrics and Gynecology) Lloyd Huger, MD as Consulting Physician (Oncology)  CHIEF COMPLAINT: Stage IIIc left ovarian cancer status post suboptimal debulking and chemotherapy.  INTERVAL HISTORY: Patient returns to clinic today for further evaluation and consideration of cycle 3, day 8 of carboplatinum and Abraxane.  Abraxane only today.  Her weakness and fatigue are progressing.  She continues to be depressed and minimally vocal. She has no further shortness of breath. She has a poor appetite, but  denies any further nausea.  She has no neurologic complaints. She denies any fevers. She denies any chest pain, cough, or hemoptysis. She has no nausea, vomiting, constipation, or diarrhea. She denies any abdominal bloating.  She has no urinary complaints. Patient offers no further specific complaints today.  REVIEW OF SYSTEMS:   Review of Systems  Constitutional: Positive for malaise/fatigue. Negative for fever and weight loss.  Eyes: Negative for blurred vision.  Respiratory: Negative.  Negative for cough and shortness of breath.   Cardiovascular: Negative.  Negative for chest pain and leg swelling.  Gastrointestinal: Negative for abdominal pain, blood in stool, constipation, diarrhea, melena, nausea and vomiting.  Genitourinary: Negative.   Musculoskeletal: Negative.   Skin: Negative.  Negative for rash.  Neurological: Positive for weakness. Negative for sensory change.  Psychiatric/Behavioral: Positive for depression. The patient is not nervous/anxious.     As per HPI. Otherwise, a complete review of systems is negative.  PAST MEDICAL  HISTORY: Past Medical History:  Diagnosis Date  . Anxiety   . Cancer ([place])   . GERD (gastroesophageal reflux disease)   . Glaucoma   . Hypercholesteremia   . Hypertension   . Ovarian cancer ([place]) 06/2015   Bilateral Oophorectomies.   Marland Kitchen PONV (postoperative nausea and vomiting)     PAST SURGICAL HISTORY: Past Surgical History:  Procedure Laterality Date  . ABDOMINAL HYSTERECTOMY    . BREAST BIOPSY Right 1970's  . EXPLORATORY LAPAROTOMY W/ BOWEL RESECTION  01/11/2016   XL optimal debulking rectosigmoid resection and reanastomosis  . HAND SURGERY     Right  . REPLACEMENT TOTAL KNEE     Right    FAMILY HISTORY: Family History  Problem Relation Age of Onset  . Stomach cancer Mother   . Osteoporosis Mother   . Kidney disease Father   . Breast cancer Neg Hx        ADVANCED DIRECTIVES:    HEALTH MAINTENANCE: Social History   Tobacco Use  . Smoking status: Never Smoker  . Smokeless tobacco: Never Used  Substance Use Topics  . Alcohol use: No  . Drug use: No     Colonoscopy:  PAP:  Bone density:  Lipid panel:  Allergies  Allergen Reactions  . Clindamycin/Lincomycin Diarrhea and Other (See Comments)    Reaction:  Antibiotic colitis   . Codeine Anxiety    Current Outpatient Medications  Medication Sig Dispense Refill  . acetaminophen (TYLENOL) 500 MG tablet Take 500-1,000 mg by mouth every 6 (six) hours as needed for mild pain, fever or headache.     . bimatoprost (LUMIGAN) 0.01 % SOLN Place 1 drop into both eyes at bedtime.    . citalopram (CELEXA) 20 MG tablet Take 1 tablet (20  mg total) by mouth daily. 30 tablet 2  . dexamethasone (DECADRON) 4 MG tablet Take 1 tablet (4 mg total) by mouth daily. 30 tablet 1  . fluticasone (FLONASE) 50 MCG/ACT nasal spray Place 2 sprays into both nostrils daily as needed for rhinitis.     Marland Kitchen lidocaine-prilocaine (EMLA) cream Apply 1 application topically as needed (prior to accessing port).    . LORazepam (ATIVAN) 0.5 MG  tablet Take 0.5 mg by mouth 2 (two) times daily.     . meloxicam (MOBIC) 15 MG tablet Take 15 mg by mouth daily.     Marland Kitchen omeprazole (PRILOSEC) 20 MG capsule Take 20 mg by mouth daily.     . ondansetron (ZOFRAN) 8 MG tablet Take 1 tablet (8 mg total) by mouth every 8 (eight) hours as needed for nausea or vomiting. 60 tablet 2  . simvastatin (ZOCOR) 20 MG tablet Take 20 mg by mouth at bedtime.     . timolol (TIMOPTIC) 0.5 % ophthalmic solution      No current facility-administered medications for this visit.    Facility-Administered Medications Ordered in Other Visits  Medication Dose Route Frequency Provider Last Rate Last Dose  . diphenhydrAMINE (BENADRYL) injection 12.5 mg  12.5 mg Intravenous Once Choksi, Delorise Shiner, MD      . methylPREDNISolone sodium succinate (SOLU-MEDROL) 125 mg/2 mL injection 100 mg  100 mg Intravenous Once Choksi, Janak, MD      . sodium chloride flush (NS) 0.9 % injection 10 mL  10 mL Intravenous PRN Cammie Sickle, MD   10 mL at 05/20/17 1227    OBJECTIVE: There were no vitals filed for this visit.   There is no height or weight on file to calculate BMI.    ECOG FS:2 - Symptomatic, <50% confined to bed  General: Thin, no acute distress. Eyes: Pink conjunctiva, anicteric sclera. Lungs: Clear to auscultation bilaterally. Heart: Regular rate and rhythm. No rubs, murmurs, or gallops. Abdomen: Midline reducible hernia, nontender. Musculoskeletal: No edema, cyanosis, or clubbing. Neuro: Alert, answering all questions appropriately. Cranial nerves grossly intact. Skin: No rashes or petechiae noted. Psych: Flat affect.   LAB RESULTS:  Lab Results  Component Value Date   NA 134 (L) 09/18/2017   K 3.8 09/18/2017   CL 96 (L) 09/18/2017   CO2 29 09/18/2017   GLUCOSE 113 (H) 09/18/2017   BUN 38 (H) 09/18/2017   CREATININE 0.41 (L) 09/18/2017   CALCIUM 8.6 (L) 09/18/2017   PROT 5.9 (L) 09/18/2017   ALBUMIN 3.2 (L) 09/18/2017   AST 28 09/18/2017   ALT 33  09/18/2017   ALKPHOS 55 09/18/2017   BILITOT 1.9 (H) 09/18/2017   GFRNONAA >60 09/18/2017   GFRAA >60 09/18/2017    Lab Results  Component Value Date   WBC 8.1 09/18/2017   NEUTROABS 5.6 09/18/2017   HGB 11.1 (L) 09/18/2017   HCT 32.8 (L) 09/18/2017   MCV 89.1 09/18/2017   PLT 200 09/18/2017     STUDIES: No results found.  Oncology History: Patient previously underwent suboptimal debulking in September 2016. She then completed 3 cycles of carboplatinum, Abraxane, and Avastin between August 15, 2015 and October 31, 2015. She then received 5 total treatments of carboplatinum and Abraxane only between March 13, 2016 and Apr 13, 2016. Treatment was then discontinued per patient preference.   ASSESSMENT: Stage IIIc left ovarian cancer status post suboptimal debulking and chemotherapy.  PLAN:    1. Stage IIIc left ovarian cancer: See oncology history from previous provider  as above. CT scan results from June 06, 2017 reviewed independently with concern for recurrent disease. Patient also had a paracentesis on the same day removing 2.9 L of peritoneal fluid. Her CA-125 is now trending down initially at 455, now at 75.4. Because it has been greater than 1 year since her last treatment, will proceed with the same treatment she received previously which is carboplatinum and Abraxane on day 1 with Abraxane only on days 1, 8, and 15 with day 22 off. Proceed with cycle 3, day 8 of treatment today.  Abraxane only.  Return to clinic in 1 week for consideration of cycle 3, day 15.  Given patient's declining performance status, will consider discontinuing treatment altogether next week.  Plan to reimage at the conclusion of cycle 3. 2. Hypertension: Patient's blood pressure is within normal limits today. Monitor.  3. Nausea and vomiting: Resolved.  4. Weakness and fatigue: Multifactorial, including secondary to depression. 5. Poor appetite: Multifactorial, including secondary to depression.  Consider dietary consult in the future. 6. Pleural effusion: Patient does not complain of shortness of breath today. Monitor. 7. Depression: Patient agreed to initiate Celexa 20 mg daily today.  8. Elevated bilirubin: Increased again today.  Abraxane has already been dose reduced, monitor.   Patient expressed understanding and was in agreement with this plan. She also understands that She can call clinic at any time with any questions, concerns, or complaints.     Lloyd Huger, MD   09/29/2017 11:13 AM

## 2017-09-30 NOTE — Progress Notes (Deleted)
Gynecologic Oncology Interval Visit   Referring Provider: Dr. Hall Busing  Chief Concern: recurrent stage IIIC ovarian cancer, history of rectovaginal fistula s/p rectosigmoid resection and reanastomosis with optimal interval tumor debulking and chemotherapy.   Subjective:  Kathryn Chandler is a 83 y.o. female who is seen initially in consultation from Dr. Hall Busing with stage IIIC HGSC of the ovary.   ***09/11/2017 cycle 3, carboplatinum and Abraxane.    She was last seen in clinic on 06/05/2017 with symptoms concerning for recurrent disease. On CT scan she had CT abdomen showed large ascites.  IMPRESSION: 1. New moderate to large ascites with diffuse thickening, enhancement, and nodularity of the peritoneum, consistent with worsening metastatic disease given history of ovarian cancer. 2. New low-density 2.4 cm nodule which appears associated with the gastric wall near the pylorus, possibly representing a serosal implant. 3. Status post prior rectosigmoid colonic resection with primary reanastomosis, with new wall thickening of the residual rectum and infiltration of the presacral fat, which may reflect proctitis. 4. New moderate left and small right pleural effusions, incompletely Visualized.  She underwent US guided paracentesis with removal of 2.9 L of fluid and her symptoms improved.   She presents today to discuss management options.       Lab Results  Component Value Date   CA125 174.9 (H) 05/20/2017   CA125 191.4 (H) 04/17/2017   CA125 171.8 (H) 04/02/2017    Gynecologic Oncology History   Kathryn Chandler is a pleasant female who is seen initially in consultation from Dr. Hall Busing with suboptimally debulked stage IIIC HGSC. She underwent laparotomy and LSO with resection of pelvic mass on 07/20/2015 and has stage IIIC ovarian cancer. Her disease was not amendable to debulking to no gross residual. She would have required several bowel resections, diaphragm stripping, and liver  mobilization and due to peritoneal carcinomatosis would not have been debulked to R0. Preoperative CA125 74.2 and HE4=118  FINAL PATHOLOGY DIAGNOSIS:  A. LEFT OVARY; OOPHORECTOMY:  - HIGH-GRADE SEROUS CARCINOMA.  - FALLOPIAN TUBE NOT IDENTIFIED.  She has been treated with weekly taxane and carboplatin (initially paclitaxel but was switched to Abraxane on day 1 and 8 and 15). Bevacizumab was added cycles 2 and 3. She is tolerating her therapy with an excellent decrease in CA125 values. 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 She  status for status post XL on 01/11/2016 with RSO, enterolysis, infragastric omentectomy, mobilization of splenic flexure,  resection of rectosigmoid and repair of fistula, with optimal debulking to < 3 mm. Residual disease is miliary in nature involving small bowel mesentary. Pathology results c/w high grade serous adenocarcinoma except rectosigmoid colon, excision which revealed benign colonic tissue with diverticula.  She completed 5 cycles of carboplatinum and Abraxane on Apr 13, 2016. She declined further chemotherapy.   Rising CA125 in May 2017 confirmed x 2.     04/17/2016 CT scan A/P IMPRESSION: 1. Pancolitis c/w C difficile. 2. Ovarian cancer with omental metastases noted previously not clearly seen today. 3. Chronic findings are described above.  Given asymptomatic state plan for continued surveillance.   01/02/2017 CT scan C/A/P  1. No definitive evidence of metastatic disease. Mesenteric nodule in the anterior left lower quadrant may have been present on 07/04/2016. Continued attention on followup exams is warranted. 2. Aortic atherosclerosis (ICD10-170.0). Coronary artery calcification. 3. Mild subpleural reticulation in the lungs, likely stable and indicative of nonspecific interstitial pneumonitis. 4. Cholelithiasis.  01/02/2017 CT Head  1. No acute intracranial abnormality or  abnormal enhancement of the brain  identified. 2. Mild progression in moderate chronic microvascular ischemic changes and mild brain parenchymal volume loss from 2009.   Genetic testing BRCA mutation was negative   Problem List: Patient Active Problem List   Diagnosis Date Noted  . Osteoporosis 08/14/2017  . Goals of care, counseling/discussion 08/02/2017  . Pleural effusion 07/08/2017  . Abdominal pain 06/05/2017  . Renal failure 04/17/2016  . Protein-calorie malnutrition, severe ([place]) 11/07/2015  . Essential hypertension 11/05/2015  . Colovaginal fistula 11/04/2015  . Acid reflux 08/03/2015  . Cataracts, bilateral 08/03/2015  . Chickenpox 08/03/2015  . Allergic state 08/03/2015  . Breast lump 08/03/2015  . High cholesterol 08/03/2015  . Melanoma ([place]) 08/03/2015  . Ovarian cancer, unspecified laterality ([place]) 07/20/2015  . Head or neck swelling, mass, or lump 08/17/2014    Past Medical History: Past Medical History:  Diagnosis Date  . Anxiety   . Cancer ([place])   . GERD (gastroesophageal reflux disease)   . Glaucoma   . Hypercholesteremia   . Hypertension   . Ovarian cancer ([place]) 06/2015   Bilateral Oophorectomies.   Marland Kitchen PONV (postoperative nausea and vomiting)     Past Surgical History: Past Surgical History:  Procedure Laterality Date  . ABDOMINAL HYSTERECTOMY    . BREAST BIOPSY Right 1970's  . EXPLORATORY LAPAROTOMY W/ BOWEL RESECTION  01/11/2016   XL optimal debulking rectosigmoid resection and reanastomosis  . HAND SURGERY     Right  . REPLACEMENT TOTAL KNEE     Right    Past Gynecologic History:  Menopause: 1976 06/07/2014 Mammogram IMPRESSION: No mammographic evidence of malignancy. A result letter of this screening mammogram will be mailed directly to the patient.   OB History:  OB History  Gravida Para Term Preterm AB Living  3 1     2     SAB TAB Ectopic Multiple Live Births  2            # Outcome Date GA Lbr Len/2nd Weight Sex Delivery Anes PTL Lv  3 Para           2 SAB            1 SAB               Family History: Family History  Problem Relation Age of Onset  . Stomach cancer Mother   . Osteoporosis Mother   . Kidney disease Father   . Breast cancer Neg Hx     Social History: Social History   Socioeconomic History  . Marital status: Widowed    Spouse name: Not on file  . Number of children: Not on file  . Years of education: Not on file  . Highest education level: Not on file  Social Needs  . Financial resource strain: Not on file  . Food insecurity - worry: Not on file  . Food insecurity - inability: Not on file  . Transportation needs - medical: Not on file  . Transportation needs - non-medical: Not on file  Occupational History    Comment: Retired  Tobacco Use  . Smoking status: Never Smoker  . Smokeless tobacco: Never Used  Substance and Sexual Activity  . Alcohol use: No  . Drug use: No  . Sexual activity: No  Other Topics Concern  . Not on file  Social History Narrative  . Not on file    Allergies: Allergies  Allergen Reactions  . Clindamycin/Lincomycin Diarrhea and Other (See Comments)    Reaction:  Antibiotic colitis   . Codeine Anxiety    Current Medications: Current Outpatient Medications  Medication Sig Dispense Refill  . acetaminophen (TYLENOL) 500 MG tablet Take 500-1,000 mg by mouth every 6 (six) hours as needed for mild pain, fever or headache.     . bimatoprost (LUMIGAN) 0.01 % SOLN Place 1 drop into both eyes at bedtime.    . citalopram (CELEXA) 20 MG tablet Take 1 tablet (20 mg total) by mouth daily. 30 tablet 2  . dexamethasone (DECADRON) 4 MG tablet Take 1 tablet (4 mg total) by mouth daily. 30 tablet 1  . fluticasone (FLONASE) 50 MCG/ACT nasal spray Place 2 sprays into both nostrils daily as needed for rhinitis.     Marland Kitchen lidocaine-prilocaine (EMLA) cream Apply 1 application topically as needed (prior to accessing port).    . LORazepam (ATIVAN) 0.5 MG tablet Take 0.5 mg by mouth 2 (two) times daily.      . meloxicam (MOBIC) 15 MG tablet Take 15 mg by mouth daily.     Marland Kitchen omeprazole (PRILOSEC) 20 MG capsule Take 20 mg by mouth daily.     . ondansetron (ZOFRAN) 8 MG tablet Take 1 tablet (8 mg total) by mouth every 8 (eight) hours as needed for nausea or vomiting. 60 tablet 2  . simvastatin (ZOCOR) 20 MG tablet Take 20 mg by mouth at bedtime.     . timolol (TIMOPTIC) 0.5 % ophthalmic solution      No current facility-administered medications for this visit.    Facility-Administered Medications Ordered in Other Visits  Medication Dose Route Frequency Provider Last Rate Last Dose  . diphenhydrAMINE (BENADRYL) injection 12.5 mg  12.5 mg Intravenous Once Choksi, Delorise Shiner, MD      . methylPREDNISolone sodium succinate (SOLU-MEDROL) 125 mg/2 mL injection 100 mg  100 mg Intravenous Once Choksi, Janak, MD      . sodium chloride flush (NS) 0.9 % injection 10 mL  10 mL Intravenous PRN Cammie Sickle, MD   10 mL at 05/20/17 1227    Review of Systems General: weakness/fatigue  HEENT: no complaints  Lungs: cough  Cardiac: no complaints  GI: abdominal pain, nausea with intermittent episodes of emesis,decreased appetite.  GU: no pelvic pain  Musculoskeletal: no complaints  Extremities: no complaints  Skin: skin issues  Neuro: no complaints  Endocrine: no complaints  Psych: depression      Objective:  Physical Examination:  There were no vitals taken for this visit.   Exam deferred from 06/05/2017    General appearance: alert, cooperative and appears stated age HEENT:PERRLA, extra ocular movement intact and sclera clear, anicteric Abdomen: firm, nontender but distended. + distant bowel sounds. No rushes/tinkling appreciated. Non tympanic. Concern for ascites. No palpable masses. Superior incicional reducible hernia (6 cm) o/w well healed incision  Neurological exam reveals alert, oriented, normal speech, no focal findings or movement disorder noted. Lymphatic survey: negative Oxly, axillary,  and inguinal nodes Extremities: Negative for edema or cords Neuro: Grossly intact  Pelvic: exam chaperoned by nurse;  Vulva: normal appearing vulva with no masses, tenderness or lesions; Vagina: normal vagina with healing at the cuff; Adnexa: surgically absent; Uterus: surgically absent, vaginal cuff well healed; Cervix: absent; Rectal: not performed.    Assessment:  Kathryn Chandler is a 83 y.o. female diagnosed with stage IIIc high grade serous ovarian cancer s/p suboptimal debulking, chemotherapy s/p chemotherapy with weekly abraxane, carboplatin, and bevacizumab, bowel perforation/RV fistula,  interval optimally debulking with rectosigmoid resection and reanastomosis for rectovaginal  fistula and resumption of chemotherapy without bevacizumab. Platinum-sensitive recurrent disease based on imaging, CA125 and ascites 06/05/2017.   Diverticular disease. History of UTI. History of c.Diff.    Medical co-morbidities complicating care: prior abdominal surgery, frailty, performance status of 3..  Plan:   Problem List Items Addressed This Visit    None     A discussion was held with the patient and her accompanying family regarding options for management platinum-sensitive recurrent ovarian cancer. Reintroduction n of chemotherapy presently is one option, and I recommended therapy for symptom control, and potential for improved quality of life. She is interested in restarting chemotherapy. In the setting platinum-sensitive disease response rates to platinum-based chemotherapy range from 57.4 - 59%. Complete remission rates are approximately 15-20% with PFS ranges from 8.4 - 10.4 months. She is not a candidate for bevacizumab therapy due to prior GI perforation and close proximicity of peritoneal implant with pylorus. We discussed goals of care. She has completed [place]. She is DNAR and I provided a stop sign today. I am hopeful she will respond to therapy but prepared for if she  has progressive disease.    Dr. Grayland Ormond was able to see her today and arrange for her to restart chemotherapy next week.   She will return to clinic in 3 months for follow up with CA125 and exam.   Gillis Ends, MD    CC:  Larey Days, MD  Albina Billet, MD [address]   [place], [place] [address] [phone]

## 2017-10-02 ENCOUNTER — Inpatient Hospital Stay: Payer: Medicare Other

## 2017-10-02 ENCOUNTER — Inpatient Hospital Stay: Payer: Medicare Other | Admitting: Oncology

## 2017-10-19 DEATH — deceased

## 2017-10-25 ENCOUNTER — Other Ambulatory Visit: Payer: Self-pay | Admitting: Nurse Practitioner

## 2018-07-23 IMAGING — US US PARACENTESIS
1 series · 3 of 3 positions shown · non-contrast
Comparison: none

INDICATION: 88-year-old female, ovarian cancer and malignant ascites.

[Series 1: us paracentesis · 0.23mm/px · 3 of 3 slices shown]
[im 1/3]
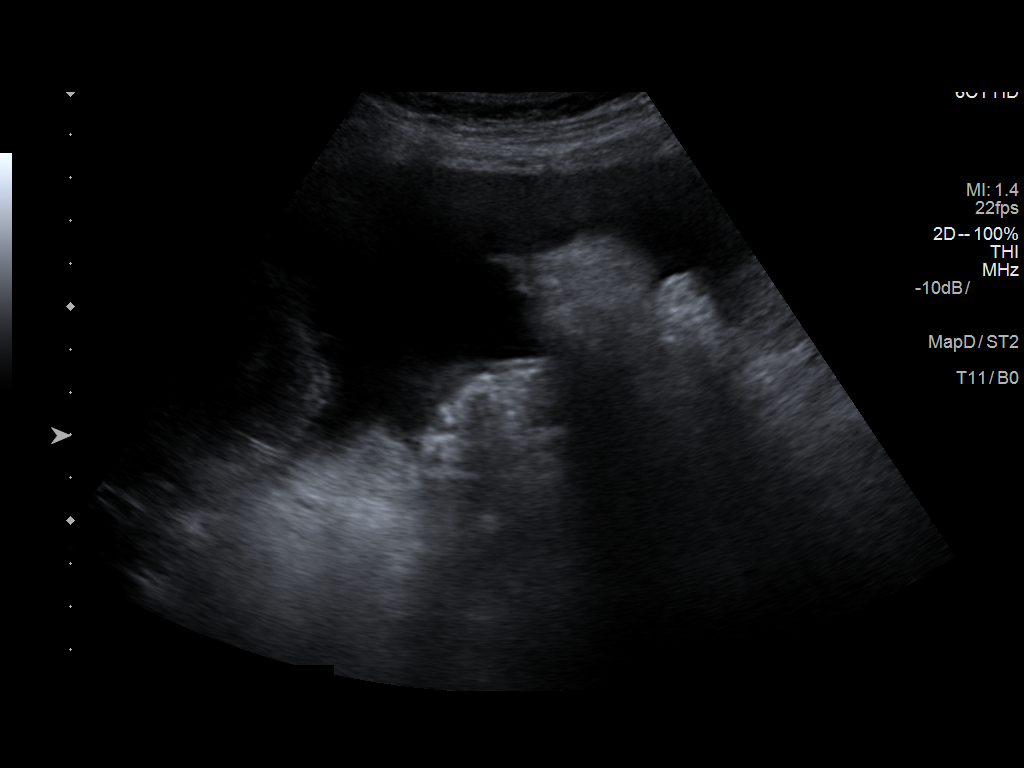
[im 2/3]
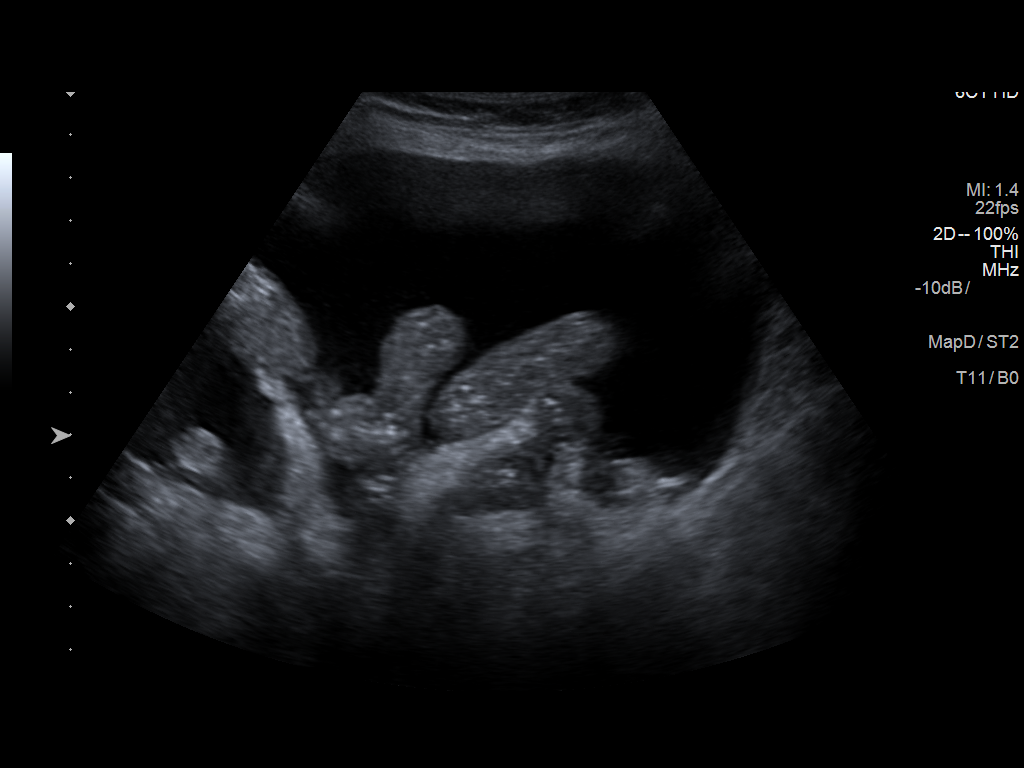
[im 3/3]
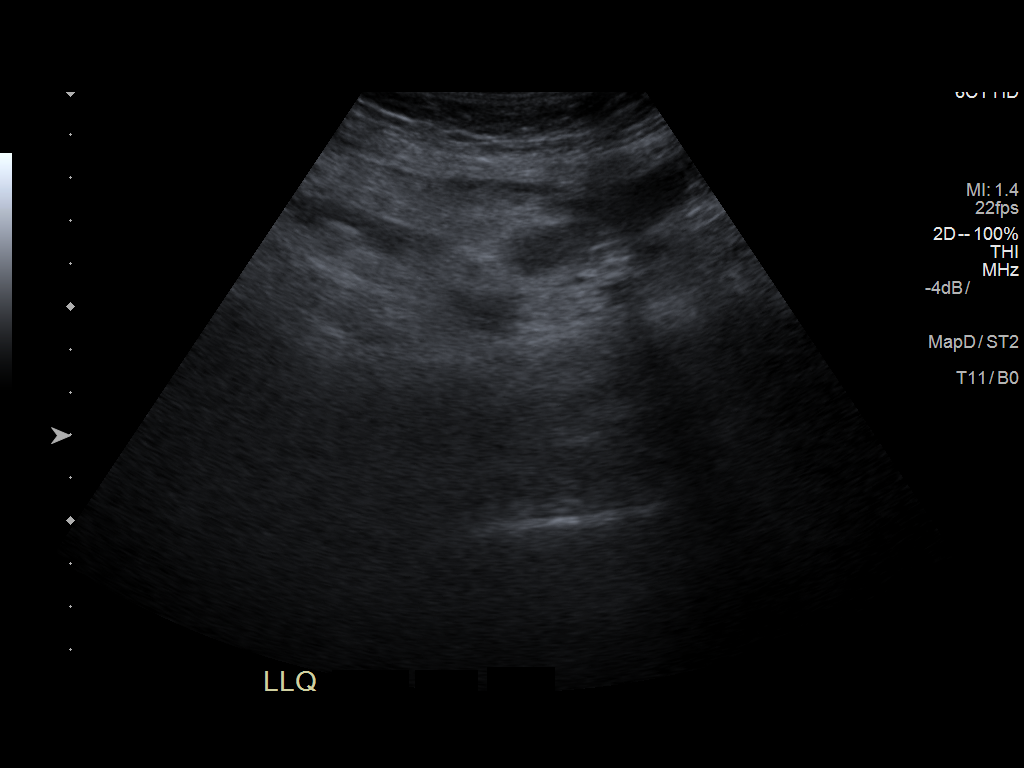

[3 of 3 positions shown; findings below may reference images not displayed]

EXAM:
ULTRASOUND GUIDED  PARACENTESIS

MEDICATIONS:
None.

COMPLICATIONS:
None immediate.

PROCEDURE:
Informed written consent was obtained from the patient after a
discussion of the risks, benefits and alternatives to treatment. A
timeout was performed prior to the initiation of the procedure.

Initial ultrasound scanning demonstrates a large amount of ascites
within the right lower abdominal quadrant. The right lower abdomen
was prepped and draped in the usual sterile fashion. 1% lidocaine
with epinephrine was used for local anesthesia.

Following this, a 6 Fr Safe-T-Centesis catheter was introduced. An
ultrasound image was saved for documentation purposes. The
paracentesis was performed. The catheter was removed and a dressing
was applied. The patient tolerated the procedure well without
immediate post procedural complication.
FINDINGS: A total of approximately 4677 mL of golden ascitic fluid was
removed. Samples were sent to the laboratory as requested by the
clinical team.
IMPRESSION: Successful ultrasound-guided paracentesis yielding 2.9 liters of
peritoneal fluid.

## 2018-08-24 IMAGING — US US THORACENTESIS ASP PLEURAL SPACE W/IMG GUIDE
1 series · 1 of 1 positions shown · non-contrast
Comparison: none

INDICATION: Left pleural effusion

[Series 1: us thoracentesis asp pleural space w/img guide · 0.32mm/px · 1 of 1 slices shown]
[im 1/1]
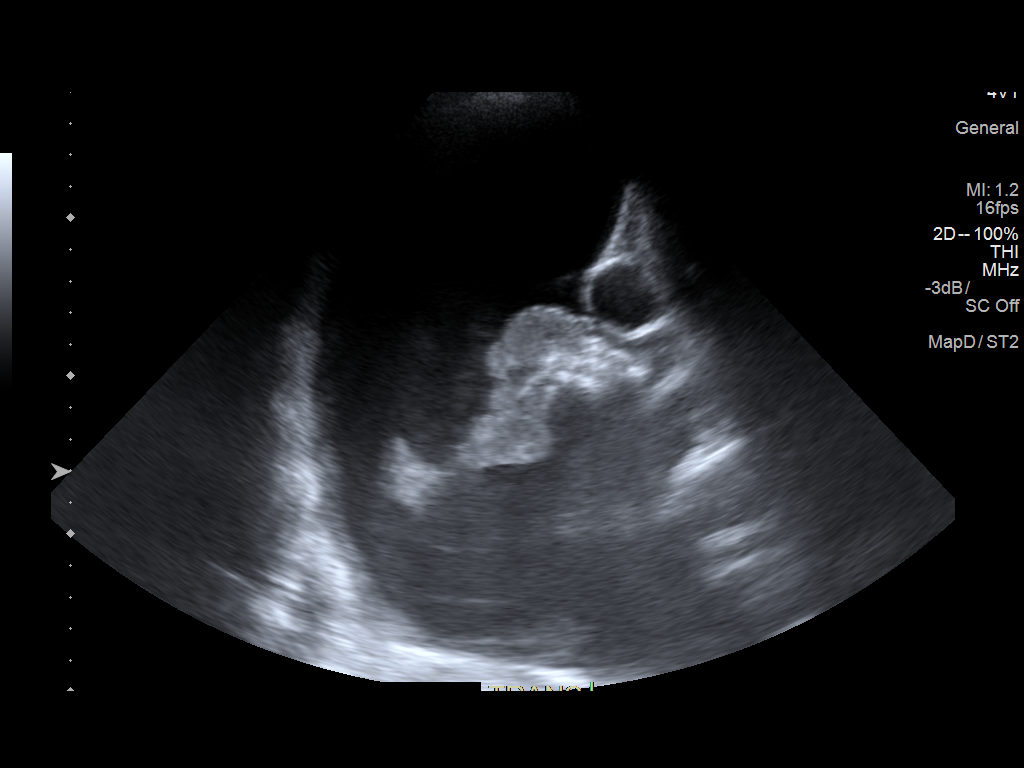

[1 of 1 positions shown; findings below may reference images not displayed]

EXAM:
ULTRASOUND GUIDED LEFT THORACENTESIS

MEDICATIONS:
None.

COMPLICATIONS:
None immediate.

PROCEDURE:
An ultrasound guided thoracentesis was thoroughly discussed with the
patient and questions answered. The benefits, risks, alternatives
and complications were also discussed. The patient understands and
wishes to proceed with the procedure. Written consent was obtained.

Ultrasound was performed to localize and mark an adequate pocket of
fluid in the left chest. The area was then prepped and draped in the
normal sterile fashion. 1% Lidocaine was used for local anesthesia.
Under ultrasound guidance a 6 Fr Safe-T-Centesis catheter was
introduced. Thoracentesis was performed. The catheter was removed
and a dressing applied.
FINDINGS: A total of approximately 1 L of clear yellow fluid was removed.
Samples were sent to the laboratory as requested by the clinical
team.
IMPRESSION: Successful ultrasound guided left thoracentesis yielding 1 L of
pleural fluid.
